# Patient Record
Sex: Male | Born: 1975 | Race: White | Hispanic: No | Marital: Single | Smoking: Never smoker
Health system: Southern US, Community
[De-identification: ages and names within clinical notes are randomized; demographics above are authoritative.]

## PROBLEM LIST (undated history)

## (undated) DIAGNOSIS — I2510 Atherosclerotic heart disease of native coronary artery without angina pectoris: Secondary | ICD-10-CM

## (undated) DIAGNOSIS — R569 Unspecified convulsions: Secondary | ICD-10-CM

## (undated) DIAGNOSIS — I10 Essential (primary) hypertension: Secondary | ICD-10-CM

## (undated) DIAGNOSIS — D496 Neoplasm of unspecified behavior of brain: Secondary | ICD-10-CM

## (undated) DIAGNOSIS — I219 Acute myocardial infarction, unspecified: Secondary | ICD-10-CM

## (undated) DIAGNOSIS — E785 Hyperlipidemia, unspecified: Secondary | ICD-10-CM

## (undated) HISTORY — DX: Unspecified convulsions: R56.9

## (undated) HISTORY — DX: Acute myocardial infarction, unspecified: I21.9

## (undated) HISTORY — DX: Hyperlipidemia, unspecified: E78.5

## (undated) HISTORY — PX: BRAIN SURGERY: SHX531

---

## 2005-07-20 ENCOUNTER — Ambulatory Visit (HOSPITAL_COMMUNITY): Admission: RE | Admit: 2005-07-20 | Discharge: 2005-07-20 | Payer: Self-pay | Admitting: Neurology

## 2010-04-02 ENCOUNTER — Encounter: Payer: Self-pay | Admitting: Neurology

## 2011-05-14 ENCOUNTER — Ambulatory Visit (INDEPENDENT_AMBULATORY_CARE_PROVIDER_SITE_OTHER): Payer: BC Managed Care – PPO | Admitting: Family Medicine

## 2011-05-14 ENCOUNTER — Ambulatory Visit: Payer: BC Managed Care – PPO

## 2011-05-14 VITALS — BP 130/80 | HR 120 | Temp 98.2°F | Resp 20 | Ht 65.5 in | Wt 183.0 lb

## 2011-05-14 DIAGNOSIS — R05 Cough: Secondary | ICD-10-CM

## 2011-05-14 DIAGNOSIS — R059 Cough, unspecified: Secondary | ICD-10-CM

## 2011-05-14 DIAGNOSIS — J189 Pneumonia, unspecified organism: Secondary | ICD-10-CM

## 2011-05-14 LAB — POCT CBC
Granulocyte percent: 75.2 %G (ref 37–80)
HCT, POC: 49.8 % (ref 43.5–53.7)
Lymph, poc: 2.4 (ref 0.6–3.4)
MCV: 87.4 fL (ref 80–97)
MID (cbc): 0.7 (ref 0–0.9)
MPV: 8.7 fL (ref 0–99.8)
POC MID %: 5.8 %M (ref 0–12)
Platelet Count, POC: 359 10*3/uL (ref 142–424)
RBC: 5.7 M/uL (ref 4.69–6.13)
WBC: 12.5 10*3/uL — AB (ref 4.6–10.2)

## 2011-05-14 MED ORDER — AZITHROMYCIN 500 MG PO TABS: 500.0000 mg | ORAL_TABLET | Freq: Every day | ORAL | Status: AC

## 2011-05-14 NOTE — Progress Notes (Signed)
Subjective:    Patient ID: Paul Duncan, male    DOB: [birth year], 36 y.o.   MRN: [id number]  HPI Paul Duncan is a 36 y.o. male Started with cold symptoms  1 week ago.- chest congestion, fever 100.5 - 101 next few days.,  Was feeling better past few days, then worse this am - more fatigue, chest congestion and cough.  No return of fever.  Breath a little shallower.  Tx: mucinex DM.  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  Last MRI 1/11.  Next appt. With radiation oncologist at [facility] March 20th. BP's usually in 140 range when has MRI, but no meds.  Engineer - Contractor.   Review of Systems  Constitutional: Positive for fever and chills.       Chills today, but last fever 6 days ago.  HENT: Negative for ear pain, congestion, sore throat, rhinorrhea, neck stiffness, postnasal drip, sinus pressure and ear discharge.        Slight decreased taste.  Respiratory: Positive for cough and chest tightness.        Chest congestion and tight with cough.  Cardiovascular: Negative for chest pain.  Gastrointestinal:       Occasional reflux after eating past few months.  Skin: Negative for rash.  Neurological: Negative for seizures, weakness and headaches.       Objective:   Physical Exam  Constitutional: He is oriented to person, place, and time. He appears well-developed and well-nourished.  HENT:  Head: Normocephalic and atraumatic.  Right Ear: External ear normal.  Left Ear: External ear normal.  Mouth/Throat: Oropharynx is clear and moist.  Eyes: Pupils are equal, round, and reactive to light.  Neck: Neck supple.  Cardiovascular: Regular rhythm, normal heart sounds and intact distal pulses.  Tachycardia present.   Pulmonary/Chest: Effort normal and breath sounds normal. No accessory muscle usage. Not tachypneic. No respiratory distress.  Abdominal: Soft. There is no tenderness.  Musculoskeletal: Normal range of motion.  Neurological: He is alert  and oriented to person, place, and time.  Skin: Skin is warm and dry.  Psychiatric: He has a normal mood and affect. His behavior is normal.   Results for orders placed in visit on 05/14/11  POCT CBC      Component Value Range   WBC 12.5 (*) 4.6 - 10.2 (K/uL)   Lymph, poc 2.4  0.6 - 3.4    POC LYMPH PERCENT 19.0  10 - 50 (%L)   MID (cbc) 0.7  0 - 0.9    POC MID % 5.8  0 - 12 (%M)   POC Granulocyte 9.4 (*) 2 - 6.9    Granulocyte percent 75.2  37 - 80 (%G)   RBC 5.70  4.69 - 6.13 (M/uL)   Hemoglobin 16.8  14.1 - 18.1 (g/dL)   HCT, POC 78.2  95.6 - 53.7 (%)   MCV 87.4  80 - 97 (fL)   MCH, POC 29.5  27 - 31.2 (pg)   MCHC 33.7  31.8 - 35.4 (g/dL)   RDW, POC 21.3     Platelet Count, POC 359  142 - 424 (K/uL)   MPV 8.7  0 - 99.8 (fL)     [facility] reading (PRIMARY) by  Dr. Neva Seat: CXR:  Increased left lingular, RML markings        Assessment & Plan:  Paul Duncan is a 36 y.o. male  Suspected L lingular pneumonia, after viral illness last week. Start azithro 500mg  qd x  5, mucinex 1-2 BID prn, fluids, rest,  rtc precautions discussed, including increased shortness of breath or fever in 3 days.    Recheck BP wnl.  likely white coat component.  Hx oligodendroglioma - s/p surgery 2007.  Denies recent headache, and no seizure activity.  Has follow up scheduled in few weeks.

## 2011-05-19 ENCOUNTER — Emergency Department (HOSPITAL_COMMUNITY): Payer: BC Managed Care – PPO

## 2011-05-19 ENCOUNTER — Encounter (HOSPITAL_COMMUNITY): Payer: Self-pay

## 2011-05-19 ENCOUNTER — Emergency Department (HOSPITAL_COMMUNITY)
Admission: EM | Admit: 2011-05-19 | Discharge: 2011-05-19 | Disposition: A | Discharge status: Home / Self Care | Payer: BC Managed Care – PPO | Attending: Emergency Medicine | Admitting: Emergency Medicine

## 2011-05-19 ENCOUNTER — Other Ambulatory Visit: Payer: Self-pay

## 2011-05-19 DIAGNOSIS — F411 Generalized anxiety disorder: Secondary | ICD-10-CM | POA: Insufficient documentation

## 2011-05-19 DIAGNOSIS — R079 Chest pain, unspecified: Secondary | ICD-10-CM | POA: Insufficient documentation

## 2011-05-19 DIAGNOSIS — F419 Anxiety disorder, unspecified: Secondary | ICD-10-CM

## 2011-05-19 HISTORY — DX: Neoplasm of unspecified behavior of brain: D49.6

## 2011-05-19 LAB — DIFFERENTIAL
Basophils Absolute: 0.1 10*3/uL (ref 0.0–0.1)
Basophils Relative: 0 % (ref 0–1)
Eosinophils Absolute: 0 10*3/uL (ref 0.0–0.7)
Monocytes Relative: 10 % (ref 3–12)
Neutro Abs: 9.8 10*3/uL — ABNORMAL HIGH (ref 1.7–7.7)
Neutrophils Relative %: 72 % (ref 43–77)

## 2011-05-19 LAB — CBC
MCH: 29.6 pg (ref 26.0–34.0)
MCHC: 36.2 g/dL — ABNORMAL HIGH (ref 30.0–36.0)
Platelets: 392 10*3/uL (ref 150–400)
RDW: 12.2 % (ref 11.5–15.5)

## 2011-05-19 LAB — BASIC METABOLIC PANEL
BUN: 15 mg/dL (ref 6–23)
Chloride: 103 mEq/L (ref 96–112)
GFR calc Af Amer: >90 mL/min (ref >90)
GFR calc non Af Amer: >90 mL/min (ref >90)
Potassium: 2.9 mEq/L — ABNORMAL LOW (ref 3.5–5.1)

## 2011-05-19 LAB — TROPONIN I: Troponin I: <0.3 ng/mL (ref <0.30)

## 2011-05-19 MED ORDER — ASPIRIN 81 MG PO CHEW
324.0000 mg | CHEWABLE_TABLET | Freq: Once | ORAL | Status: AC
  Administered 2011-05-19: 324 mg via ORAL
  Filled 2011-05-19: qty 4

## 2011-05-19 NOTE — Discharge Instructions (Signed)
Please read and follow all provided instructions.  Your diagnoses today include:  1. Anxiety   2. Chest pain     Tests performed today include:  An EKG of your heart  A chest x-ray  Cardiac enzymes - a blood test for heart muscle damage  Blood counts and electrolytes  Vital signs. See below for your results today.   Medications prescribed:  Start taking 81mg  aspirin a day if you can tolerate it and have not already done so.   Take any prescribed medications only as directed.  Follow-up instructions: Please follow-up with your primary care provider as soon as you can for further evaluation of your symptoms. If you do not have a primary care doctor -- see below for referral information.   Return instructions:  SEEK IMMEDIATE MEDICAL ATTENTION IF:  You have severe chest pain, especially if the pain is crushing or pressure-like and spreads to the arms, back, neck, or jaw, or if you have sweating, nausea (feeling sick to your stomach), or shortness of breath. THIS IS AN EMERGENCY. Don't wait to see if the pain will go away. Get medical help at once. Call 911 or 0 (operator). DO NOT drive yourself to the hospital.   Your chest pain gets worse and does not go away with rest.   You have an attack of chest pain lasting longer than usual, despite rest and treatment with the medications your caregiver has prescribed.   You wake from sleep with chest pain or shortness of breath.  You feel dizzy or faint.  You have chest pain not typical of your usual pain for which you originally saw your caregiver.   You have any other emergent concerns regarding your health.  Additional Information: Chest pain comes from many different causes. Your caregiver has diagnosed you as having chest pain that is not specific for one problem, but does not require admission.  You are at low risk for an acute heart condition or other serious illness.   Your vital signs today were: BP 122/77  Pulse 77   Temp(Src) 98.1 F (36.7 C) (Oral)  Resp 24  SpO2 99% If your blood pressure (BP) was elevated above 135/85 this visit, please have this repeated by your doctor within one month. -------------- No Primary Care Doctor Call [facility]  [phone] Other agencies that provide inexpensive medical care    [facility]  [phone]    [facility]  [phone]    [facility]  [phone]    [facility]  [phone]    [facility]  [phone]    [facility]  [phone] -------------- RESOURCE GUIDE:  Dental Problems  Patients with Medicaid: [facility] [phone]                                            [id number] [employer] Phone:  [phone]                                                   Phone:  [phone]  If unable to pay  or uninsured, contact:  [facility] or [facility]. to become qualified for the adult dental clinic.  Chronic Pain Problems Contact [facility]  [phone] Patients need to be referred by their primary care doctor.  Insufficient Money for Medicine Contact [employer]:  call "211" or [facility] [phone].  Psychological Services [facility]  [phone] [facility]  [phone] [facility]   [phone] (emergency services [phone])  Substance Abuse Resources [facility]  [phone] [facility] [phone] [facility] [phone] Floydene Flock [phone] Residential & Outpatient Substance Abuse Program  [phone]  Abuse/Neglect [facility] Child Abuse Hotline 6036699885 [facility] Child Abuse Hotline [phone] (After Hours)  Emergency Shelter [facility] [phone]  Maternity Homes [facility] [phone] [place] [facility] [phone]  [facility]  Resources  [facility]     [employer]                          [facility] Dept. [address] [place]                       [address]      [address]  Blondell Reveal Phone:  [phone]                                   Phone:  [phone]                 Phone:  [phone]  [facility] Mental Health Phone:  [phone]  [facility] Child Abuse Hotline [phone] (After Hours)

## 2011-05-19 NOTE — ED Provider Notes (Signed)
History     CSN: [id number]  Arrival date & time 05/19/11  4098   First MD Initiated Contact with Patient 05/19/11 1836      Chief Complaint  Patient presents with  . Anxiety    (Consider location/radiation/quality/duration/timing/severity/associated sxs/prior treatment) Patient is a 36 y.o. male presenting with anxiety. The history is provided by the patient.  Anxiety   he was watching television at 1730 when he developed a warm feeling in the left side of his chest associated with a sense of his heart was racing and some numbness in his left arm and leg. He denies any right-sided numbness. There was some dry mouth. He denies any facial numbness. Symptoms lasted about 30 minutes before resolving. Nothing made it better nothing made it worse. He did not try any treatment other than drinking some water. He rates the discomfort in his chest at 2/10 at its worst. He is symptom-free at this point. He denies dyspnea, nausea, vomiting, diaphoresis. He has been on azithromycin for 5 days for pneumonia and took his last dose yesterday. He continues to have a slight cough, but no fever or chills. His only cardiac risk factor is family history in that his father had coronary artery bypass surgery when he was 59. Patient is a nonsmoker and denies history of hypertension, diabetes, hyperlipidemia.   Past Medical History  Diagnosis Date  . Brain tumor     Past Surgical History  Procedure Date  . Brain surgery     History reviewed. No pertinent family history.  History  Substance Use Topics  . Smoking status: Never Smoker   . Smokeless tobacco: Not on file  . Alcohol Use: No      Review of Systems  All other systems reviewed and are negative.    Allergies  Review of patient's allergies indicates no known allergies.  Home Medications  No current outpatient prescriptions on file.  BP 147/80  Temp(Src) 98.1 F (36.7 C) (Oral)  Resp 24  SpO2 99%  Physical Exam  Nursing note  and vitals reviewed.  36 year old male who is resting comfortably and in no acute distress. Vital signs show mild systolic hypertension with blood pressure 147/80, and mild tachypnea with respiratory rate of 24. Oxygen saturation is 99% which is normal. Head is normocephalic and atraumatic. PERRLA, EOMI. Oropharynx is clear. Neck is nontender and supple without adenopathy or JVD. Lungs are clear without rales, wheezes, rhonchi. Back is nontender. Heart has regular rate rhythm without murmur. Abdomen is soft, flat, nontender without masses or hepatomegaly impression loss is present. Extremities have full range of motion, no cyanosis or edema. Skin is warm and dry without rash. Neurologic: Mental status is normal, cranial nerves are intact, there no focal motor or sensory deficits.  ED Course  Procedures (including critical care time)  Results for orders placed during the hospital encounter of 05/19/11  CBC      Component Value Range   WBC 13.6 (*) 4.0 - 10.5 (K/uL)   RBC 5.85 (*) 4.22 - 5.81 (MIL/uL)   Hemoglobin 17.3 (*) 13.0 - 17.0 (g/dL)   HCT 11.9  14.7 - 82.9 (%)   MCV 81.7  78.0 - 100.0 (fL)   MCH 29.6  26.0 - 34.0 (pg)   MCHC 36.2 (*) 30.0 - 36.0 (g/dL)   RDW 56.2  13.0 - 86.5 (%)   Platelets 392  150 - 400 (K/uL)  DIFFERENTIAL      Component Value Range   Neutrophils Relative 72  43 - 77 (%)   Neutro Abs 9.8 (*) 1.7 - 7.7 (K/uL)   Lymphocytes Relative 18  12 - 46 (%)   Lymphs Abs 2.4  0.7 - 4.0 (K/uL)   Monocytes Relative 10  3 - 12 (%)   Monocytes Absolute 1.3 (*) 0.1 - 1.0 (K/uL)   Eosinophils Relative 0  0 - 5 (%)   Eosinophils Absolute 0.0  0.0 - 0.7 (K/uL)   Basophils Relative 0  0 - 1 (%)   Basophils Absolute 0.1  0.0 - 0.1 (K/uL)  BASIC METABOLIC PANEL      Component Value Range   Sodium 140  135 - 145 (mEq/L)   Potassium 2.9 (*) 3.5 - 5.1 (mEq/L)   Chloride 103  96 - 112 (mEq/L)   CO2 21  19 - 32 (mEq/L)   Glucose, Bld 96  70 - 99 (mg/dL)   BUN 15  6 - 23 (mg/dL)     Creatinine, Ser 1.61  0.50 - 1.35 (mg/dL)   Calcium 9.4  8.4 - 09.6 (mg/dL)   GFR calc non Af Amer >90  >90 (mL/min)   GFR calc Af Amer >90  >90 (mL/min)  TROPONIN I      Component Value Range   Troponin I <0.30  <0.30 (ng/mL)  TROPONIN I      Component Value Range   Troponin I <0.30  <0.30 (ng/mL)   Dg Chest 2 View  05/19/2011  *RADIOLOGY REPORT*  Clinical Data: Chest pain.  Tachycardia.  Anxiety.  CHEST - 2 VIEW  Comparison: None.  Findings: Normal sized heart.  Clear lungs.  Mild diffuse peribronchial thickening.  Minimal scoliosis and minimal thoracic spine degenerative changes.  IMPRESSION: Mild chronic bronchitic changes.  Original Report Authenticated By: Darrol Angel, M.D.     ECG shows normal sinus rhythm with a rate of 83, no ectopy. Normal axis. Normal P wave. Normal QRS. Normal intervals. Normal ST and T waves. Impression: Poor R-wave progression across the precordium, otherwise normal ECG. No old ECG for available for comparison.  He has been symptom-free in the emergency department. He will be sent to CDU to await a three-hour troponin and discharged if negative.  1. Anxiety   2. Chest pain       MDM  Chest discomfort and tachycardia with numbness-I suspect that this was an episode of hyperventilation. He has a history of having an  oligodendroglioma removed from the right side of his brain about 5 years ago. I doubt significant cardiac disease. Cardiac markers will be checked and repeated in 3 hours.        Dione Booze, MD 05/20/11 1452

## 2011-05-19 NOTE — ED Provider Notes (Signed)
History     CSN: [id number]  Arrival date & time 05/19/11  8295   First MD Initiated Contact with Patient 05/19/11 1836      Chief Complaint  Patient presents with  . Anxiety    (Consider location/radiation/quality/duration/timing/severity/associated sxs/prior treatment) HPI  Past Medical History  Diagnosis Date  . Brain tumor     Past Surgical History  Procedure Date  . Brain surgery     History reviewed. No pertinent family history.  History  Substance Use Topics  . Smoking status: Never Smoker   . Smokeless tobacco: Not on file  . Alcohol Use: No      Review of Systems  Allergies  Review of patient's allergies indicates no known allergies.  Home Medications  No current outpatient prescriptions on file.  BP 145/96  Pulse 79  Temp(Src) 98.1 F (36.7 C) (Oral)  Resp 24  SpO2 100%  Physical Exam  ED Course  Procedures (including critical care time)  Labs Reviewed  CBC - Abnormal; Notable for the following:    WBC 13.6 (*)    RBC 5.85 (*)    Hemoglobin 17.3 (*)    MCHC 36.2 (*)    All other components within normal limits  DIFFERENTIAL - Abnormal; Notable for the following:    Neutro Abs 9.8 (*)    Monocytes Absolute 1.3 (*)    All other components within normal limits  BASIC METABOLIC PANEL - Abnormal; Notable for the following:    Potassium 2.9 (*)    All other components within normal limits  TROPONIN I  TROPONIN I   Dg Chest 2 View  05/19/2011  *RADIOLOGY REPORT*  Clinical Data: Chest pain.  Tachycardia.  Anxiety.  CHEST - 2 VIEW  Comparison: None.  Findings: Normal sized heart.  Clear lungs.  Mild diffuse peribronchial thickening.  Minimal scoliosis and minimal thoracic spine degenerative changes.  IMPRESSION: Mild chronic bronchitic changes.  Original Report Authenticated By: Darrol Angel, M.D.     1. Anxiety   2. Chest pain     7:25 PM patient to CDU from the blue side -- is awaiting 3 hour troponin. Patient had an episode of  hyperventilation and had some left sided facial and arm numbness with chest pain. Recently treated for pneumonia. Plan: Patient is low risk for ACS and can be discharged to home if second troponin is negative. Patient will need PCP referrals.  7:59 PM patient seen and examined. No history of hypertension, hypercholesterolemia, diabetes, smoking. Patient's father had bypass surgery at age 19. Currently no chest pain. Episode of chest pain lasting 20-30 minutes and then resolved. Exam:  Gen NAD; Heart RRR, nml S1,S2, no m/r/g; Lungs CTAB; Abd soft, NT, no rebound or guarding; Ext 2+ pedal pulses bilaterally, no edema.  11:11 PM Two sets of cardiac enzymes are negative. Patient informed. Patient counseled to begin taking 81 mg baby aspirin daily.  Primary care referrals given. Urged followup with primary care physician as soon as possible.  Patient was counseled to return with severe chest pain, especially if the pain is crushing or pressure-like and spreads to the arms, back, neck, or jaw, or if they have sweating, nausea, or shortness of breath with the pain. They were encouraged to call 911 with these symptoms.   They were also told to return if their chest pain gets worse and does not go away with rest, they have an attack of chest pain lasting longer than usual despite rest and treatment with the  medications their caregiver has prescribed, if they wake from sleep with chest pain or shortness of breath, if they feel dizzy or faint, if they have chest pain not typical of their usual pain, or if they have any other emergent concerns regarding their health.  The patient verbalized understanding and agreed.   MDM  Low risk chest pain, work-up is benign. PCP follow-up indicated.         Renne Crigler, [place] 05/19/11 2314

## 2011-05-19 NOTE — ED Notes (Signed)
Pt was watching TV and felt warmness in left side and numbness in arms and face.  Has been on z-pack x 1 week for possible dx of pneumonia.  Pt alert, anxious on arrival to ED

## 2011-05-19 NOTE — ED Notes (Signed)
Patient is resting comfortably. 

## 2011-05-19 NOTE — ED Notes (Signed)
Pt returned from xray

## 2011-05-20 NOTE — ED Provider Notes (Signed)
Mr. Paul Duncan was only involved for their CDU care the patient. I was available for consultation at all times.  Dione Booze, MD 05/20/11 [phone]

## 2011-09-03 DIAGNOSIS — D496 Neoplasm of unspecified behavior of brain: Secondary | ICD-10-CM | POA: Insufficient documentation

## 2012-04-25 ENCOUNTER — Emergency Department: Payer: Self-pay | Admitting: Emergency Medicine

## 2012-08-18 ENCOUNTER — Ambulatory Visit (INDEPENDENT_AMBULATORY_CARE_PROVIDER_SITE_OTHER): Payer: Managed Care, Other (non HMO) | Admitting: Physician Assistant

## 2012-08-18 VITALS — BP 142/86 | HR 131 | Temp 98.4°F | Resp 18 | Ht 66.0 in | Wt 205.2 lb

## 2012-08-18 DIAGNOSIS — S30860A Insect bite (nonvenomous) of lower back and pelvis, initial encounter: Secondary | ICD-10-CM

## 2012-08-18 DIAGNOSIS — S30861A Insect bite (nonvenomous) of abdominal wall, initial encounter: Secondary | ICD-10-CM

## 2012-08-18 DIAGNOSIS — W57XXXA Bitten or stung by nonvenomous insect and other nonvenomous arthropods, initial encounter: Secondary | ICD-10-CM

## 2012-08-18 MED ORDER — DOXYCYCLINE HYCLATE 100 MG PO CAPS: 100.0000 mg | ORAL_CAPSULE | Freq: Two times a day (BID) | ORAL | Status: DC

## 2012-08-18 NOTE — Patient Instructions (Addendum)
Wash tick bite area daily with soap and warm water. Apply antibiotic ointment daily.  Take doxycycline as directed, finish the entire 10 day course of antibiotics.  Take Benadryl at night for itching as needed.  Take Zyrtec or Allegra during the day for itching as needed.  When your lab work results are ready, we will notify you and adjust medications if needed.

## 2012-08-18 NOTE — Progress Notes (Signed)
  Subjective:    Patient ID: Paul Duncan, male    DOB: 1975-10-01, 37 y.o.   MRN: [id number]  HPI  Presents with tick bite x 9 days. Pt states 9 days ago he was in a tick infested area and did remove a tick from his foot following the event. In the shower 7 days ago he noticed a tick-like object on his left lower abdomen which he aggressively brushed off. 2 days ago he noticed a pruritic erythematous rash in the same location. Denies fever, chills, dizziness, headache, nausea, vomiting.   Review of Systems Denies: SOB, chest pain, urinary symptoms, numbness/tingling in hands/feet.    Objective:   Physical Exam  General: WDWN male in no acute distress, appears stated age Resp: clear to auscultation anterior/posterior fields bilaterally, no rales, rhonchi, wheezes Cardiac: RRR, no murmurs/rubs/gallops  Abdomen: normal bowel sounds, soft, non distended, non tender to palpation  Extremities: moves all limbs spontaneously  Neuro: alert & oriented x 3, cranial nerves II-XII grossly intact Skin: 7mm circumferential erythematous rash on left lower abdomen with bite in central area, non tender to palpation      Assessment & Plan:   Tick bite of abdomen, initial encounter - Plan: [place] spotted fvr ab, IgM-blood, doxycycline (VIBRAMYCIN) 100 MG capsule  Doxycycline x 10 days- will increase duration of antibiotic if RMSF titer is elevated. Benadryl for itching at night, Zyrtec or Allegra for itching during the day. Antibiotic ointment once daily after washing with warm water and soap.

## 2012-08-18 NOTE — Progress Notes (Signed)
I have examined this patient along with the student and agree.  

## 2013-04-26 IMAGING — CR DG CHEST 2V
2 series · 2 of 2 positions shown · non-contrast
Comparison: None.

CLINICAL DATA: Chest congestion.  Cough.

CHEST - 2 VIEW

[PA]
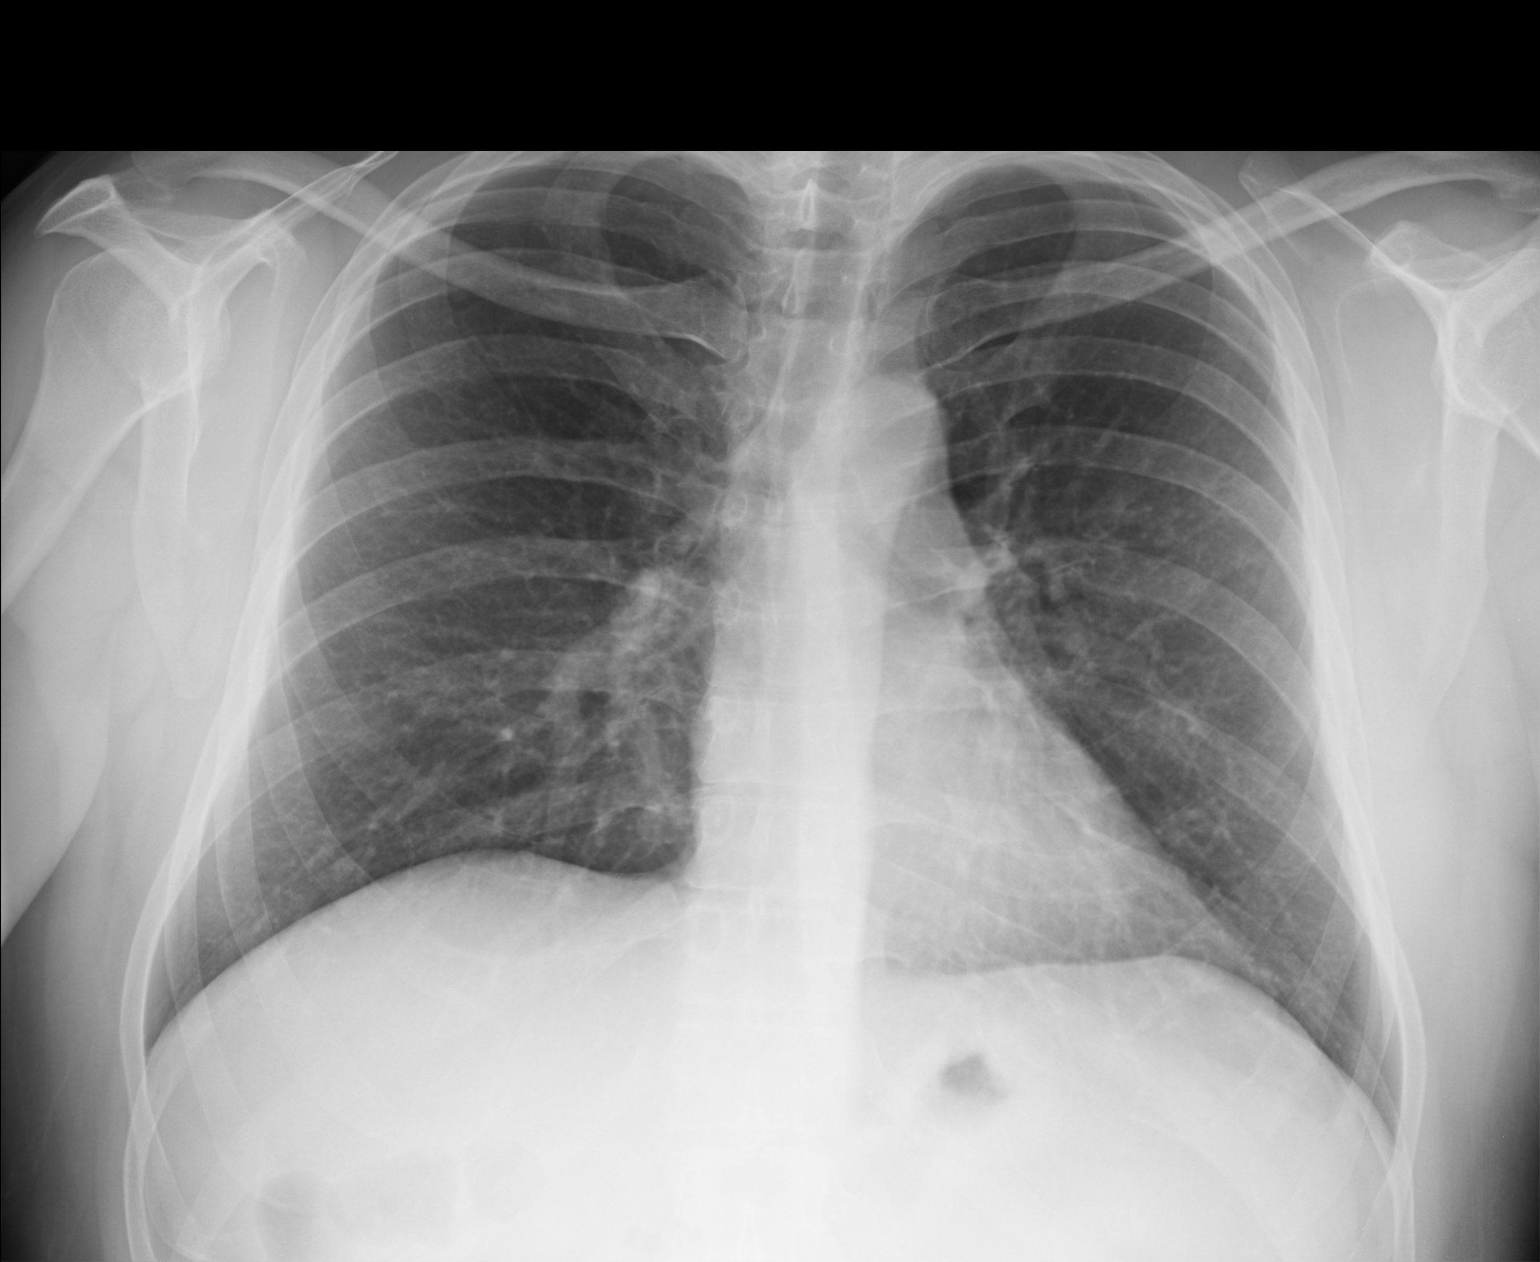

[lateral]
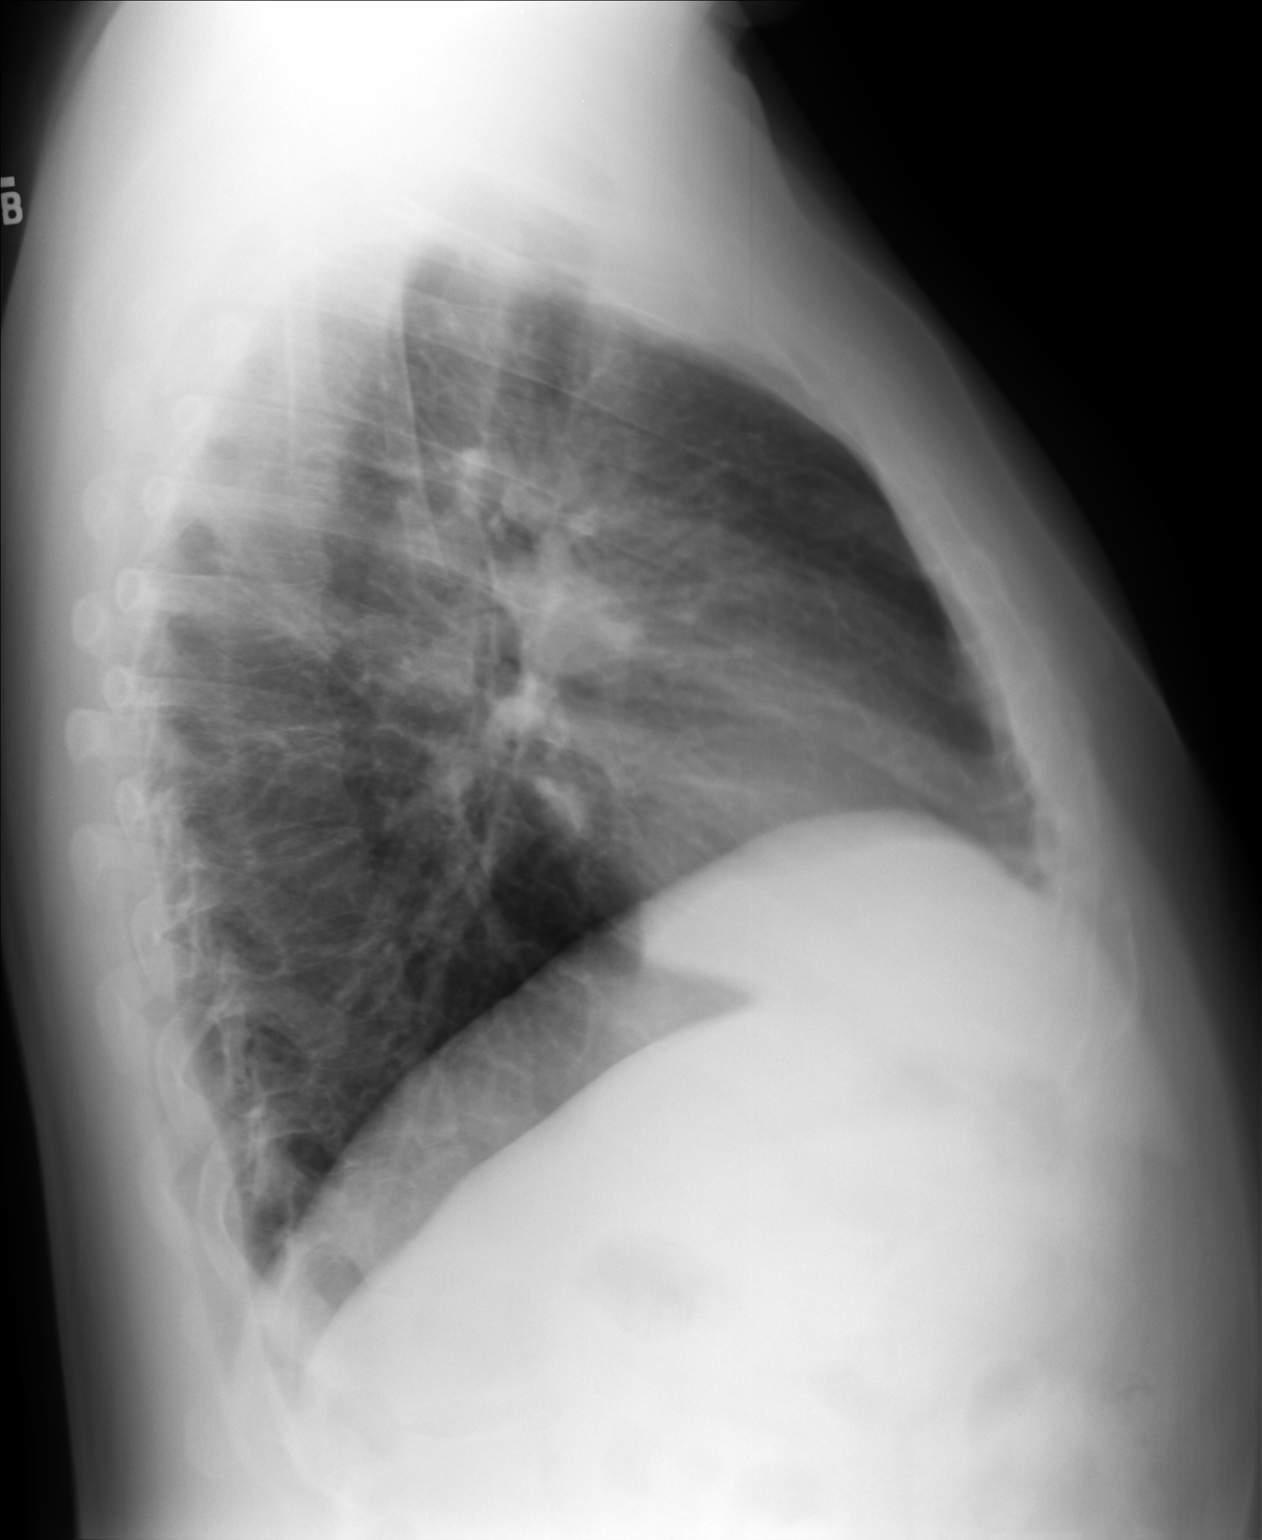

[2 of 2 positions shown; findings below may reference images not displayed]

FINDINGS: Normal heart size.  Clear lungs.  No pneumothorax and no
pleural effusion.  Interstitial prominence.  Bronchitic changes.
IMPRESSION: No active cardiopulmonary disease.  Chronic changes.

## 2014-06-28 ENCOUNTER — Ambulatory Visit: Payer: Worker's Compensation

## 2014-06-28 ENCOUNTER — Ambulatory Visit (INDEPENDENT_AMBULATORY_CARE_PROVIDER_SITE_OTHER): Payer: Worker's Compensation | Admitting: Family Medicine

## 2014-06-28 VITALS — BP 150/90 | HR 90 | Temp 98.2°F | Resp 18 | Ht 66.0 in | Wt 214.4 lb

## 2014-06-28 DIAGNOSIS — S60031A Contusion of right middle finger without damage to nail, initial encounter: Secondary | ICD-10-CM

## 2014-06-28 DIAGNOSIS — S6991XA Unspecified injury of right wrist, hand and finger(s), initial encounter: Secondary | ICD-10-CM

## 2014-06-28 DIAGNOSIS — M79644 Pain in right finger(s): Secondary | ICD-10-CM | POA: Diagnosis not present

## 2014-06-28 NOTE — Progress Notes (Signed)
BUDD FREIERMUTH Jul 16, 1975 39 y.o.   Chief Complaint  Patient presents with  . workers comp injury    right thumb pain      Date of Injury: today  History of Present Illness:  Presents for evaluation of work-related complaint. He was pushing a cart today and pinched his right hand between the cart and the wall. It hurt his fingers a bit but he did not think too much of it. However a couple of hours later he noted that he end of his right long finger was bruised and swollen.  It does not hurt much unless he tries to bend the finger.  No other injury today  ROS  Otherwise he is feeling well today.  He is generally in good health He is a Haematologist  No Known Allergies   Current medications reviewed and updated. Past medical history, family history, social history have been reviewed and updated.   Physical Exam  GEN: WDWN, NAD, Non-toxic, A & O x 3 HEENT: Atraumatic, Normocephalic. Neck supple. No masses, No LAD. Ears and Nose: No external deformity. CV: RRR, No M/G/R. No JVD. No thrill. No extra heart sounds. PULM: CTA B, no wheezes, crackles, rhonchi. No retractions. No resp. distress. No accessory muscle use. ABD: S, NT, ND, +BS. No rebound. No HSM. EXTR: No c/c/e NEURO Normal gait.  PSYCH: Normally interactive. Conversant. Not depressed or anxious appearing.  Calm demeanor.  Right hand: he has swelling and bruising (of the pad) of the distal right long finger.  Normal cap refill.  Normal sensation of the finger including the pad. He has pain with flexion and extension of the finger.  Otherwise the right hand and wrist is negative for tenderness or other apparent injury  [facility] reading (PRIMARY) by  Dr. Lorelei Pont. Right hand: negative for fracture  Assessment and Plan: Injury of right hand, initial encounter - Plan: DG Hand Complete Right  Pain of right middle finger - Plan: DG Hand Complete Right  Contusion of right middle finger without damage to nail,  initial encounter  Contusion of right middle finger- crush type injury.  Placed in a fingertip splint for protection.  Cautioned regarding signs of worsening.  He feels that he can do full duty and will follow-up if needed  See patient instructions for more details.

## 2014-06-28 NOTE — Patient Instructions (Signed)
Your finger seems to be just bruised.  Try ice and ibuprofen as needed You can wear the finger tip splint as needed for protection If your finger is getting worse, going numb or hurting more please let me know right away Otherwise you do not have to come back and see [place] if you are improving

## 2014-09-08 DIAGNOSIS — C719 Malignant neoplasm of brain, unspecified: Secondary | ICD-10-CM | POA: Insufficient documentation

## 2015-08-17 ENCOUNTER — Emergency Department
Admission: EM | Admit: 2015-08-17 | Discharge: 2015-08-17 | Disposition: A | Discharge status: Home / Self Care | Payer: 59 | Attending: Emergency Medicine | Admitting: Emergency Medicine

## 2015-08-17 DIAGNOSIS — Z8669 Personal history of other diseases of the nervous system and sense organs: Secondary | ICD-10-CM | POA: Insufficient documentation

## 2015-08-17 DIAGNOSIS — Y999 Unspecified external cause status: Secondary | ICD-10-CM | POA: Insufficient documentation

## 2015-08-17 DIAGNOSIS — W260XXA Contact with knife, initial encounter: Secondary | ICD-10-CM | POA: Diagnosis not present

## 2015-08-17 DIAGNOSIS — Y929 Unspecified place or not applicable: Secondary | ICD-10-CM | POA: Diagnosis not present

## 2015-08-17 DIAGNOSIS — Y939 Activity, unspecified: Secondary | ICD-10-CM | POA: Insufficient documentation

## 2015-08-17 DIAGNOSIS — I10 Essential (primary) hypertension: Secondary | ICD-10-CM | POA: Insufficient documentation

## 2015-08-17 DIAGNOSIS — S61211A Laceration without foreign body of left index finger without damage to nail, initial encounter: Secondary | ICD-10-CM | POA: Diagnosis present

## 2015-08-17 HISTORY — DX: Essential (primary) hypertension: I10

## 2015-08-17 NOTE — ED Provider Notes (Signed)
[facility] Emergency Department Provider Note  ____________________________________________  Time seen: Approximately 10:11 PM  I have reviewed the triage vital signs and the nursing notes.   HISTORY  Chief Complaint Laceration    HPI Paul Duncan is a 40 y.o. male, NAD, presents to the emergency department with complaint of laceration to the left 2nd digit. He states that he was using a knife to open a bag when it slipped and cut his finger. Applied pressure for hemostasis with gauze and tape. He denies any pain, numbness, nor parasthesias. Reports he has normal ROM and came in because he was concerned about the bleeding that has now subsided. No measures taken for pain control. Received Tetanus shot last month during a generally well visit.   Past Medical History  Diagnosis Date  . Brain tumor ([place])   . Seizures ([place])   . Hypertension     There are no active problems to display for this patient.   Past Surgical History  Procedure Laterality Date  . Brain surgery    . Brain surgery  [id number]    No current outpatient prescriptions on file.  Allergies Review of patient's allergies indicates no known allergies.  Family History  Problem Relation Age of Onset  . Diabetes Mother   . Heart disease Father     Social History Social History  Substance Use Topics  . Smoking status: Never Smoker   . Smokeless tobacco: None  . Alcohol Use: No     Review of Systems  Constitutional: No fatigue Eyes: No visual changes.  Cardiovascular: No chest pain. Respiratory:  No shortness of breath. Musculoskeletal: Negative for Left hand pain.  Skin: Positive laceration to left index finger. Negative for rash, bruising. Neurological: Negative for headaches, focal weakness or numbness. No tingling 10-point ROS otherwise negative.  ____________________________________________   PHYSICAL EXAM:  VITAL SIGNS: ED Triage Vitals  Enc Vitals Group   BP 08/17/15 2119 144/76 mmHg     Pulse Rate 08/17/15 2119 101     Resp 08/17/15 2119 18     Temp 08/17/15 2119 98.2 F (36.8 C)     Temp Source 08/17/15 2119 Oral     SpO2 08/17/15 2119 98 %     Weight 08/17/15 2119 198 lb (89.812 kg)     Height 08/17/15 2119 5\' 5"  (1.651 m)     Head Cir --      Peak Flow --      Pain Score --      Pain Loc --      Pain Edu? --      Excl. in [place]? --      Constitutional: Alert and oriented. Well appearing and in no acute distress. Eyes: Conjunctivae are normal.  Head: Atraumatic. Cardiovascular: Good peripheral circulation with 2+ pulses noted in the left upper extremity. Capillary refill is brisk in all digits of the left hand. Respiratory: Normal respiratory effort without tachypnea or retractions.  Musculoskeletal: FROM of left and right fingers and wrists without pain, Strength 5/5 in left 1st, 2nd, and 3rd digits Neurologic:  Normal speech and language. No gross focal neurologic deficits are appreciated. Sensation to light touch is grossly intact about the left index finger. Skin:  1 cm superficial laceration to the left index finger. Bleeding is controlled. Skin is warm, dry and intact. No rash noted. Psychiatric: Mood and affect are normal. Speech and behavior are normal. Patient exhibits appropriate insight and judgement.   ____________________________________________   Reva Bores  None ____________________________________________  EKG  None ____________________________________________  RADIOLOGY  None ____________________________________________    PROCEDURES  Procedure(s) performed: LACERATION REPAIR Performed by: Braxton Feathers Authorized by: Braxton Feathers Consent: Verbal consent obtained. Risks and benefits: risks, benefits and alternatives were discussed Consent given by: patient Patient identity confirmed: provided demographic data  Laceration Location: Left second finger  Laceration Length: 1.0 cm  No Foreign Bodies  seen or palpated  Local anesthetic: None  Irrigation method: syringe Amount of cleaning: standard  Skin closure: Steri strips  Patient tolerance: Patient tolerated the procedure well with no immediate complications.      Medications - No data to display   ____________________________________________   INITIAL IMPRESSION / ASSESSMENT AND PLAN / ED COURSE  Patient's diagnosis is consistent with laceration of left index finger without foreign body or damage to the nail. Patient will be discharged home with instructions for home wound care. Patient is to follow up with his primary care provider for wound recheck as needed. Patient is given ED precautions to return to the ED for any worsening or new symptoms.    ____________________________________________  FINAL CLINICAL IMPRESSION(S) / ED DIAGNOSES  Final diagnoses:  Laceration of left index finger w/o foreign body w/o damage to nail, initial encounter      NEW MEDICATIONS STARTED DURING THIS VISIT:  New Prescriptions   No medications on file        Braxton Feathers, PA-C 08/17/15 2308  Delman Kitten, MD 08/18/15 0009

## 2015-08-17 NOTE — ED Notes (Signed)
See triage note.. Pt a/ox4, NAD.  Sts that he cut L index w/ knife. Bleeding controlled.

## 2015-08-17 NOTE — Discharge Instructions (Signed)
Stitches, Staples, or Adhesive Wound Closure  °Health care providers use stitches (sutures), staples, and certain glue (skin adhesives) to hold skin together while it heals (wound closure). You may need this treatment after you have surgery or if you cut your skin accidentally. These methods help your skin to heal more quickly and make it less likely that you will have a scar. A wound may take several months to heal completely.  °The type of wound you have determines when your wound gets closed. In most cases, the wound is closed as soon as possible (primary skin closure). Sometimes, closure is delayed so the wound can be cleaned and allowed to heal naturally. This reduces the chance of infection. Delayed closure may be needed if your wound:  °Is caused by a bite.  °Happened more than 6 hours ago.  °Involves loss of skin or the tissues under the skin.  °Has dirt or debris in it that cannot be removed.  °Is infected. °WHAT ARE THE DIFFERENT KINDS OF WOUND CLOSURES?  °There are many options for wound closure. The one that your health care provider uses depends on how deep and how large your wound is.  °Adhesive Glue  °To use this type of glue to close a wound, your health care provider holds the edges of the wound together and paints the glue on the surface of your skin. You may need more than one layer of glue. Then the wound may be covered with a light bandage (dressing).  °This type of skin closure may be used for small wounds that are not deep (superficial). Using glue for wound closure is less painful than other methods. It does not require a medicine that numbs the area (local anesthetic). This method also leaves nothing to be removed. Adhesive glue is often used for children and on facial wounds.  °Adhesive glue cannot be used for wounds that are deep, uneven, or bleeding. It is not used inside of a wound.  °Adhesive Strips  °These strips are made of sticky (adhesive), porous paper. They are applied across your  skin edges like a regular adhesive bandage. You leave them on until they fall off.  °Adhesive strips may be used to close very superficial wounds. They may also be used along with sutures to improve the closure of your skin edges.  °Sutures  °Sutures are the oldest method of wound closure. Sutures can be made from natural substances, such as silk, or from synthetic materials, such as nylon and steel. They can be made from a material that your body can break down as your wound heals (absorbable), or they can be made from a material that needs to be removed from your skin (nonabsorbable). They come in many different strengths and sizes.  °Your health care provider attaches the sutures to a steel needle on one end. Sutures can be passed through your skin, or through the tissues beneath your skin. Then they are tied and cut. Your skin edges may be closed in one continuous stitch or in separate stitches.  °Sutures are strong and can be used for all kinds of wounds. Absorbable sutures may be used to close tissues under the skin. The disadvantage of sutures is that they may cause skin reactions that lead to infection. Nonabsorbable sutures need to be removed.  °Staples  °When surgical staples are used to close a wound, the edges of your skin on both sides of the wound are brought close together. A staple is placed across the wound, and   an instrument secures the edges together. Staples are often used to close surgical cuts (incisions).  °Staples are faster to use than sutures, and they cause less skin reaction. Staples need to be removed using a tool that bends the staples away from your skin.  °HOW DO I CARE FOR MY WOUND CLOSURE?  °Take medicines only as directed by your health care provider.  °If you were prescribed an antibiotic medicine for your wound, finish it all even if you start to feel better.  °Use ointments or creams only as directed by your health care provider.  °Wash your hands with soap and water before and  after touching your wound.  °Do not soak your wound in water. Do not take baths, swim, or use a hot tub until your health care provider approves.  °Ask your health care provider when you can start showering. Cover your wound if directed by your health care provider.  °Do not take out your own sutures or staples.  °Do not pick at your wound. Picking can cause an infection.  °Keep all follow-up visits as directed by your health care provider. This is important. °HOW LONG WILL I HAVE MY WOUND CLOSURE?  °Leave adhesive glue on your skin until the glue peels away.  °Leave adhesive strips on your skin until the strips fall off.  °Absorbable sutures will dissolve within several days.  °Nonabsorbable sutures and staples must be removed. The location of the wound will determine how long they stay in. This can range from several days to a couple of weeks. °WHEN SHOULD I SEEK HELP FOR MY WOUND CLOSURE?  °Contact your health care provider if:  °You have a fever.  °You have chills.  °You have drainage, redness, swelling, or pain at your wound.  °There is a bad smell coming from your wound.  °The skin edges of your wound start to separate after your sutures have been removed.  °Your wound becomes thick, raised, and darker in color after your sutures come out (scarring). °This information is not intended to replace advice given to you by your health care provider. Make sure you discuss any questions you have with your health care provider.  °Document Released: 11/21/2000 Document Revised: 03/19/2014 Document Reviewed: 08/05/2013  °Elsevier Interactive Patient Education ©2016 [employer]  ° °

## 2015-08-17 NOTE — ED Notes (Signed)
Pt arrives to ER via POV c/o left hand 2nd digit laceration with knife at home. Bleeding controlled. Pt alert and oriented X4, active, cooperative, pt in NAD. RR even and unlabored, color WNL.

## 2015-08-17 NOTE — ED Notes (Signed)
E sig pad unavailable, pt verbalized understanding 

## 2015-09-17 ENCOUNTER — Ambulatory Visit (INDEPENDENT_AMBULATORY_CARE_PROVIDER_SITE_OTHER): Payer: 59 | Admitting: Internal Medicine

## 2015-09-17 VITALS — BP 140/92 | HR 108 | Temp 98.2°F | Resp 16 | Ht 66.0 in | Wt 209.2 lb

## 2015-09-17 DIAGNOSIS — L03113 Cellulitis of right upper limb: Secondary | ICD-10-CM | POA: Diagnosis not present

## 2015-09-17 DIAGNOSIS — T2210XA Burn of first degree of shoulder and upper limb, except wrist and hand, unspecified site, initial encounter: Secondary | ICD-10-CM

## 2015-09-17 MED ORDER — CEPHALEXIN 500 MG PO CAPS: 500.0000 mg | ORAL_CAPSULE | Freq: Four times a day (QID) | ORAL | Status: DC

## 2015-09-17 MED ORDER — MUPIROCIN 2 % EX OINT: TOPICAL_OINTMENT | CUTANEOUS | Status: DC
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
## 2015-09-17 NOTE — Patient Instructions (Signed)
     IF you received an x-ray today, you will receive an invoice from [facility]. Please contact [facility] at [phone] with questions or concerns regarding your invoice.   IF you received labwork today, you will receive an invoice from [facility]. Please contact [facility] at [phone] with questions or concerns regarding your invoice.   Our billing staff will not be able to assist you with questions regarding bills from these companies.  You will be contacted with the lab results as soon as they are available. The fastest way to get your results is to activate your My Chart account. Instructions are located on the last page of this paperwork. If you have not heard from us regarding the results in 2 weeks, please contact this office.      

## 2017-12-18 ENCOUNTER — Encounter (HOSPITAL_COMMUNITY): Payer: Self-pay | Admitting: Emergency Medicine

## 2017-12-18 ENCOUNTER — Inpatient Hospital Stay (HOSPITAL_COMMUNITY)
Admission: EM | Admit: 2017-12-18 | Discharge: 2018-01-01 | DRG: 233 | Disposition: A | Discharge status: Home / Self Care | Payer: Managed Care, Other (non HMO) | Attending: Cardiothoracic Surgery | Admitting: Cardiothoracic Surgery

## 2017-12-18 ENCOUNTER — Emergency Department (HOSPITAL_COMMUNITY): Payer: Managed Care, Other (non HMO)

## 2017-12-18 ENCOUNTER — Encounter (HOSPITAL_COMMUNITY)
Admission: EM | Disposition: A | Discharge status: Home / Self Care | Payer: Self-pay | Source: Home / Self Care | Attending: Cardiothoracic Surgery

## 2017-12-18 ENCOUNTER — Other Ambulatory Visit: Payer: Self-pay | Admitting: *Deleted

## 2017-12-18 ENCOUNTER — Other Ambulatory Visit: Payer: Self-pay

## 2017-12-18 ENCOUNTER — Observation Stay (HOSPITAL_COMMUNITY): Payer: Managed Care, Other (non HMO)

## 2017-12-18 DIAGNOSIS — J9811 Atelectasis: Secondary | ICD-10-CM | POA: Diagnosis not present

## 2017-12-18 DIAGNOSIS — E785 Hyperlipidemia, unspecified: Secondary | ICD-10-CM | POA: Diagnosis present

## 2017-12-18 DIAGNOSIS — Z09 Encounter for follow-up examination after completed treatment for conditions other than malignant neoplasm: Secondary | ICD-10-CM

## 2017-12-18 DIAGNOSIS — E7800 Pure hypercholesterolemia, unspecified: Secondary | ICD-10-CM | POA: Diagnosis not present

## 2017-12-18 DIAGNOSIS — Z9689 Presence of other specified functional implants: Secondary | ICD-10-CM

## 2017-12-18 DIAGNOSIS — I2510 Atherosclerotic heart disease of native coronary artery without angina pectoris: Secondary | ICD-10-CM

## 2017-12-18 DIAGNOSIS — I214 Non-ST elevation (NSTEMI) myocardial infarction: Secondary | ICD-10-CM

## 2017-12-18 DIAGNOSIS — J90 Pleural effusion, not elsewhere classified: Secondary | ICD-10-CM

## 2017-12-18 DIAGNOSIS — D62 Acute posthemorrhagic anemia: Secondary | ICD-10-CM | POA: Diagnosis not present

## 2017-12-18 DIAGNOSIS — Z8249 Family history of ischemic heart disease and other diseases of the circulatory system: Secondary | ICD-10-CM | POA: Diagnosis not present

## 2017-12-18 DIAGNOSIS — S15391A Other specified injury of right internal jugular vein, initial encounter: Secondary | ICD-10-CM | POA: Diagnosis not present

## 2017-12-18 DIAGNOSIS — T502X5A Adverse effect of carbonic-anhydrase inhibitors, benzothiadiazides and other diuretics, initial encounter: Secondary | ICD-10-CM | POA: Diagnosis present

## 2017-12-18 DIAGNOSIS — E669 Obesity, unspecified: Secondary | ICD-10-CM | POA: Diagnosis present

## 2017-12-18 DIAGNOSIS — Z951 Presence of aortocoronary bypass graft: Secondary | ICD-10-CM

## 2017-12-18 DIAGNOSIS — R000 Tachycardia, unspecified: Secondary | ICD-10-CM | POA: Diagnosis not present

## 2017-12-18 DIAGNOSIS — E11649 Type 2 diabetes mellitus with hypoglycemia without coma: Secondary | ICD-10-CM | POA: Diagnosis not present

## 2017-12-18 DIAGNOSIS — Z7982 Long term (current) use of aspirin: Secondary | ICD-10-CM

## 2017-12-18 DIAGNOSIS — Z4682 Encounter for fitting and adjustment of non-vascular catheter: Secondary | ICD-10-CM

## 2017-12-18 DIAGNOSIS — I255 Ischemic cardiomyopathy: Secondary | ICD-10-CM | POA: Diagnosis not present

## 2017-12-18 DIAGNOSIS — I25110 Atherosclerotic heart disease of native coronary artery with unstable angina pectoris: Secondary | ICD-10-CM | POA: Diagnosis not present

## 2017-12-18 DIAGNOSIS — I5031 Acute diastolic (congestive) heart failure: Secondary | ICD-10-CM | POA: Diagnosis present

## 2017-12-18 DIAGNOSIS — Z6834 Body mass index (BMI) 34.0-34.9, adult: Secondary | ICD-10-CM | POA: Diagnosis not present

## 2017-12-18 DIAGNOSIS — Z79899 Other long term (current) drug therapy: Secondary | ICD-10-CM

## 2017-12-18 DIAGNOSIS — R079 Chest pain, unspecified: Secondary | ICD-10-CM | POA: Diagnosis not present

## 2017-12-18 DIAGNOSIS — I25119 Atherosclerotic heart disease of native coronary artery with unspecified angina pectoris: Secondary | ICD-10-CM | POA: Diagnosis present

## 2017-12-18 DIAGNOSIS — I9751 Accidental puncture and laceration of a circulatory system organ or structure during a circulatory system procedure: Secondary | ICD-10-CM | POA: Diagnosis not present

## 2017-12-18 DIAGNOSIS — I10 Essential (primary) hypertension: Secondary | ICD-10-CM | POA: Diagnosis not present

## 2017-12-18 DIAGNOSIS — I200 Unstable angina: Secondary | ICD-10-CM | POA: Diagnosis not present

## 2017-12-18 DIAGNOSIS — Z833 Family history of diabetes mellitus: Secondary | ICD-10-CM | POA: Diagnosis not present

## 2017-12-18 DIAGNOSIS — R0789 Other chest pain: Secondary | ICD-10-CM | POA: Diagnosis present

## 2017-12-18 DIAGNOSIS — Z01810 Encounter for preprocedural cardiovascular examination: Secondary | ICD-10-CM | POA: Diagnosis not present

## 2017-12-18 DIAGNOSIS — I110 Hypertensive heart disease with heart failure: Secondary | ICD-10-CM | POA: Diagnosis present

## 2017-12-18 DIAGNOSIS — E876 Hypokalemia: Secondary | ICD-10-CM | POA: Diagnosis present

## 2017-12-18 DIAGNOSIS — Y838 Other surgical procedures as the cause of abnormal reaction of the patient, or of later complication, without mention of misadventure at the time of the procedure: Secondary | ICD-10-CM | POA: Diagnosis not present

## 2017-12-18 HISTORY — PX: LEFT HEART CATH AND CORONARY ANGIOGRAPHY: CATH118249

## 2017-12-18 LAB — CBC
HCT: 45.9 % (ref 39.0–52.0)
HEMATOCRIT: 48.3 % (ref 39.0–52.0)
HEMOGLOBIN: 17.2 g/dL — AB (ref 13.0–17.0)
Hemoglobin: 16.1 g/dL (ref 13.0–17.0)
MCH: 29.7 pg (ref 26.0–34.0)
MCH: 29.2 pg (ref 26.0–34.0)
MCHC: 35.6 g/dL (ref 30.0–36.0)
MCHC: 35.1 g/dL (ref 30.0–36.0)
MCV: 83.3 fL (ref 80.0–100.0)
MCV: 83.3 fL (ref 80.0–100.0)
NRBC: 0 % (ref 0.0–0.2)
Platelets: 311 10*3/uL (ref 150–400)
Platelets: 337 10*3/uL (ref 150–400)
RBC: 5.8 MIL/uL (ref 4.22–5.81)
RBC: 5.51 MIL/uL (ref 4.22–5.81)
RDW: 11.7 % (ref 11.5–15.5)
RDW: 11.7 % (ref 11.5–15.5)
WBC: 19.1 10*3/uL — AB (ref 4.0–10.5)
WBC: 21.4 10*3/uL — AB (ref 4.0–10.5)
nRBC: 0 % (ref 0.0–0.2)

## 2017-12-18 LAB — LIPID PANEL
CHOL/HDL RATIO: 6.1 ratio
Cholesterol: 224 mg/dL — ABNORMAL HIGH (ref 0–200)
HDL: 37 mg/dL — AB (ref >40)
LDL Cholesterol: 147 mg/dL — ABNORMAL HIGH (ref 0–99)
TRIGLYCERIDES: 200 mg/dL — AB (ref <150)
VLDL: 40 mg/dL (ref 0–40)

## 2017-12-18 LAB — MRSA PCR SCREENING: MRSA BY PCR: NEGATIVE

## 2017-12-18 LAB — BASIC METABOLIC PANEL
ANION GAP: 10 (ref 5–15)
Anion gap: 10 (ref 5–15)
BUN: 16 mg/dL (ref 6–20)
BUN: 14 mg/dL (ref 6–20)
CHLORIDE: 98 mmol/L (ref 98–111)
CHLORIDE: 99 mmol/L (ref 98–111)
CO2: 25 mmol/L (ref 22–32)
CO2: 24 mmol/L (ref 22–32)
Calcium: 8.7 mg/dL — ABNORMAL LOW (ref 8.9–10.3)
Calcium: 8.8 mg/dL — ABNORMAL LOW (ref 8.9–10.3)
Creatinine, Ser: 0.87 mg/dL (ref 0.61–1.24)
Creatinine, Ser: 0.84 mg/dL (ref 0.61–1.24)
GFR calc Af Amer: >60 mL/min (ref >60)
Glucose, Bld: 147 mg/dL — ABNORMAL HIGH (ref 70–99)
Glucose, Bld: 120 mg/dL — ABNORMAL HIGH (ref 70–99)
POTASSIUM: 2.8 mmol/L — AB (ref 3.5–5.1)
POTASSIUM: 3.6 mmol/L (ref 3.5–5.1)
SODIUM: 133 mmol/L — AB (ref 135–145)
Sodium: 133 mmol/L — ABNORMAL LOW (ref 135–145)

## 2017-12-18 LAB — CREATININE, SERUM
CREATININE: 0.94 mg/dL (ref 0.61–1.24)
GFR calc Af Amer: >60 mL/min (ref >60)
GFR calc non Af Amer: >60 mL/min (ref >60)

## 2017-12-18 LAB — I-STAT TROPONIN, ED: TROPONIN I, POC: 0.45 ng/mL — AB (ref 0.00–0.08)

## 2017-12-18 LAB — HIV ANTIBODY (ROUTINE TESTING W REFLEX): HIV SCREEN 4TH GENERATION: NONREACTIVE

## 2017-12-18 LAB — TROPONIN I
TROPONIN I: 2.4 ng/mL — AB (ref <0.03)
Troponin I: 5 ng/mL (ref <0.03)
Troponin I: 5.58 ng/mL (ref <0.03)

## 2017-12-18 LAB — MAGNESIUM: MAGNESIUM: 1.7 mg/dL (ref 1.7–2.4)

## 2017-12-18 LAB — ECHOCARDIOGRAM COMPLETE
HEIGHTINCHES: 65 in
Weight: 3329.83 oz

## 2017-12-18 LAB — D-DIMER, QUANTITATIVE: D-Dimer, Quant: 0.27 ug/mL-FEU (ref 0.00–0.50)

## 2017-12-18 LAB — HEMOGLOBIN A1C
Hgb A1c MFr Bld: 5.3 % (ref 4.8–5.6)
Mean Plasma Glucose: 105.41 mg/dL

## 2017-12-18 LAB — HEPARIN LEVEL (UNFRACTIONATED): Heparin Unfractionated: 0.18 IU/mL — ABNORMAL LOW (ref 0.30–0.70)

## 2017-12-18 LAB — CBG MONITORING, ED: Glucose-Capillary: 137 mg/dL — ABNORMAL HIGH (ref 70–99)

## 2017-12-18 SURGERY — LEFT HEART CATH AND CORONARY ANGIOGRAPHY
Anesthesia: LOCAL

## 2017-12-18 MED ORDER — SODIUM CHLORIDE 0.9% FLUSH
3.0000 mL | Freq: Two times a day (BID) | INTRAVENOUS | Status: DC
  Administered 2017-12-18 – 2017-12-22 (×7): 3 mL via INTRAVENOUS

## 2017-12-18 MED ORDER — ONDANSETRON HCL 4 MG/2ML IJ SOLN
4.0000 mg | Freq: Four times a day (QID) | INTRAMUSCULAR | Status: DC | PRN
  Administered 2017-12-23: 4 mg via INTRAVENOUS

## 2017-12-18 MED ORDER — SODIUM CHLORIDE 0.9 % IV SOLN: 250.0000 mL | INTRAVENOUS | Status: DC | PRN

## 2017-12-18 MED ORDER — FENTANYL CITRATE (PF) 100 MCG/2ML IJ SOLN
INTRAMUSCULAR | Status: DC | PRN
  Administered 2017-12-18: 50 ug via INTRAVENOUS

## 2017-12-18 MED ORDER — SODIUM CHLORIDE 0.9% FLUSH: 3.0000 mL | INTRAVENOUS | Status: DC | PRN

## 2017-12-18 MED ORDER — HEPARIN (PORCINE) IN NACL 100-0.45 UNIT/ML-% IJ SOLN
1850.0000 [IU]/h | INTRAMUSCULAR | Status: DC
  Administered 2017-12-18 – 2017-12-19 (×2): 1350 [IU]/h via INTRAVENOUS
  Administered 2017-12-20: 1650 [IU]/h via INTRAVENOUS
  Administered 2017-12-21 – 2017-12-22 (×4): 1850 [IU]/h via INTRAVENOUS
  Filled 2017-12-18 (×8): qty 250

## 2017-12-18 MED ORDER — HEPARIN (PORCINE) IN NACL 1000-0.9 UT/500ML-% IV SOLN
INTRAVENOUS | Status: AC
  Filled 2017-12-18: qty 1000

## 2017-12-18 MED ORDER — NITROGLYCERIN 0.4 MG SL SUBL
0.4000 mg | SUBLINGUAL_TABLET | SUBLINGUAL | Status: DC | PRN
  Administered 2017-12-18 (×2): 0.4 mg via SUBLINGUAL
  Filled 2017-12-18: qty 1

## 2017-12-18 MED ORDER — PERFLUTREN LIPID MICROSPHERE
1.0000 mL | INTRAVENOUS | Status: AC | PRN
  Administered 2017-12-18: 2 mL via INTRAVENOUS
  Filled 2017-12-18: qty 10

## 2017-12-18 MED ORDER — ACETAMINOPHEN 325 MG PO TABS: 650.0000 mg | ORAL_TABLET | ORAL | Status: DC | PRN

## 2017-12-18 MED ORDER — SODIUM CHLORIDE 0.9 % WEIGHT BASED INFUSION
1.0000 mL/kg/h | INTRAVENOUS | Status: DC
  Administered 2017-12-18: 1 mL/kg/h via INTRAVENOUS

## 2017-12-18 MED ORDER — INFLUENZA VAC SPLIT QUAD 0.5 ML IM SUSY: 0.5000 mL | PREFILLED_SYRINGE | INTRAMUSCULAR | Status: DC

## 2017-12-18 MED ORDER — LOSARTAN POTASSIUM 25 MG PO TABS
25.0000 mg | ORAL_TABLET | Freq: Every day | ORAL | Status: DC
  Administered 2017-12-19 – 2017-12-22 (×4): 25 mg via ORAL
  Filled 2017-12-18 (×5): qty 1

## 2017-12-18 MED ORDER — ASPIRIN EC 81 MG PO TBEC
81.0000 mg | DELAYED_RELEASE_TABLET | Freq: Every day | ORAL | Status: DC
  Administered 2017-12-18 – 2017-12-22 (×5): 81 mg via ORAL
  Filled 2017-12-18 (×5): qty 1

## 2017-12-18 MED ORDER — MIDAZOLAM HCL 2 MG/2ML IJ SOLN
INTRAMUSCULAR | Status: DC | PRN
  Administered 2017-12-18: 1 mg via INTRAVENOUS

## 2017-12-18 MED ORDER — MIDAZOLAM HCL 2 MG/2ML IJ SOLN
INTRAMUSCULAR | Status: AC
  Filled 2017-12-18: qty 2

## 2017-12-18 MED ORDER — NITROGLYCERIN IN D5W 200-5 MCG/ML-% IV SOLN
0.0000 ug/min | Freq: Once | INTRAVENOUS | Status: AC
  Administered 2017-12-18: 5 ug/min via INTRAVENOUS
  Filled 2017-12-18: qty 250

## 2017-12-18 MED ORDER — MORPHINE SULFATE (PF) 2 MG/ML IV SOLN
2.0000 mg | INTRAVENOUS | Status: DC | PRN
  Administered 2017-12-18 (×3): 2 mg via INTRAVENOUS
  Filled 2017-12-18 (×3): qty 1

## 2017-12-18 MED ORDER — HEPARIN (PORCINE) IN NACL 1000-0.9 UT/500ML-% IV SOLN
INTRAVENOUS | Status: DC | PRN
  Administered 2017-12-18 (×2): 500 mL

## 2017-12-18 MED ORDER — AMLODIPINE BESYLATE 10 MG PO TABS
10.0000 mg | ORAL_TABLET | Freq: Every day | ORAL | Status: DC
  Administered 2017-12-18: 10 mg via ORAL
  Filled 2017-12-18: qty 1

## 2017-12-18 MED ORDER — LIDOCAINE HCL (PF) 1 % IJ SOLN
INTRAMUSCULAR | Status: AC
  Filled 2017-12-18: qty 30

## 2017-12-18 MED ORDER — ENOXAPARIN SODIUM 40 MG/0.4ML ~~LOC~~ SOLN: 40.0000 mg | SUBCUTANEOUS | Status: DC

## 2017-12-18 MED ORDER — POTASSIUM CHLORIDE CRYS ER 20 MEQ PO TBCR
40.0000 meq | EXTENDED_RELEASE_TABLET | Freq: Once | ORAL | Status: AC
  Administered 2017-12-18: 40 meq via ORAL
  Filled 2017-12-18: qty 2

## 2017-12-18 MED ORDER — HEPARIN (PORCINE) IN NACL 100-0.45 UNIT/ML-% IJ SOLN
1200.0000 [IU]/h | INTRAMUSCULAR | Status: DC
  Administered 2017-12-18: 1200 [IU]/h via INTRAVENOUS
  Filled 2017-12-18: qty 250

## 2017-12-18 MED ORDER — VERAPAMIL HCL 2.5 MG/ML IV SOLN
INTRAVENOUS | Status: AC
  Filled 2017-12-18: qty 2

## 2017-12-18 MED ORDER — SODIUM CHLORIDE 0.9 % WEIGHT BASED INFUSION: 3.0000 mL/kg/h | INTRAVENOUS | Status: DC

## 2017-12-18 MED ORDER — VERAPAMIL HCL 2.5 MG/ML IV SOLN
INTRAVENOUS | Status: DC | PRN
  Administered 2017-12-18: 10 mL via INTRA_ARTERIAL

## 2017-12-18 MED ORDER — LIDOCAINE HCL (PF) 1 % IJ SOLN
INTRAMUSCULAR | Status: DC | PRN
  Administered 2017-12-18: 2 mL

## 2017-12-18 MED ORDER — SODIUM CHLORIDE 0.9 % IV SOLN
250.0000 mL | INTRAVENOUS | Status: DC | PRN
  Administered 2017-12-23: 15:00:00 via INTRAVENOUS

## 2017-12-18 MED ORDER — HEPARIN SODIUM (PORCINE) 1000 UNIT/ML IJ SOLN
INTRAMUSCULAR | Status: DC | PRN
  Administered 2017-12-18: 5000 [IU] via INTRAVENOUS

## 2017-12-18 MED ORDER — HEPARIN BOLUS VIA INFUSION
4000.0000 [IU] | Freq: Once | INTRAVENOUS | Status: AC
  Administered 2017-12-18: 4000 [IU] via INTRAVENOUS
  Filled 2017-12-18: qty 4000

## 2017-12-18 MED ORDER — METOPROLOL TARTRATE 12.5 MG HALF TABLET
12.5000 mg | ORAL_TABLET | Freq: Two times a day (BID) | ORAL | Status: DC
  Administered 2017-12-18: 12.5 mg via ORAL
  Filled 2017-12-18: qty 1

## 2017-12-18 MED ORDER — CARVEDILOL 3.125 MG PO TABS
3.1250 mg | ORAL_TABLET | Freq: Two times a day (BID) | ORAL | Status: DC
  Filled 2017-12-18: qty 1

## 2017-12-18 MED ORDER — SODIUM CHLORIDE 0.9% FLUSH: 3.0000 mL | Freq: Two times a day (BID) | INTRAVENOUS | Status: DC

## 2017-12-18 MED ORDER — FENTANYL CITRATE (PF) 100 MCG/2ML IJ SOLN
INTRAMUSCULAR | Status: AC
  Filled 2017-12-18: qty 2

## 2017-12-18 MED ORDER — ASPIRIN EC 325 MG PO TBEC: 325.0000 mg | DELAYED_RELEASE_TABLET | Freq: Every day | ORAL | Status: DC

## 2017-12-18 MED ORDER — POTASSIUM CHLORIDE CRYS ER 20 MEQ PO TBCR
60.0000 meq | EXTENDED_RELEASE_TABLET | Freq: Once | ORAL | Status: AC
  Administered 2017-12-18: 60 meq via ORAL
  Filled 2017-12-18: qty 3

## 2017-12-18 MED ORDER — IOHEXOL 350 MG/ML SOLN
INTRAVENOUS | Status: DC | PRN
  Administered 2017-12-18: 75 mL via INTRACARDIAC

## 2017-12-18 MED ORDER — ATORVASTATIN CALCIUM 80 MG PO TABS
80.0000 mg | ORAL_TABLET | Freq: Every day | ORAL | Status: DC
  Administered 2017-12-19 – 2017-12-22 (×4): 80 mg via ORAL
  Filled 2017-12-18 (×5): qty 1

## 2017-12-18 MED ORDER — MAGNESIUM SULFATE 2 GM/50ML IV SOLN
2.0000 g | Freq: Once | INTRAVENOUS | Status: AC
  Administered 2017-12-18: 2 g via INTRAVENOUS
  Filled 2017-12-18: qty 50

## 2017-12-18 SURGICAL SUPPLY — 11 items
CATH INFINITI 5 FR JL3.5 (CATHETERS) ×2 IMPLANT
CATH INFINITI 5FR JK (CATHETERS) ×2 IMPLANT
CATH INFINITI JR4 5F (CATHETERS) ×2 IMPLANT
DEVICE RAD COMP TR BAND LRG (VASCULAR PRODUCTS) ×2 IMPLANT
GLIDESHEATH SLEND SS 6F .021 (SHEATH) ×2 IMPLANT
GUIDEWIRE INQWIRE 1.5J.035X260 (WIRE) ×1 IMPLANT
INQWIRE 1.5J .035X260CM (WIRE) ×2
KIT HEART LEFT (KITS) ×2 IMPLANT
PACK CARDIAC CATHETERIZATION (CUSTOM PROCEDURE TRAY) ×2 IMPLANT
TRANSDUCER W/STOPCOCK (MISCELLANEOUS) ×2 IMPLANT
TUBING CIL FLEX 10 FLL-RA (TUBING) ×2 IMPLANT

## 2017-12-18 NOTE — Progress Notes (Addendum)
ANTICOAGULATION CONSULT NOTE  Pharmacy Consult for Heparin  Indication: chest pain/ACS  No Known Allergies  Patient Measurements: Height: 5\' 5"  (165.1 cm) Weight: 208 lb 1.8 oz (94.4 kg) IBW/kg (Calculated) : 61.5  Vital Signs: Temp: 98.4 F (36.9 C) (10/09 0800) Temp Source: Oral (10/09 0800) BP: 130/94 (10/09 0800) Pulse Rate: 101 (10/09 0800)  Labs: Recent Labs    12/18/17 0234 12/18/17 0421 12/18/17 0839 12/18/17 1040  HGB 17.2* 16.1  --   --   HCT 48.3 45.9  --   --   PLT 311 337  --   --   HEPARINUNFRC  --   --   --  0.18*  CREATININE 0.87 0.94 0.84  --   TROPONINI  --  2.40* 5.00*  --     Estimated Creatinine Clearance: 119.8 mL/min (by C-G formula based on SCr of 0.84 mg/dL).  Medical History: Past Medical History:  Diagnosis Date  . Brain tumor ([place])   . Hypertension   . Seizures [facility])    Assessment: 42 y/o M with chest pain and shortness of breath - trop increased to 5. No anticoag PTA.  Initial heparin level came back subtherapeutic at 0.18, on 1200 units/hr. Hgb 16.1, plt 337. No s/sx of bleeding.   Was going to increase to 1350 units/hr if didn't leave for cardiac cath yet.   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 Plan to restart 8 hours after sheath removal (documneted on 10/9@2130 ) - will increase rate to 1350 units/hr given previously subtherapeutic rate. Monitor heparin level 8 hours after restart and daily.   Doylene Canard, PharmD Clinical Pharmacist

## 2017-12-18 NOTE — Progress Notes (Signed)
Md notified of pt's critical trop 2.4. Pt on heparin gtt.  No diet ordered.  Pain free at this time.  Will continue to monitor Sa[facility] Revel T

## 2017-12-18 NOTE — Progress Notes (Signed)
*  PRELIMINARY RESULTS* Echocardiogram 2D Echocardiogram with definity has been performed.  Leavy Cella 12/18/2017, 12:20 PM

## 2017-12-18 NOTE — ED Notes (Signed)
Patient transported to X-ray 
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
## 2017-12-18 NOTE — ED Notes (Signed)
Repeat ECG obtained with new complaint of L chest pain. Given to [place], MD.

## 2017-12-18 NOTE — ED Notes (Signed)
ED Provider at bedside. 

## 2017-12-18 NOTE — Consult Note (Signed)
Cardiology Consultation:   Patient ID: BART ASHFORD; [id number]; Feb 21, 1976   Admit date: 12/18/2017 Date of Consult: 12/18/2017  Primary Care Provider: [name], PA Primary Cardiologist: New Primary Electrophysiologist:  None   Patient Profile:   Paul Duncan is a 42 y.o. male with a PMH of HTN and a brain tumor s/p resection who is being seen today for the evaluation of chest pain at the request of Dr. Hal Hope.  History of Present Illness:   Mr. Keenum was in his usual state of health until yesterday evening when he developed substernal chest pain. Pain was non-radiating. No associated SOB, N/V, diaphoresis, dizziness, lightheadedness, or syncope. Pain persisted for a couple hours prompting him to activate EMS. He was given SL nitro on arrival to the ED with improvement in symptoms.   No prior cardiac history. He reports having borderline elevated cholesterol and pre-diabetes on labs with his PCP but has not been started on medications for these. He states he has lost ~8 lbs since February of this year. He had a cardiac work-up, including an echocardiogram and a stress test ~3 years ago which was negative - recommended after his brother passed away from complications of mitral valve issues. He reports a family history of CAD in his father who suffered an MI with subsequent CABG at the age of 68. He denies tobacco, etoh, or illicit drug use.   At the time of this evaluation he reports some dull chest discomfort, rated as a 1/10. He reports for the past 2-3 days he has had some DOE when walking from his car to his office but no associated chest pain. He has also felt fatigued with some rhinorrhea and dry cough for the past 2-3 days as well. He denies fever, chills, LE edema, orthopnea, PND, abdominal pain, dysuria, hematuria, melena, or hematochezia.   ED course: VSS. Labs notable for hypokalemia 2.9, Na 133, Cr 0.87, WBC 21.4, Hgb 16.1, PLT 337, Ddimer negative, Trop  0.45>2.40. CXR without acute findings. EKG with sinus rhythm without STE/D, TWI aVR and III, QTC 489. Patient was started on a heparin gtt, K repleted, admitted to medicine. Cardiology asked to evaluate for NSTEMI.   Past Medical History:  Diagnosis Date  . Brain tumor ([place])   . Hypertension   . Seizures ([place])     Past Surgical History:  Procedure Laterality Date  . BRAIN SURGERY    . BRAIN SURGERY  may2007     Home Medications:  Prior to Admission medications   Medication Sig Start Date End Date Taking? Authorizing Provider  amLODipine (NORVASC) 10 MG tablet Take 10 mg by mouth daily. 12/13/17  Yes [provider]  aspirin 81 MG tablet Take 81 mg by mouth daily.   Yes [provider]  hydrochlorothiazide (HYDRODIURIL) 25 MG tablet Take 25 mg by mouth daily.   Yes [provider]  cephALEXin (KEFLEX) 500 MG capsule Take 1 capsule (500 mg total) by mouth 4 (four) times daily. Patient not taking: Reported on 12/18/2017 09/17/15   Leandrew Koyanagi, MD  mupirocin ointment Drue Stager) 2 % Apply to burn tid for 5-7d Patient not taking: Reported on 12/18/2017 09/17/15   Leandrew Koyanagi, MD    Inpatient Medications: Scheduled Meds: . amLODipine  10 mg Oral Daily  . aspirin EC  325 mg Oral Daily  . atorvastatin  80 mg Oral q1800  . [START ON 12/19/2017] Influenza vac split quadrivalent PF  0.5 mL Intramuscular Tomorrow-1000  .  metoprolol tartrate  12.5 mg Oral BID   Continuous Infusions: . heparin 1,200 Units/hr (12/18/17 0500)   PRN Meds: acetaminophen, morphine injection, nitroGLYCERIN, ondansetron (ZOFRAN) IV  Allergies:   No Known Allergies  Social History:   Social History   Socioeconomic History  . Marital status: Single    Spouse name: Not on file  . Number of children: Not on file  . Years of education: Not on file  . Highest education level: Not on file  Occupational History  . Not on file  Social Needs  . Financial resource strain:  Not on file  . Food insecurity:    Worry: Not on file    Inability: Not on file  . Transportation needs:    Medical: Not on file    Non-medical: Not on file  Tobacco Use  . Smoking status: Never Smoker  . Smokeless tobacco: Never Used  Substance and Sexual Activity  . Alcohol use: No  . Drug use: No  . Sexual activity: Not on file  Lifestyle  . Physical activity:    Days per week: Not on file    Minutes per session: Not on file  . Stress: Not on file  Relationships  . Social connections:    Talks on phone: Not on file    Gets together: Not on file    Attends religious service: Not on file    Active member of club or organization: Not on file    Attends meetings of clubs or organizations: Not on file    Relationship status: Not on file  . Intimate partner violence:    Fear of current or ex partner: Not on file    Emotionally abused: Not on file    Physically abused: Not on file    Forced sexual activity: Not on file  Other Topics Concern  . Not on file  Social History Narrative   ** Merged History Encounter **        Family History:    Family History  Problem Relation Age of Onset  . Diabetes Mother   . Heart disease Father      ROS:  Please see the history of present illness.    All other ROS reviewed and negative.     Physical Exam/Data:   Vitals:   12/18/17 0545 12/18/17 0600 12/18/17 0650 12/18/17 0700  BP: 118/75 119/85  132/69  Pulse: 89 95 87 96  Resp: 12 17 14 16   Temp:      TempSrc:      SpO2: 96% 96% 99% 100%  Weight:      Height:        Intake/Output Summary (Last 24 hours) at 12/18/2017 0714 Last data filed at 12/18/2017 0600 Gross per 24 hour  Intake 66.93 ml  Output -  Net 66.93 ml   Filed Weights   12/18/17 0221 12/18/17 0519  Weight: 93 kg 94.4 kg   Body mass index is 34.63 kg/m.  General:  Well nourished, well developed, laying in bed in no acute distress HEENT: sclera anicteric  Neck: no JVD Vascular: No carotid bruits;  distal pulses 2+ bilaterally Cardiac:  normal S1, S2; RRR; no murmurs, rubs, or gallops Lungs:  clear to auscultation bilaterally, no wheezing, rhonchi or rales  Abd: NABS, soft, obese, nontender, no hepatomegaly Ext: no edema Musculoskeletal:  No deformities, BUE and BLE strength normal and equal Skin: warm and dry  Neuro:  CNs 2-12 intact, no focal abnormalities noted Psych:  Normal affect  EKG:  The EKG was personally reviewed and demonstrates:  Sinus rhythm without STE/D, TWI in aVR and III, QTc 489 Telemetry:  Telemetry was personally reviewed and demonstrates:  Sinus rhythm with intermittent sinus tachycardia and occasional PACs  Relevant CV Studies: None  Laboratory Data:  Chemistry Recent Labs  Lab 12/18/17 0234 12/18/17 0421  NA 133*  --   K 2.8*  --   CL 98  --   CO2 25  --   GLUCOSE 147*  --   BUN 16  --   CREATININE 0.87 0.94  CALCIUM 8.7*  --   GFRNONAA >60 >60  GFRAA >60 >60  ANIONGAP 10  --     No results for input(s): PROT, ALBUMIN, AST, ALT, ALKPHOS, BILITOT in the last 168 hours. Hematology Recent Labs  Lab 12/18/17 0234 12/18/17 0421  WBC 19.1* 21.4*  RBC 5.80 5.51  HGB 17.2* 16.1  HCT 48.3 45.9  MCV 83.3 83.3  MCH 29.7 29.2  MCHC 35.6 35.1  RDW 11.7 11.7  PLT 311 337   Cardiac Enzymes Recent Labs  Lab 12/18/17 0421  TROPONINI 2.40*    Recent Labs  Lab 12/18/17 0240  TROPIPOC 0.45*    BNPNo results for input(s): BNP, PROBNP in the last 168 hours.  DDimer  Recent Labs  Lab 12/18/17 0421  DDIMER 0.27    Radiology/Studies:  Dg Chest 2 View  Result Date: 12/18/2017 CLINICAL DATA:  Mid chest pain and shortness of breath. EXAM: CHEST - 2 VIEW COMPARISON:  04/25/2012 FINDINGS: Mildly low lung volumes. The lungs appear clear. Cardiac and mediastinal margins appear normal. No pleural effusion. IMPRESSION: 1.  No active cardiopulmonary disease is radiographically apparent. Electronically Signed   By: Van Clines M.D.   On:  12/18/2017 02:55    Assessment and Plan:   1. NSTEMI: patient presented with chest pain, improved with nitro. Trop elevated 0.45>2.4. Ddimer negative. EKG with sinus rhythm without STE/D, TWI in aVR and lead III. Risk factors for ACS include HTN and family history of early onset CAD (father with CABG at age 48). He was started on a heparin gtt and prn nitro - Will plan for LHC today - keep NPO - Continue heparin gtt and prn nitro - Continue to trend troponin x3 or to peak - Will check a lipid panel and HgbA1C for risk stratification - Will check an echocardiogram to assess LV function.  - Continue aspirin, statin, and BBlocker  2. HTN: BP overall stable. Home HCTZ held given hypokalemia and anticipation of possible cath. Started on low dose BBlocker - Continue amlodipine and metoprolol for now  3. Hypokalemia: K 2.8 this morning. Repleted with 60 mEq - Will recheck BMET now.   4. Borderline QTc: QTc 489 on most recent EKG.  - Will check Mg and BMET now - Avoid QT prolonging medications.   5. Leukocytosis: patient reports WBC has been elevated on recent labs with PCP. WBC 21 this morning. He notes having some rhinorrhea and dry cough. CXR clear. No urinary complaints.  - Will defer management/further work-up to primary team.   For questions or updates, please contact [place] Please consult [email or web address] for contact info under Cardiology/STEMI.   Signed, Abigail Butts, PA-C  12/18/2017 7:14 AM [phone]

## 2017-12-18 NOTE — H&P (View-Only) (Signed)
Cardiology Consultation:   Patient ID: MONTA POLICE; [id number]; [birth year]   Admit date: 12/18/2017 Date of Consult: 12/18/2017  Primary Care Provider: [name], PA Primary Cardiologist: New Primary Electrophysiologist:  None   Patient Profile:   Paul Duncan is a 42 y.o. male with a PMH of HTN and a brain tumor s/p resection who is being seen today for the evaluation of chest pain at the request of Dr. Hal Hope.  History of Present Illness:   Paul Duncan was in his usual state of health until yesterday evening when he developed substernal chest pain. Pain was non-radiating. No associated SOB, N/V, diaphoresis, dizziness, lightheadedness, or syncope. Pain persisted for a couple hours prompting him to activate EMS. He was given SL nitro on arrival to the ED with improvement in symptoms.   No prior cardiac history. He reports having borderline elevated cholesterol and pre-diabetes on labs with his PCP but has not been started on medications for these. He states he has lost ~8 lbs since February of this year. He had a cardiac work-up, including an echocardiogram and a stress test ~3 years ago which was negative - recommended after his brother passed away from complications of mitral valve issues. He reports a family history of CAD in his father who suffered an MI with subsequent CABG at the age of 31. He denies tobacco, etoh, or illicit drug use.   At the time of this evaluation he reports some dull chest discomfort, rated as a 1/10. He reports for the past 2-3 days he has had some DOE when walking from his car to his office but no associated chest pain. He has also felt fatigued with some rhinorrhea and dry cough for the past 2-3 days as well. He denies fever, chills, LE edema, orthopnea, PND, abdominal pain, dysuria, hematuria, melena, or hematochezia.   ED course: VSS. Labs notable for hypokalemia 2.9, Na 133, Cr 0.87, WBC 21.4, Hgb 16.1, PLT 337, Ddimer negative, Trop  0.45>2.40. CXR without acute findings. EKG with sinus rhythm without STE/D, TWI aVR and III, QTC 489. Patient was started on a heparin gtt, K repleted, admitted to medicine. Cardiology asked to evaluate for NSTEMI.   Past Medical History:  Diagnosis Date  . Brain tumor ([place])   . Hypertension   . Seizures ([place])     Past Surgical History:  Procedure Laterality Date  . BRAIN SURGERY    . BRAIN SURGERY  may2007     Home Medications:  Prior to Admission medications   Medication Sig Start Date End Date Taking? Authorizing Provider  amLODipine (NORVASC) 10 MG tablet Take 10 mg by mouth daily. 12/13/17  Yes [provider]  aspirin 81 MG tablet Take 81 mg by mouth daily.   Yes [provider]  hydrochlorothiazide (HYDRODIURIL) 25 MG tablet Take 25 mg by mouth daily.   Yes [provider]  cephALEXin (KEFLEX) 500 MG capsule Take 1 capsule (500 mg total) by mouth 4 (four) times daily. Patient not taking: Reported on 12/18/2017 09/17/15   Leandrew Koyanagi, MD  mupirocin ointment Drue Stager) 2 % Apply to burn tid for 5-7d Patient not taking: Reported on 12/18/2017 09/17/15   Leandrew Koyanagi, MD    Inpatient Medications: Scheduled Meds: . amLODipine  10 mg Oral Daily  . aspirin EC  325 mg Oral Daily  . atorvastatin  80 mg Oral q1800  . [START ON 12/19/2017] Influenza vac split quadrivalent PF  0.5 mL Intramuscular Tomorrow-1000  .  metoprolol tartrate  12.5 mg Oral BID   Continuous Infusions: . heparin 1,200 Units/hr (12/18/17 0500)   PRN Meds: acetaminophen, morphine injection, nitroGLYCERIN, ondansetron (ZOFRAN) IV  Allergies:   No Known Allergies  Social History:   Social History   Socioeconomic History  . Marital status: Single    Spouse name: Not on file  . Number of children: Not on file  . Years of education: Not on file  . Highest education level: Not on file  Occupational History  . Not on file  Social Needs  . Financial resource strain:  Not on file  . Food insecurity:    Worry: Not on file    Inability: Not on file  . Transportation needs:    Medical: Not on file    Non-medical: Not on file  Tobacco Use  . Smoking status: Never Smoker  . Smokeless tobacco: Never Used  Substance and Sexual Activity  . Alcohol use: No  . Drug use: No  . Sexual activity: Not on file  Lifestyle  . Physical activity:    Days per week: Not on file    Minutes per session: Not on file  . Stress: Not on file  Relationships  . Social connections:    Talks on phone: Not on file    Gets together: Not on file    Attends religious service: Not on file    Active member of club or organization: Not on file    Attends meetings of clubs or organizations: Not on file    Relationship status: Not on file  . Intimate partner violence:    Fear of current or ex partner: Not on file    Emotionally abused: Not on file    Physically abused: Not on file    Forced sexual activity: Not on file  Other Topics Concern  . Not on file  Social History Narrative   ** Merged History Encounter **        Family History:    Family History  Problem Relation Age of Onset  . Diabetes Mother   . Heart disease Father      ROS:  Please see the history of present illness.    All other ROS reviewed and negative.     Physical Exam/Data:   Vitals:   12/18/17 0545 12/18/17 0600 12/18/17 0650 12/18/17 0700  BP: 118/75 119/85  132/69  Pulse: 89 95 87 96  Resp: 12 17 14 16   Temp:      TempSrc:      SpO2: 96% 96% 99% 100%  Weight:      Height:        Intake/Output Summary (Last 24 hours) at 12/18/2017 0714 Last data filed at 12/18/2017 0600 Gross per 24 hour  Intake 66.93 ml  Output -  Net 66.93 ml   Filed Weights   12/18/17 0221 12/18/17 0519  Weight: 93 kg 94.4 kg   Body mass index is 34.63 kg/m.  General:  Well nourished, well developed, laying in bed in no acute distress HEENT: sclera anicteric  Neck: no JVD Vascular: No carotid bruits;  distal pulses 2+ bilaterally Cardiac:  normal S1, S2; RRR; no murmurs, rubs, or gallops Lungs:  clear to auscultation bilaterally, no wheezing, rhonchi or rales  Abd: NABS, soft, obese, nontender, no hepatomegaly Ext: no edema Musculoskeletal:  No deformities, BUE and BLE strength normal and equal Skin: warm and dry  Neuro:  CNs 2-12 intact, no focal abnormalities noted Psych:  Normal affect  EKG:  The EKG was personally reviewed and demonstrates:  Sinus rhythm without STE/D, TWI in aVR and III, QTc 489 Telemetry:  Telemetry was personally reviewed and demonstrates:  Sinus rhythm with intermittent sinus tachycardia and occasional PACs  Relevant CV Studies: None  Laboratory Data:  Chemistry Recent Labs  Lab 12/18/17 0234 12/18/17 0421  NA 133*  --   K 2.8*  --   CL 98  --   CO2 25  --   GLUCOSE 147*  --   BUN 16  --   CREATININE 0.87 0.94  CALCIUM 8.7*  --   GFRNONAA >60 >60  GFRAA >60 >60  ANIONGAP 10  --     No results for input(s): PROT, ALBUMIN, AST, ALT, ALKPHOS, BILITOT in the last 168 hours. Hematology Recent Labs  Lab 12/18/17 0234 12/18/17 0421  WBC 19.1* 21.4*  RBC 5.80 5.51  HGB 17.2* 16.1  HCT 48.3 45.9  MCV 83.3 83.3  MCH 29.7 29.2  MCHC 35.6 35.1  RDW 11.7 11.7  PLT 311 337   Cardiac Enzymes Recent Labs  Lab 12/18/17 0421  TROPONINI 2.40*    Recent Labs  Lab 12/18/17 0240  TROPIPOC 0.45*    BNPNo results for input(s): BNP, PROBNP in the last 168 hours.  DDimer  Recent Labs  Lab 12/18/17 0421  DDIMER 0.27    Radiology/Studies:  Dg Chest 2 View  Result Date: 12/18/2017 CLINICAL DATA:  Mid chest pain and shortness of breath. EXAM: CHEST - 2 VIEW COMPARISON:  04/25/2012 FINDINGS: Mildly low lung volumes. The lungs appear clear. Cardiac and mediastinal margins appear normal. No pleural effusion. IMPRESSION: 1.  No active cardiopulmonary disease is radiographically apparent. Electronically Signed   By: Van Clines M.D.   On:  12/18/2017 02:55    Assessment and Plan:   1. NSTEMI: patient presented with chest pain, improved with nitro. Trop elevated 0.45>2.4. Ddimer negative. EKG with sinus rhythm without STE/D, TWI in aVR and lead III. Risk factors for ACS include HTN and family history of early onset CAD (father with CABG at age 77). He was started on a heparin gtt and prn nitro - Will plan for LHC today - keep NPO - Continue heparin gtt and prn nitro - Continue to trend troponin x3 or to peak - Will check a lipid panel and HgbA1C for risk stratification - Will check an echocardiogram to assess LV function.  - Continue aspirin, statin, and BBlocker  2. HTN: BP overall stable. Home HCTZ held given hypokalemia and anticipation of possible cath. Started on low dose BBlocker - Continue amlodipine and metoprolol for now  3. Hypokalemia: K 2.8 this morning. Repleted with 60 mEq - Will recheck BMET now.   4. Borderline QTc: QTc 489 on most recent EKG.  - Will check Mg and BMET now - Avoid QT prolonging medications.   5. Leukocytosis: patient reports WBC has been elevated on recent labs with PCP. WBC 21 this morning. He notes having some rhinorrhea and dry cough. CXR clear. No urinary complaints.  - Will defer management/further work-up to primary team.   For questions or updates, please contact [place] Please consult [email or web address] for contact info under Cardiology/STEMI.   Signed, Abigail Butts, PA-C  12/18/2017 7:14 AM [phone]

## 2017-12-18 NOTE — ED Provider Notes (Signed)
[facility] EMERGENCY DEPARTMENT Provider Note   CSN: [id number] Arrival date & time: 12/18/17  9323     History   Chief Complaint Chief Complaint  Patient presents with  . Chest Pain  . Shortness of Breath    HPI Paul Duncan is a 42 y.o. male.  HPI  Paul Duncan is a 42yo male with a history of hypertension who presents to the emergency department via EMS for evaluation of central chest pain and shortness of breath.  Patient states that he had a bowl of soup this evening around 8:30 PM.  Shortly after he developed gradually worsening nonradiating central chest pressure which was about a 7/10 in severity.  He called EMS given it was not resolving.  He took 325mg  chewable aspirin.  He reports that the pain has slowly improved and is now 1/10 in severity.  Denies associated diaphoresis, lightheadedness, nausea, shortness of breath, numbness, weakness, abdominal pain, syncope.  His father had a heart attack at age 34.  He does not smoke and has no history of exertional chest pain.  No prior history of DVT/PE, exogenous hormone use, active cancer, recent surgery or immobilization, hemoptysis, unilateral leg swelling or calf tenderness.  No alcohol use or recreational drug use.  No recent illness with fever, cough, congestion, sore throat.  Past Medical History:  Diagnosis Date  . Brain tumor ([place])   . Hypertension   . Seizures ([place])     There are no active problems to display for this patient.   Past Surgical History:  Procedure Laterality Date  . BRAIN SURGERY    . BRAIN SURGERY  may2007        Home Medications    Prior to Admission medications   Medication Sig Start Date End Date Taking? Authorizing Provider  aspirin 81 MG tablet Take 81 mg by mouth daily.    [provider]  cephALEXin (KEFLEX) 500 MG capsule Take 1 capsule (500 mg total) by mouth 4 (four) times daily. 09/17/15   Leandrew Koyanagi, MD  hydrochlorothiazide  (HYDRODIURIL) 25 MG tablet Take 25 mg by mouth daily.    [provider]  mupirocin ointment (BACTROBAN) 2 % Apply to burn tid for 5-7d 09/17/15   Leandrew Koyanagi, MD    Family History Family History  Problem Relation Age of Onset  . Diabetes Mother   . Heart disease Father     Social History Social History   Tobacco Use  . Smoking status: Never Smoker  . Smokeless tobacco: Never Used  Substance Use Topics  . Alcohol use: No  . Drug use: No     Allergies   Patient has no known allergies.   Review of Systems Review of Systems  Constitutional: Negative for chills, diaphoresis and fever.  HENT: Negative for congestion and sore throat.   Respiratory: Positive for shortness of breath. Negative for cough.   Cardiovascular: Positive for chest pain.  Gastrointestinal: Negative for abdominal pain, nausea and vomiting.  Genitourinary: Negative for difficulty urinating.  Musculoskeletal: Negative for back pain and gait problem.  Skin: Negative for color change.  Neurological: Negative for syncope, weakness, light-headedness and numbness.  Psychiatric/Behavioral: Negative for agitation.     Physical Exam Updated Vital Signs BP 116/73   Pulse 91   Temp 98.1 F (36.7 C) (Oral)   Resp 19   Ht 5\' 5"  (1.651 m)   Wt 93 kg   SpO2 98%   BMI 34.11 kg/m   Physical  Exam  Constitutional: He is oriented to person, place, and time. He appears well-developed and well-nourished. No distress.  Nontoxic-appearing.  No diaphoresis.  HENT:  Head: Normocephalic and atraumatic.  Mouth/Throat: Oropharynx is clear and moist.  Eyes: Pupils are equal, round, and reactive to light. Conjunctivae are normal. Right eye exhibits no discharge. Left eye exhibits no discharge.  Neck: Normal range of motion. No JVD present. No tracheal deviation present.  Cardiovascular: Normal rate, regular rhythm and intact distal pulses.  No murmur heard. Pulmonary/Chest: Effort normal and breath  sounds normal. No stridor. No respiratory distress. He has no wheezes. He has no rales.  Anterior chest wall nontender to palpation.  Abdominal: Soft. There is no tenderness.  Musculoskeletal:  No leg swelling or calf tenderness.  Neurological: He is alert and oriented to person, place, and time. Coordination normal.  Skin: Skin is warm and dry. He is not diaphoretic.  Psychiatric: He has a normal mood and affect. His behavior is normal.  Nursing note and vitals reviewed.    ED Treatments / Results  Labs (all labs ordered are listed, but only abnormal results are displayed) Labs Reviewed  BASIC METABOLIC PANEL - Abnormal; Notable for the following components:      Result Value   Sodium 133 (*)    Potassium 2.8 (*)    Glucose, Bld 147 (*)    Calcium 8.7 (*)    All other components within normal limits  CBC - Abnormal; Notable for the following components:   WBC 19.1 (*)    Hemoglobin 17.2 (*)    All other components within normal limits  I-STAT TROPONIN, ED - Abnormal; Notable for the following components:   Troponin i, poc 0.45 (*)    All other components within normal limits  CBG MONITORING, ED - Abnormal; Notable for the following components:   Glucose-Capillary 137 (*)    All other components within normal limits  HEPARIN LEVEL (UNFRACTIONATED)  HIV ANTIBODY (ROUTINE TESTING W REFLEX)  CBC  CREATININE, SERUM  TROPONIN I  TROPONIN I  TROPONIN I  D-DIMER, QUANTITATIVE (NOT AT [facility])    EKG EKG Interpretation  Date/Time:  Wednesday December 18 2017 02:22:25 EDT Ventricular Rate:  103 PR Interval:  160 QRS Duration: 108 QT Interval:  384 QTC Calculation: 503 R Axis:   -56 Text Interpretation:  Sinus tachycardia Possible Left atrial enlargement Left anterior fascicular block Abnormal ECG No significant change since last tracing Confirmed by Addison Lank [phone]) on 12/18/2017 2:31:38 AM   Radiology Dg Chest 2 View  Result Date: 12/18/2017 CLINICAL DATA:  Mid  chest pain and shortness of breath. EXAM: CHEST - 2 VIEW COMPARISON:  04/25/2012 FINDINGS: Mildly low lung volumes. The lungs appear clear. Cardiac and mediastinal margins appear normal. No pleural effusion. IMPRESSION: 1.  No active cardiopulmonary disease is radiographically apparent. Electronically Signed   By: Van Clines M.D.   On: 12/18/2017 02:55    Procedures Procedures (including critical care time)  Medications Ordered in ED Medications  nitroGLYCERIN (NITROSTAT) SL tablet 0.4 mg (0.4 mg Sublingual Given 12/18/17 0320)  heparin ADULT infusion 100 units/mL (25000 units/281mL sodium chloride 0.45%) (1,200 Units/hr Intravenous New Bag/Given 12/18/17 0342)  potassium chloride SA (K-DUR,KLOR-CON) CR tablet 60 mEq (has no administration in time range)  amLODipine (NORVASC) tablet 10 mg (has no administration in time range)  acetaminophen (TYLENOL) tablet 650 mg (has no administration in time range)  ondansetron (ZOFRAN) injection 4 mg (has no administration in time range)  enoxaparin (LOVENOX)  injection 40 mg (has no administration in time range)  aspirin EC tablet 325 mg (has no administration in time range)  morphine 2 MG/ML injection 2 mg (has no administration in time range)  heparin bolus via infusion 4,000 Units (4,000 Units Intravenous Bolus from Bag 12/18/17 0343)     Initial Impression / Assessment and Plan / ED Course  I have reviewed the triage vital signs and the nursing notes.  Pertinent labs & imaging results that were available during my care of the patient were reviewed by me and considered in my medical decision making (see chart for details).    Troponin 0.45 on arrival. EKG without ST segment changes or new T-wave inversions. Concern for NSTEMI. Patient started on heparin and cardiology consulted.   CXR without widening of the mediastinum, no new murmur, no neurological deficits or radiation to the back and I doubt dissection. No PE risk factors.   Patient  given SL nitroglycerin for pain and he reports improvement. He will be admitted by Dr. Hal Hope who will admit to medicine. Patient informed and he agrees with plan. This was a shared visit with Dr. Leonette Monarch who also saw the patient prior to admission.   Final Clinical Impressions(s) / ED Diagnoses   Final diagnoses:  None    ED Discharge Orders    None       Glyn Ade, PA-C 12/18/17 0435    Fatima Blank, MD 12/18/17 [phone]

## 2017-12-18 NOTE — ED Notes (Addendum)
Hospitalist at bedside. Informed of potassium level.

## 2017-12-18 NOTE — Progress Notes (Signed)
Patient ID: Paul Duncan, male   DOB: 18-Jan-1976, 42 y.o.   MRN: [id number] Patient was admitted early this morning for chest pain and positive troponins and started on heparin drip and cardiology was consulted.  Patient seen and examined at bedside and plan of care discussed with him.  Currently has very minimal intermittent chest pain.  Cardiology planning to do cardiac cath today.  Will follow further recommendations.  Repeat labs in a.m.

## 2017-12-18 NOTE — Consult Note (Addendum)
[address]       [place] [address]             [phone]        Paul Duncan [place] Medical Record #[id number] Date of Birth: 06-06-1975  Referring: Kathlyn Sacramento Primary Care: [name], PA Primary Cardiologist:No primary care provider on file.  Chief Complaint:    Chief Complaint  Patient presents with  . Chest Pain  . Shortness of Breath    History of Present Illness:    The patient is a 42 year old male who presented to the emergency room on 12/18/2017 with complaints of chest pain.  The pain was primarily retrosternal without radiation and was also associated with some shortness of breath.  Is persisted for several hours.  There was no nausea vomiting, diaphoresis or symptoms of syncope/presyncope.  He called EMS and was brought to the emergency room where the pain was initially relieved with sublingual nitroglycerin.  He ruled in for non-ST segment elevation myocardial infarction and was started on heparin.  Initial troponin was less than 0.30 and peaked at 5.00.    He has a history of hypertension, as well as a family history of early coronary artery disease.  He also has a history of brain tumor resection and previous seizures in 2007 at [facility] , followed with yearly MRI.  He does not smoke cigarettes.  Hemoglobin A1c is noted to be 5.3 and has a history of prediabetes.  His lipid profile reveals a very poor triglyceride to HDL ratio at 200/37.  His LDL C is 147.  .  Cardiac catheterization was performed today and reveals severe multivessel coronary artery disease.    Patient currently on heparin and ntg, with out chest pain. He denies history of previous chest pain or symptoms of CHF.  Current Activity/ Functional Status: Patient is independent with mobility/ambulation, transfers, ADL's, IADL's.   Zubrod Score: At the time of surgery this patient's most appropriate activity status/level should be described as: [x]     0    Normal  activity, no symptoms []     1    Restricted in physical strenuous activity but ambulatory, able to do out light work []     2    Ambulatory and capable of self care, unable to do work activities, up and about                 more than 50%  Of the time                            []     3    Only limited self care, in bed greater than 50% of waking hours []     4    Completely disabled, no self care, confined to bed or chair []     5    Moribund  Past Medical History:  Diagnosis Date  . Brain tumor ([place])   . Hypertension   . Seizures ([place])     Past Surgical History:  Procedure Laterality Date  . BRAIN SURGERY  [id number]    Social History   Tobacco Use  Smoking Status Never Smoker  Smokeless Tobacco Never Used    Social History   Substance and Sexual Activity  Alcohol Use No     No Known Allergies    Family History: Patient's father had coronary artery bypass grafting at age 74 he is now in  his early 22s and doing well Patient had one brother who died suddenly at his desk from "failed valve" the details of this are unknown   Current Facility-Administered Medications  Medication Dose Route Frequency Provider Last Rate Last Dose  . 0.9 %  sodium chloride infusion  250 mL Intravenous PRN Kathlyn Sacramento A, MD      . acetaminophen (TYLENOL) tablet 650 mg  650 mg Oral Q4H PRN [name], MD      . aspirin EC tablet 81 mg  81 mg Oral Daily Kathlyn Sacramento A, MD   81 mg at 12/18/17 1007  . atorvastatin (LIPITOR) tablet 80 mg  80 mg Oral q1800 Kathlyn Sacramento A, MD      . carvedilol (COREG) tablet 3.125 mg  3.125 mg Oral BID WC Arida, Muhammad A, MD      . heparin ADULT infusion 100 units/mL (25000 units/260mL sodium chloride 0.45%)  1,350 Units/hr Intravenous Continuous Alekh, Kshitiz, MD      . Derrill Memo ON 12/19/2017] Influenza vac split quadrivalent PF (FLUARIX) injection 0.5 mL  0.5 mL Intramuscular Tomorrow-1000 Arida, Muhammad A, MD      . losartan (COZAAR) tablet 25 mg  25  mg Oral Daily Arida, Muhammad A, MD      . morphine 2 MG/ML injection 2 mg  2 mg Intravenous Q2H PRN Kathlyn Sacramento A, MD   2 mg at 12/18/17 1433  . nitroGLYCERIN (NITROSTAT) SL tablet 0.4 mg  0.4 mg Sublingual Q5 Min x 3 PRN Kathlyn Sacramento A, MD   0.4 mg at 12/18/17 0320  . ondansetron (ZOFRAN) injection 4 mg  4 mg Intravenous Q6H PRN Kathlyn Sacramento A, MD      . sodium chloride flush (NS) 0.9 % injection 3 mL  3 mL Intravenous Q12H Arida, Muhammad A, MD      . sodium chloride flush (NS) 0.9 % injection 3 mL  3 mL Intravenous PRN [name], MD        Medications Prior to Admission  Medication Sig Dispense Refill Last Dose  . amLODipine (NORVASC) 10 MG tablet Take 10 mg by mouth daily.  5 12/17/2017 at Unknown time  . aspirin 81 MG tablet Take 81 mg by mouth daily.   12/17/2017 at Unknown time  . hydrochlorothiazide (HYDRODIURIL) 25 MG tablet Take 25 mg by mouth daily.   12/17/2017 at Unknown time  . cephALEXin (KEFLEX) 500 MG capsule Take 1 capsule (500 mg total) by mouth 4 (four) times daily. (Patient not taking: Reported on 12/18/2017) 21 capsule 0 Completed Course at Unknown time  . mupirocin ointment (BACTROBAN) 2 % Apply to burn tid for 5-7d (Patient not taking: Reported on 12/18/2017) 22 g 0 Completed Course at Unknown time     Review of Systems:   Review of Systems  Constitutional: Positive for malaise/fatigue. Negative for chills, diaphoresis, fever and weight loss.  HENT: Negative for congestion, ear discharge, ear pain, hearing loss, nosebleeds, sinus pain, sore throat and tinnitus.   Eyes: Negative for blurred vision, double vision, photophobia, pain and discharge.  Respiratory: Positive for shortness of breath. Negative for cough, hemoptysis, sputum production, wheezing and stridor.   Cardiovascular: Positive for chest pain. Negative for palpitations, orthopnea, claudication, leg swelling and PND.  Gastrointestinal: Negative for abdominal pain, blood in stool,  constipation, diarrhea, heartburn, melena, nausea and vomiting.  Genitourinary: Negative for dysuria, flank pain, frequency, hematuria and urgency.       Told he has one kidney, no history of  renal failure/insuff  Musculoskeletal: Negative for back pain, falls, joint pain, myalgias and neck pain.  Skin: Negative for itching and rash.  Neurological: Positive for seizures and loss of consciousness. Negative for dizziness, tingling, tremors, sensory change, speech change, focal weakness, weakness and headaches.       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 Negative for polydipsia. Bruises/bleeds easily.  Psychiatric/Behavioral: Negative for depression, hallucinations, memory loss, substance abuse and suicidal ideas. The patient is not nervous/anxious and does not have insomnia.       Physical Exam: BP 129/82   Pulse 91   Temp 98.4 F (36.9 C) (Oral)   Resp 18   Ht 5\' 5"  (1.651 m)   Wt 94.4 kg   SpO2 98%   BMI 34.63 kg/m    Physical Exam  Constitutional: He is oriented to person, place, and time. He appears well-developed and well-nourished. No distress.  obese  HENT:  Head: Normocephalic and atraumatic.  Nose: Nose normal.  Mouth/Throat: Oropharynx is clear and moist. No oropharyngeal exudate.  Eyes: Pupils are equal, round, and reactive to light. Conjunctivae and EOM are normal. Right eye exhibits no discharge. Left eye exhibits no discharge. No scleral icterus.  Neck: Normal range of motion. Neck supple. No JVD present. No tracheal deviation present. No thyromegaly present.  Cardiovascular: Normal rate, regular rhythm, normal heart sounds and intact distal pulses. Exam reveals no gallop and no friction rub.  No murmur heard. Pulmonary/Chest: Effort normal and breath sounds normal. No stridor. No respiratory distress. He has no wheezes. He has no rales. He exhibits no tenderness.    Abdominal: Soft. Bowel sounds are normal. He exhibits no distension and no mass. There is no tenderness. There is no rebound and no guarding.  Musculoskeletal: Normal range of motion. He exhibits no edema, tenderness or deformity.  Lymphadenopathy:    He has no cervical adenopathy.  Neurological: He is alert and oriented to person, place, and time. He has normal reflexes.  Skin: Skin is warm and dry. No rash noted. He is not diaphoretic. No erythema. No pallor.  Psychiatric: He has a normal mood and affect. His behavior is normal. Judgment and thought content normal.   Diagnostic Studies & Laboratory data:     Recent Radiology Findings:   Dg Chest 2 View  Result Date: 12/18/2017 CLINICAL DATA:  Mid chest pain and shortness of breath. EXAM: CHEST - 2 VIEW COMPARISON:  04/25/2012 FINDINGS: Mildly low lung volumes. The lungs appear clear. Cardiac and mediastinal margins appear normal. No pleural effusion. IMPRESSION: 1.  No active cardiopulmonary disease is radiographically apparent. Electronically Signed   By: Van Clines M.D.   On: 12/18/2017 02:55     I have independently reviewed the above radiologic studies and discussed with the patient   Recent Lab Findings: Lab Results  Component Value Date   WBC 21.4 (H) 12/18/2017   HGB 16.1 12/18/2017   HCT 45.9 12/18/2017   PLT 337 12/18/2017   GLUCOSE 120 (H) 12/18/2017   CHOL 224 (H) 12/18/2017   TRIG 200 (H) 12/18/2017   HDL 37 (L) 12/18/2017   LDLCALC 147 (H) 12/18/2017   NA 133 (L) 12/18/2017   K 3.6 12/18/2017   CL 99 12/18/2017   CREATININE 0.84 12/18/2017   BUN 14 12/18/2017   CO2 24 12/18/2017   HGBA1C 5.3 12/18/2017    Cath:    There is moderate to severe left ventricular systolic dysfunction.  LV end diastolic pressure is moderately elevated.  The left ventricular ejection fraction is 25-35% by visual estimate.  Mid RCA lesion is 60% stenosed.  Ost 2nd Mrg lesion is 100% stenosed.  Ost 1st Mrg lesion  is 30% stenosed.  Mid LAD lesion is 100% stenosed.  Prox LAD lesion is 99% stenosed.   1.  Significant three-vessel coronary artery disease as outlined above. 2.  Severely reduced LV systolic function with an EF of 25 to 35%. 3.  Moderately elevated left ventricular end-diastolic pressure.  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  If the mid to distal LAD myocardium is viable, I think his best option is CABG.  If in the other hand there is no viable myocardium in that area, PCI of proximal LAD plus fractional flow reserve guided revascularization of the RCA can be considered.    I     Coronary Findings   Diagnostic  Dominance: Right  Left Anterior Descending  Collaterals  Dist LAD filled by collaterals from Post Atrio.    Prox LAD lesion 99% stenosed  Prox LAD lesion is 99% stenosed.  Mid LAD lesion 100% stenosed  Mid LAD lesion is 100% stenosed.  Second Diagonal Branch  Vessel is large in size. There is mild disease in the vessel.  Left Circumflex  First Obtuse Marginal Branch  Ost 1st Mrg lesion 30% stenosed  Ost 1st Mrg lesion is 30% stenosed.  Second Obtuse Marginal Branch  Ost 2nd Mrg lesion 100% stenosed  Ost 2nd Mrg lesion is 100% stenosed.  Right Coronary Artery  Mid RCA lesion 60% stenosed  Mid RCA lesion is 60% stenosed.  Right Posterior Descending Artery  Vessel is angiographically normal.  First Right Posterolateral  Vessel is angiographically normal.  Second Right Posterolateral  Vessel is angiographically normal.  Intervention   No interventions have been documented.  Wall Motion   Resting               Left Heart   Left Ventricle The left ventricle is moderately dilated. There is moderate to severe left ventricular systolic dysfunction. LV end diastolic pressure is moderately elevated. The left ventricular ejection fraction is 25-35% by visual  estimate. There are LV function abnormalities due to segmental dysfunction.  Coronary Diagrams   Diagnostic Diagram        ECHO: Transthoracic Echocardiography  Patient:    Paul Duncan, Paul Duncan MR #:       [id number] Study Date: 12/18/2017 Gender:     M Age:        89 Height:     165.1 cm Weight:     94.4 kg BSA:        2.12 m^2 Pt. Status: Room:       2C13C   ADMITTING    Rise Patience  SONOGRAPHER  [facility]  PERFORMING   [facility], Inpatient  ATTENDING    [place], Kshitiz  Marliss Coots, Kshitiz  Irving Shows, Kshitiz  cc:  ------------------------------------------------------------------- LV EF: 40% -   45%  ------------------------------------------------------------------- Indications:      Chest pain 786.51.  ------------------------------------------------------------------- History:   PMH:   Angina pectoris.  PMH:   Myocardial infarction. Risk factors:  Hypertension.  ------------------------------------------------------------------- Study Conclusions  - Left ventricle: The cavity size was normal. There was mild   concentric hypertrophy. Systolic function was mildly to  moderately reduced. The estimated ejection fraction was in the   range of 40% to 45%. There is akinesis of the apicalanterior and   apical myocardium. There is akinesis of the midanteroseptal   myocardium. - Pulmonary arteries: Systolic pressure could not be accurately   estimated.  ------------------------------------------------------------------- Study data:  No prior study was available for comparison.  Study status:  Routine.  Procedure:  Transthoracic echocardiography. Image quality was adequate. Intravenous contrast (Definity) was administered.          Transthoracic echocardiography.  M-mode, complete 2D, spectral Doppler, and color Doppler.  Birthdate: Patient birthdate: 09/05/1975.  Age:  Patient is 42 yr old.  Sex: Gender: male.    BMI: 34.6  kg/m^2.  Blood pressure:     130/94 Patient status:  Inpatient.  Study date:  Study date: 12/18/2017. Study time: 11:25 AM.  Location:  Bedside.  -------------------------------------------------------------------  ------------------------------------------------------------------- Left ventricle:  The cavity size was normal. There was mild concentric hypertrophy. Systolic function was mildly to moderately reduced. The estimated ejection fraction was in the range of 40% to 45%.  Regional wall motion abnormalities:   There is akinesis of the apicalanterior and apical myocardium.  There is akinesis of the midanteroseptal myocardium.  ------------------------------------------------------------------- Aortic valve:   Trileaflet; normal thickness leaflets. Mobility was not restricted.  Doppler:  Transvalvular velocity was within the normal range. There was no stenosis. There was no regurgitation.   ------------------------------------------------------------------- Aorta:  Aortic root: The aortic root was normal in size.  ------------------------------------------------------------------- Mitral valve:   Structurally normal valve.   Mobility was not restricted.  Doppler:  Transvalvular velocity was within the normal range. There was no evidence for stenosis. There was no regurgitation.    Peak gradient (D): 2 mm Hg.  ------------------------------------------------------------------- Left atrium:  The atrium was normal in size.  ------------------------------------------------------------------- Right ventricle:  The cavity size was normal. Wall thickness was normal. Systolic function was normal.  ------------------------------------------------------------------- Pulmonic valve:    Structurally normal valve.   Cusp separation was normal.  Doppler:  Transvalvular velocity was within the normal range. There was no evidence for stenosis. There was  no regurgitation.  ------------------------------------------------------------------- Tricuspid valve:   Structurally normal valve.    Doppler: Transvalvular velocity was within the normal range. There was no regurgitation.  ------------------------------------------------------------------- Pulmonary artery:   The main pulmonary artery was normal-sized. Systolic pressure could not be accurately estimated.  ------------------------------------------------------------------- Right atrium:  The atrium was normal in size.  ------------------------------------------------------------------- Pericardium:  There was no pericardial effusion.  ------------------------------------------------------------------- Systemic veins: Inferior vena cava: The vessel was normal in size.  ------------------------------------------------------------------- Measurements   Left ventricle                         Value        Reference  LV ID, ED, PLAX chordal        (L)     42.6  mm     43 - 52  LV ID, ES, PLAX chordal                31.2  mm     23 - 38  LV fx shortening, PLAX chordal (L)     27    %      >=29  LV PW thickness, ED                    12    mm     ----------  IVS/LV PW  ratio, ED                    0.93         <=1.3  LV e&', lateral                         11.2  cm/s   ----------  LV E/e&', lateral                       6.96         ----------  LV e&', medial                          7.4   cm/s   ----------  LV E/e&', medial                        10.54        ----------  LV e&', average                         9.3   cm/s   ----------  LV E/e&', average                       8.39         ----------    Ventricular septum                     Value        Reference  IVS thickness, ED                      11.1  mm     ----------    LVOT                                   Value        Reference  LVOT ID, S                             20    mm     ----------  LVOT area                               3.14  cm^2   ----------    Aorta                                  Value        Reference  Aortic root ID, ED                     28    mm     ----------    Left atrium                            Value        Reference  LA ID, A-P, ES                         37    mm     ----------  LA ID/bsa, A-P                         1.75  cm/m^2 <=2.2  LA volume, S                           55.4  ml     ----------  LA volume/bsa, S                       26.1  ml/m^2 ----------  LA volume, ES, 1-p A4C                 41.8  ml     ----------  LA volume/bsa, ES, 1-p A4C             19.7  ml/m^2 ----------  LA volume, ES, 1-p A2C                 65.9  ml     ----------  LA volume/bsa, ES, 1-p A2C             31.1  ml/m^2 ----------    Mitral valve                           Value        Reference  Mitral E-wave peak velocity            78    cm/s   ----------  Mitral A-wave peak velocity            60.8  cm/s   ----------  Mitral deceleration time               169   ms     150 - 230  Mitral peak gradient, D                2     mm Hg  ----------  Mitral E/A ratio, peak                 1.3          ----------    Right atrium                           Value        Reference  RA ID, S-I, ES, A4C                    42.5  mm     34 - 49  RA area, ES, A4C                       12.1  cm^2   8.3 - 19.5  RA volume, ES, A/L                     29.4  ml     ----------  RA volume/bsa, ES, A/L                 13.9  ml/m^2 ----------    Systemic veins                         Value        Reference  Estimated CVP  3     mm Hg  ----------    Right ventricle                        Value        Reference  TAPSE                                  21.9  mm     ----------  RV s&', lateral, S                      11.9  cm/s   ----------  Legend: (L)  and  (H)  mark values outside specified reference  range.  ------------------------------------------------------------------- Prepared and Electronically Authenticated by  Fransico Him, MD 2019-10-09T16:39:07  Assessment / Plan: Non-STEMI myocardial infarction with severe three-vessel coronary artery disease Dyslipidemia Family history of sudden death in her brother at age 48 Severe three-vessel coronary artery disease with poor quality targets especially the distal LAD and ejection fraction 25% with anterior hypokinesis  As noted by cardiology at the time of catheterization patient needs a viability study Start on aggressive treatment for LV dysfunction Decide on coronary artery bypass grafting depending on viability-see cardiac cath note  [name] MD      [address] [place] [address] Office [phone]   [place]

## 2017-12-18 NOTE — H&P (Signed)
History and Physical    Paul Duncan ATF:[id number] DOB: 01-24-1976 DOA: 12/18/2017  PCP: [name], PA  Patient coming from: Home.  Chief Complaint: Chest pain.  HPI: Paul Duncan is a 42 y.o. male with history of hypertension and previous history of brain tumor status post resection and used to be on antiseizure medication and to the resection presents to the ER with complaints of chest pain.  Patient started developing retrosternal chest pressure since last night which has been persistent with mild shortness of breath.  Denies any nausea vomiting diaphoresis palpitation.  Since the pain persisted patient called EMS at midnight and was brought to the ER.  ED Course: In the ER patient chest pain improved with sublingual nitroglycerin.  Troponin is mildly elevated EKG shows normal sinus rhythm chest x-ray unremarkable.  Patient admitted for chest pain likely from non-ST elevation MI.  Review of Systems: As per HPI, rest all negative.   Past Medical History:  Diagnosis Date  . Brain tumor ([place])   . Hypertension   . Seizures ([place])     Past Surgical History:  Procedure Laterality Date  . BRAIN SURGERY    . BRAIN SURGERY  may2007     reports that he has never smoked. He has never used smokeless tobacco. He reports that he does not drink alcohol or use drugs.  No Known Allergies  Family History  Problem Relation Age of Onset  . Diabetes Mother   . Heart disease Father     Prior to Admission medications   Medication Sig Start Date End Date Taking? Authorizing Provider  amLODipine (NORVASC) 10 MG tablet Take 10 mg by mouth daily. 12/13/17  Yes [provider]  aspirin 81 MG tablet Take 81 mg by mouth daily.   Yes [provider]  hydrochlorothiazide (HYDRODIURIL) 25 MG tablet Take 25 mg by mouth daily.   Yes [provider]  cephALEXin (KEFLEX) 500 MG capsule Take 1 capsule (500 mg total) by mouth 4 (four) times daily. Patient not  taking: Reported on 12/18/2017 09/17/15   Leandrew Koyanagi, MD  mupirocin ointment Drue Stager) 2 % Apply to burn tid for 5-7d Patient not taking: Reported on 12/18/2017 09/17/15   Leandrew Koyanagi, MD    Physical Exam: Vitals:   12/18/17 0221 12/18/17 0315 12/18/17 0330 12/18/17 0336  BP: (!) 139/91 119/84 90/65 104/66  Pulse: 100 95 (!) 108 96  Resp: 16 (!) 22 20 15   Temp: 98.1 F (36.7 C)     TempSrc: Oral     SpO2: 96% 100% 95% 95%  Weight: 93 kg     Height: 5\' 5"  (1.651 m)         Constitutional: Moderately built and nourished. Vitals:   12/18/17 0221 12/18/17 0315 12/18/17 0330 12/18/17 0336  BP: (!) 139/91 119/84 90/65 104/66  Pulse: 100 95 (!) 108 96  Resp: 16 (!) 22 20 15   Temp: 98.1 F (36.7 C)     TempSrc: Oral     SpO2: 96% 100% 95% 95%  Weight: 93 kg     Height: 5\' 5"  (1.651 m)      Eyes: Anicteric no pallor. ENMT: No discharge from the ears eyes nose or mouth. Neck: No mass felt.  No JVD appreciated. Respiratory: No rhonchi or crepitations. Cardiovascular: S1-S2 heard no murmurs appreciated. Abdomen: Soft nontender bowel sounds present. Musculoskeletal: No edema.  No joint effusion. Skin: No rash.  Skin appears warm. Neurologic: Alert awake oriented to  time place and person.  Moves all extremities. Psychiatric: Appears normal.  Normal affect.   Labs on Admission: I have personally reviewed following labs and imaging studies  CBC: Recent Labs  Lab 12/18/17 0234  WBC 19.1*  HGB 17.2*  HCT 48.3  MCV 83.3  PLT 606   Basic Metabolic Panel: Recent Labs  Lab 12/18/17 0234  NA 133*  K 2.8*  CL 98  CO2 25  GLUCOSE 147*  BUN 16  CREATININE 0.87  CALCIUM 8.7*   GFR: Estimated Creatinine Clearance: 114.7 mL/min (by C-G formula based on SCr of 0.87 mg/dL). Liver Function Tests: No results for input(s): AST, ALT, ALKPHOS, BILITOT, PROT, ALBUMIN in the last 168 hours. No results for input(s): LIPASE, AMYLASE in the last 168 hours. No results  for input(s): AMMONIA in the last 168 hours. Coagulation Profile: No results for input(s): INR, PROTIME in the last 168 hours. Cardiac Enzymes: No results for input(s): CKTOTAL, CKMB, CKMBINDEX, TROPONINI in the last 168 hours. BNP (last 3 results) No results for input(s): PROBNP in the last 8760 hours. HbA1C: No results for input(s): HGBA1C in the last 72 hours. CBG: Recent Labs  Lab 12/18/17 0312  GLUCAP 137*   Lipid Profile: No results for input(s): CHOL, HDL, LDLCALC, TRIG, CHOLHDL, LDLDIRECT in the last 72 hours. Thyroid Function Tests: No results for input(s): TSH, T4TOTAL, FREET4, T3FREE, THYROIDAB in the last 72 hours. Anemia Panel: No results for input(s): VITAMINB12, FOLATE, FERRITIN, TIBC, IRON, RETICCTPCT in the last 72 hours. Urine analysis: No results found for: COLORURINE, APPEARANCEUR, LABSPEC, PHURINE, GLUCOSEU, HGBUR, BILIRUBINUR, KETONESUR, PROTEINUR, UROBILINOGEN, NITRITE, LEUKOCYTESUR Sepsis Labs: @LABRCNTIP (procalcitonin:4,lacticidven:4) )No results found for this or any previous visit (from the past 240 hour(s)).   Radiological Exams on Admission: Dg Chest 2 View  Result Date: 12/18/2017 CLINICAL DATA:  Mid chest pain and shortness of breath. EXAM: CHEST - 2 VIEW COMPARISON:  04/25/2012 FINDINGS: Mildly low lung volumes. The lungs appear clear. Cardiac and mediastinal margins appear normal. No pleural effusion. IMPRESSION: 1.  No active cardiopulmonary disease is radiographically apparent. Electronically Signed   By: Van Clines M.D.   On: 12/18/2017 02:55    EKG: Independently reviewed.  Normal sinus rhythm.  Initial EKG was showing sinus tachycardia.  Assessment/Plan Principal Problem:   Chest pain Active Problems:   Essential hypertension   Hypokalemia    1. Non-ST elevation MI -patient has been started on heparin infusion and sublingual nitroglycerin and Lipitor metoprolol.  We will cycle cardiac markers.  Will keep patient n.p.o. in  anticipation of possible cardiac cath cardiology has been notified.  Morphine as needed. 2. Hypertension -we will continue lisinopril but hold hydrochlorothiazide due to hypokalemia.  Patient also has been started on metoprolol. 3. Hypokalemia likely from diuretics.  Replace and recheck.  Check magnesium levels.   DVT prophylaxis: Heparin infusion. Code Status: Full code. Family Communication: Discussed with patient. Disposition Plan: Home. Consults called: Cardiology. Admission status: Observation.   Rise Patience MD [facility] Pager [phone].  If 7PM-7AM, please contact night-coverage [email or web address] Password [email or web address]  12/18/2017, 4:07 AM

## 2017-12-18 NOTE — Progress Notes (Signed)
ANTICOAGULATION CONSULT NOTE - Initial Consult  Pharmacy Consult for Heparin  Indication: chest pain/ACS  No Known Allergies  Patient Measurements: Height: 5\' 5"  (165.1 cm) Weight: 205 lb (93 kg) IBW/kg (Calculated) : 61.5  Vital Signs: Temp: 98.1 F (36.7 C) (10/09 0221) Temp Source: Oral (10/09 0221) BP: 104/66 (10/09 0336) Pulse Rate: 96 (10/09 0336)  Labs: Recent Labs    12/18/17 0234  HGB 17.2*  HCT 48.3  PLT 311  CREATININE 0.87    Estimated Creatinine Clearance: 114.7 mL/min (by C-G formula based on SCr of 0.87 mg/dL).  Medical History: Past Medical History:  Diagnosis Date  . Brain tumor ([place])   . Hypertension   . Seizures ([place])    Assessment: 42 y/o M with chest pain and shortness of breath, mildly elevated point of care troponin, starting heparin per pharmacy, CBC good, renal function good, PTA meds reviewed.   Goal of Therapy:  Heparin level 0.3-0.7 units/ml Monitor platelets by anticoagulation protocol: Yes   Plan:  Heparin 4000 units BOLUS Start heparin drip at 1200 units/hr 1100 HL Daily CBC/HL Monitor for bleeding   Narda Bonds 12/18/2017,4:05 AM

## 2017-12-18 NOTE — ED Triage Notes (Signed)
Pt BIB EMS from home with c/o CPressure & SOB. Per EMS, pt reports CP began around 2100 this evening after eating soup. Pt states pain wouldn't go away so he called EMS. States he's "felt run down the past few days". Pain center chest without radiation. Lung sounds clear with no apparent dyspnea upon arrival.

## 2017-12-19 ENCOUNTER — Inpatient Hospital Stay (HOSPITAL_COMMUNITY): Payer: Managed Care, Other (non HMO)

## 2017-12-19 ENCOUNTER — Encounter (HOSPITAL_COMMUNITY): Payer: Self-pay | Admitting: Cardiovascular Disease

## 2017-12-19 DIAGNOSIS — I255 Ischemic cardiomyopathy: Secondary | ICD-10-CM

## 2017-12-19 DIAGNOSIS — Z01810 Encounter for preprocedural cardiovascular examination: Secondary | ICD-10-CM

## 2017-12-19 DIAGNOSIS — E7800 Pure hypercholesterolemia, unspecified: Secondary | ICD-10-CM

## 2017-12-19 LAB — CBC WITH DIFFERENTIAL/PLATELET
ABS IMMATURE GRANULOCYTES: 0.1 10*3/uL — AB (ref 0.00–0.07)
Basophils Absolute: 0 10*3/uL (ref 0.0–0.1)
Basophils Relative: 0 %
EOS PCT: 0 %
Eosinophils Absolute: 0 10*3/uL (ref 0.0–0.5)
HEMATOCRIT: 46.2 % (ref 39.0–52.0)
HEMOGLOBIN: 15.4 g/dL (ref 13.0–17.0)
Immature Granulocytes: 1 %
LYMPHS PCT: 9 %
Lymphs Abs: 1.6 10*3/uL (ref 0.7–4.0)
MCH: 28.8 pg (ref 26.0–34.0)
MCHC: 33.3 g/dL (ref 30.0–36.0)
MCV: 86.5 fL (ref 80.0–100.0)
MONO ABS: 1.8 10*3/uL — AB (ref 0.1–1.0)
MONOS PCT: 10 %
NEUTROS ABS: 14.3 10*3/uL — AB (ref 1.7–7.7)
Neutrophils Relative %: 80 %
Platelets: 309 10*3/uL (ref 150–400)
RBC: 5.34 MIL/uL (ref 4.22–5.81)
RDW: 12 % (ref 11.5–15.5)
WBC: 17.9 10*3/uL — ABNORMAL HIGH (ref 4.0–10.5)
nRBC: 0 % (ref 0.0–0.2)

## 2017-12-19 LAB — PULMONARY FUNCTION TEST
DL/VA % pred: 103 %
DL/VA: 4.57 ml/min/mmHg/L
DLCO cor % pred: 89 %
DLCO cor: 24.2 ml/min/mmHg
DLCO unc % pred: 91 %
DLCO unc: 24.73 ml/min/mmHg
FEF 25-75 Post: 3.33 L/sec
FEF 25-75 Pre: 3.48 L/sec
FEF2575-%Change-Post: -4 %
FEF2575-%Pred-Post: 97 %
FEF2575-%Pred-Pre: 101 %
FEV1-%Change-Post: 0 %
FEV1-%Pred-Post: 94 %
FEV1-%Pred-Pre: 94 %
FEV1-Post: 3.41 L
FEV1-Pre: 3.42 L
FEV1FVC-%Change-Post: 0 %
FEV1FVC-%Pred-Pre: 104 %
FEV6-%Change-Post: 0 %
FEV6-%Pred-Post: 94 %
FEV6-%Pred-Pre: 93 %
FEV6-Post: 4.16 L
FEV6-Pre: 4.14 L
FEV6FVC-%Change-Post: 0 %
FEV6FVC-%Pred-Post: 102 %
FEV6FVC-%Pred-Pre: 103 %
FVC-%Change-Post: 0 %
FVC-%Pred-Post: 91 %
FVC-%Pred-Pre: 90 %
FVC-Post: 4.17 L
FVC-Pre: 4.14 L
Post FEV1/FVC ratio: 82 %
Post FEV6/FVC ratio: 100 %
Pre FEV1/FVC ratio: 82 %
Pre FEV6/FVC Ratio: 100 %
RV % pred: 100 %
RV: 1.71 L
TLC % pred: 98 %
TLC: 6.04 L

## 2017-12-19 LAB — BASIC METABOLIC PANEL
Anion gap: 10 (ref 5–15)
BUN: 13 mg/dL (ref 6–20)
CO2: 20 mmol/L — AB (ref 22–32)
CREATININE: 0.83 mg/dL (ref 0.61–1.24)
Calcium: 8.5 mg/dL — ABNORMAL LOW (ref 8.9–10.3)
Chloride: 105 mmol/L (ref 98–111)
GFR calc Af Amer: >60 mL/min (ref >60)
GFR calc non Af Amer: >60 mL/min (ref >60)
GLUCOSE: 117 mg/dL — AB (ref 70–99)
POTASSIUM: 3.7 mmol/L (ref 3.5–5.1)
Sodium: 135 mmol/L (ref 135–145)

## 2017-12-19 LAB — HEPARIN LEVEL (UNFRACTIONATED)
Heparin Unfractionated: 0.28 IU/mL — ABNORMAL LOW (ref 0.30–0.70)
Heparin Unfractionated: 0.37 IU/mL (ref 0.30–0.70)

## 2017-12-19 LAB — MAGNESIUM: MAGNESIUM: 2 mg/dL (ref 1.7–2.4)

## 2017-12-19 MED ORDER — CARVEDILOL 6.25 MG PO TABS
6.2500 mg | ORAL_TABLET | Freq: Two times a day (BID) | ORAL | Status: DC
  Administered 2017-12-19 – 2017-12-21 (×5): 6.25 mg via ORAL
  Filled 2017-12-19 (×5): qty 1

## 2017-12-19 MED ORDER — ALBUTEROL SULFATE (2.5 MG/3ML) 0.083% IN NEBU
2.5000 mg | INHALATION_SOLUTION | Freq: Once | RESPIRATORY_TRACT | Status: AC
  Administered 2017-12-19: 2.5 mg via RESPIRATORY_TRACT

## 2017-12-19 MED ORDER — GADOBUTROL 1 MMOL/ML IV SOLN
15.0000 mL | Freq: Once | INTRAVENOUS | Status: AC | PRN
  Administered 2017-12-19: 15 mL via INTRAVENOUS

## 2017-12-19 NOTE — Progress Notes (Signed)
[address]       [place] [address]             [phone]                 1 Day Post-Op Procedure(s) (LRB): LEFT HEART CATH AND CORONARY ANGIOGRAPHY (N/A)  LOS: 1 day   Subjective: No chest pain   Objective: Vital signs in last 24 hours: Patient Vitals for the past 24 hrs:  BP Temp Temp src Pulse Resp SpO2  12/19/17 1132 129/84 98.5 F (36.9 C) Oral 94 (!) 22 97 %  12/19/17 0741 121/79 98.3 F (36.8 C) Oral 84 16 98 %  12/19/17 0300 117/76 98.9 F (37.2 C) Oral 89 (!) 25 94 %  12/18/17 2300 109/76 - - 90 20 94 %  12/18/17 1950 107/66 99.4 F (37.4 C) Oral (!) 101 14 96 %  12/18/17 1655 132/89 98.5 F (36.9 C) Oral 90 (!) 25 93 %  12/18/17 1600 132/89 - - 86 (!) 21 96 %  12/18/17 1545 120/88 - - 85 19 95 %  12/18/17 1530 118/89 - - - - -  12/18/17 1515 120/88 - - - - -    Filed Weights   12/18/17 0221 12/18/17 0519  Weight: 93 kg 94.4 kg    Hemodynamic parameters for last 24 hours:    Intake/Output from previous day: 10/09 0701 - 10/10 0700 In: 586.5 [P.O.:440; I.V.:146.5] Out: 425 [Urine:425] Intake/Output this shift: Total I/O In: 480 [P.O.:480] Out: 0   Scheduled Meds: . aspirin EC  81 mg Oral Daily  . atorvastatin  80 mg Oral q1800  . carvedilol  6.25 mg Oral BID WC  . Influenza vac split quadrivalent PF  0.5 mL Intramuscular Tomorrow-1000  . losartan  25 mg Oral Daily  . sodium chloride flush  3 mL Intravenous Q12H   Continuous Infusions: . sodium chloride    . heparin 1,350 Units/hr (12/19/17 0639)   PRN Meds:.sodium chloride, acetaminophen, morphine injection, ondansetron (ZOFRAN) IV, sodium chloride flush  General appearance: alert and cooperative Neurologic: intact Heart: regular rate and rhythm, S1, S2 normal, no murmur, click, rub or gallop Lungs: clear to auscultation bilaterally Abdomen: soft, non-tender; bowel sounds normal; no masses,  no organomegaly Extremities: extremities normal, atraumatic, no  cyanosis or edema and Homans sign is negative, no sign of DVT  Lab Results: CBC: Recent Labs    12/18/17 0421 12/19/17 0249  WBC 21.4* 17.9*  HGB 16.1 15.4  HCT 45.9 46.2  PLT 337 309   BMET:  Recent Labs    12/18/17 0839 12/19/17 0249  NA 133* 135  K 3.6 3.7  CL 99 105  CO2 24 20*  GLUCOSE 120* 117*  BUN 14 13  CREATININE 0.84 0.83  CALCIUM 8.8* 8.5*    PT/INR: No results for input(s): LABPROT, INR in the last 72 hours.   Radiology Dg Chest 2 View  Result Date: 12/18/2017 CLINICAL DATA:  Mid chest pain and shortness of breath. EXAM: CHEST - 2 VIEW COMPARISON:  04/25/2012 FINDINGS: Mildly low lung volumes. The lungs appear clear. Cardiac and mediastinal margins appear normal. No pleural effusion. IMPRESSION: 1.  No active cardiopulmonary disease is radiographically apparent. Electronically Signed   By: Van Clines M.D.   On: 12/18/2017 02:55     Assessment/Plan: S/P Procedure(s) (LRB): LEFT HEART CATH AND CORONARY ANGIOGRAPHY (N/A) waiting for viability study / mri not done yet  Unexplained elevated wbc , 17,000-  21,000   [name] MD 12/19/2017 3:00 PM     Patient ID: Paul Duncan, male   DOB: 25-May-1975, 42 y.o.   MRN: [id number]

## 2017-12-19 NOTE — Progress Notes (Signed)
Progress Note  Patient Name: Paul Duncan Date of Encounter: 12/19/2017  Primary Cardiologist: No primary care provider on file. New. Dr. Martinique  Subjective   Feels well. No chest pain or SOB.   Inpatient Medications    Scheduled Meds: . albuterol  2.5 mg Nebulization Once  . aspirin EC  81 mg Oral Daily  . atorvastatin  80 mg Oral q1800  . carvedilol  6.25 mg Oral BID WC  . Influenza vac split quadrivalent PF  0.5 mL Intramuscular Tomorrow-1000  . losartan  25 mg Oral Daily  . sodium chloride flush  3 mL Intravenous Q12H   Continuous Infusions: . sodium chloride    . heparin 1,350 Units/hr (12/19/17 1610)   PRN Meds: sodium chloride, acetaminophen, morphine injection, ondansetron (ZOFRAN) IV, sodium chloride flush   Vital Signs    Vitals:   12/18/17 1950 12/18/17 2300 12/19/17 0300 12/19/17 0741  BP: 107/66 109/76 117/76 121/79  Pulse: (!) 101 90 89 84  Resp: 14 20 (!) 25 16  Temp: 99.4 F (37.4 C)  98.9 F (37.2 C) 98.3 F (36.8 C)  TempSrc: Oral  Oral Oral  SpO2: 96% 94% 94% 98%  Weight:      Height:        Intake/Output Summary (Last 24 hours) at 12/19/2017 0809 Last data filed at 12/19/2017 0700 Gross per 24 hour  Intake 562.47 ml  Output 425 ml  Net 137.47 ml   Filed Weights   12/18/17 0221 12/18/17 0519  Weight: 93 kg 94.4 kg    Telemetry    NSR, sinus tachy - Personally Reviewed  ECG    NSR, LAFB, poor anterior R wave progression - Personally Reviewed  Physical Exam   GEN: No acute distress.   Neck: No JVD Cardiac: RRR, no murmurs, rubs, or gallops.  Respiratory: Clear to auscultation bilaterally. GI: Soft, nontender, non-distended  MS: No edema; No deformity. Radial cath site without hematoma Neuro:  Nonfocal  Psych: Normal affect   Labs    Chemistry Recent Labs  Lab 12/18/17 0234 12/18/17 0421 12/18/17 0839 12/19/17 0249  NA 133*  --  133* 135  K 2.8*  --  3.6 3.7  CL 98  --  99 105  CO2 25  --  24 20*    GLUCOSE 147*  --  120* 117*  BUN 16  --  14 13  CREATININE 0.87 0.94 0.84 0.83  CALCIUM 8.7*  --  8.8* 8.5*  GFRNONAA >60 >60 >60 >60  GFRAA >60 >60 >60 >60  ANIONGAP 10  --  10 10     Hematology Recent Labs  Lab 12/18/17 0234 12/18/17 0421 12/19/17 0249  WBC 19.1* 21.4* 17.9*  RBC 5.80 5.51 5.34  HGB 17.2* 16.1 15.4  HCT 48.3 45.9 46.2  MCV 83.3 83.3 86.5  MCH 29.7 29.2 28.8  MCHC 35.6 35.1 33.3  RDW 11.7 11.7 12.0  PLT 311 337 309    Cardiac Enzymes Recent Labs  Lab 12/18/17 0421 12/18/17 0839 12/18/17 1624  TROPONINI 2.40* 5.00* 5.58*    Recent Labs  Lab 12/18/17 0240  TROPIPOC 0.45*     BNPNo results for input(s): BNP, PROBNP in the last 168 hours.   DDimer  Recent Labs  Lab 12/18/17 0421  DDIMER 0.27     Radiology    Dg Chest 2 View  Result Date: 12/18/2017 CLINICAL DATA:  Mid chest pain and shortness of breath. EXAM: CHEST - 2 VIEW COMPARISON:  04/25/2012 FINDINGS:  Mildly low lung volumes. The lungs appear clear. Cardiac and mediastinal margins appear normal. No pleural effusion. IMPRESSION: 1.  No active cardiopulmonary disease is radiographically apparent. Electronically Signed   By: Van Clines M.D.   On: 12/18/2017 02:55    Cardiac Studies   Echo 12/18/17: Study Conclusions  - Left ventricle: The cavity size was normal. There was mild   concentric hypertrophy. Systolic function was mildly to   moderately reduced. The estimated ejection fraction was in the   range of 40% to 45%. There is akinesis of the apicalanterior and   apical myocardium. There is akinesis of the midanteroseptal   myocardium. - Pulmonary arteries: Systolic pressure could not be accurately   estimated. LEFT HEART CATH AND CORONARY ANGIOGRAPHY  Conclusion     There is moderate to severe left ventricular systolic dysfunction.  LV end diastolic pressure is moderately elevated.  The left ventricular ejection fraction is 25-35% by visual estimate.  Mid  RCA lesion is 60% stenosed.  Ost 2nd Mrg lesion is 100% stenosed.  Ost 1st Mrg lesion is 30% stenosed.  Mid LAD lesion is 100% stenosed.  Prox LAD lesion is 99% stenosed.   1.  Significant three-vessel coronary artery disease as outlined above. 2.  Severely reduced LV systolic function with an EF of 25 to 35%. 3.  Moderately elevated left ventricular end-diastolic pressure.  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  If the mid to distal LAD myocardium is viable, I think his best option is CABG.  If in the other hand there is no viable myocardium in that area, PCI of proximal LAD plus fractional flow reserve guided revascularization of the RCA can be considered.      Patient Profile     42 y.o. male with history of HTN, HLD, family history of premature CAD presents with NSTEMI  Assessment & Plan    1. NSTEMI. Peak troponin 5.58. No dynamic Ecg findings. Cardiac cath and Echo data reviewed. He has a critical lesion in the proximal LAD and then the LAD is occluded in the mid vessel with right to left collaterals. The second OM is occluded with left to left collaterals. The RCA has a 60% mid vessel lesion. LV function is at least moderately reduced and the distal anteroseptum and apex appear akinetic. On ASA, IV heparin. Will increase Coreg. The question is whether he would be best served by CABG or PCI. Will obtain MRI viability study today. If the anterior wall is viable he would probably do best with CABG. Would need to graft the mid LAD proximal to the occlusion as well as distally. If there is no significant viability in the distal LAD territory then a PCI approach would be reasonable. This would included stenting of the proximal LAD and potentially the OM as well as FFR evaluation of the RCA lesion. He is getting PFTs, pre CABG dopplers and MRI today. Will review data and discuss.  2. HTN  good control 3. HLD started on high dose statin.    For questions or updates, please contact [place] Please consult [email or web address] for contact info under        Signed, Paul Hayashida Martinique, MD  12/19/2017, 8:09 AM

## 2017-12-19 NOTE — Progress Notes (Signed)
Pre-op Cardiac Surgery  Carotid Findings: 1-39% ICA plaquing. Vertebral artery flow is antegrade.     Upper Extremity Right Left  Brachial Pressures Restricted Triphasic 127T  Radial Waveforms T T  Ulnar Waveforms T T  Palmar Arch (Allen's Test) Doppler signal reverses with radial compression and remains normal with ulnar compression Doppler signal remains normal with radial compression and reverses with ulnar compression       Lower  Extremity Right Left  Dorsalis Pedis T T  Anterior Tibial    Posterior Tibial T T  Ankle/Brachial Indices

## 2017-12-19 NOTE — Progress Notes (Signed)
[place] for Heparin  Indication: chest pain/ACS  No Known Allergies  Patient Measurements: Height: 5\' 5"  (165.1 cm) Weight: 208 lb 1.8 oz (94.4 kg) IBW/kg (Calculated) : 61.5  Vital Signs: Temp: 98.3 F (36.8 C) (10/10 0741) Temp Source: Oral (10/10 0741) BP: 121/79 (10/10 0741) Pulse Rate: 84 (10/10 0741)  Labs: Recent Labs    12/18/17 0234 12/18/17 0421 12/18/17 0839 12/18/17 1040 12/18/17 1624 12/19/17 0249 12/19/17 0730  HGB 17.2* 16.1  --   --   --  15.4  --   HCT 48.3 45.9  --   --   --  46.2  --   PLT 311 337  --   --   --  309  --   HEPARINUNFRC  --   --   --  0.18*  --  0.28* 0.37  CREATININE 0.87 0.94 0.84  --   --  0.83  --   TROPONINI  --  2.40* 5.00*  --  5.58*  --   --     Estimated Creatinine Clearance: 121.3 mL/min (by C-G formula based on SCr of 0.83 mg/dL).  Medical History: Past Medical History:  Diagnosis Date  . Brain tumor ([place])   . Hypertension   . Seizures [facility])    Assessment: 42 y/o M with chest pain and shortness of breath s/p cath w/ 3VCAD for PCI vs CABG.  -heparin level = 0.37   Goal of Therapy:  Heparin level 0.3-0.7 units/ml Monitor platelets by anticoagulation protocol: Yes   Plan:  -No heparin changes needed -Daily heparin level and CBC -Will follow procedural plans  Hildred Laser, PharmD Clinical Pharmacist Please check Amion for pharmacy contact number

## 2017-12-19 NOTE — Progress Notes (Signed)
ANTICOAGULATION CONSULT NOTE - Follow Up Consult  Pharmacy Consult for heparin Indication: CAD  Labs: Recent Labs    12/18/17 0234 12/18/17 0421 12/18/17 0839 12/18/17 1040 12/18/17 1624 12/19/17 0249  HGB 17.2* 16.1  --   --   --  15.4  HCT 48.3 45.9  --   --   --  46.2  PLT 311 337  --   --   --  309  HEPARINUNFRC  --   --   --  0.18*  --  0.28*  CREATININE 0.87 0.94 0.84  --   --  0.83  TROPONINI  --  2.40* 5.00*  --  5.58*  --     Assessment/Plan:  42yo male slightly subtherapeutic on heparin after resuming post-cath though lab was drawn ~5h after resuming and likely needs more time to accumulate. Will continue gtt at current rate for now and check additional level.   Wynona Neat, PharmD, BCPS  12/19/2017,4:16 AM

## 2017-12-19 NOTE — Progress Notes (Signed)
Patient ID: Paul Duncan, male   DOB: [birth year], 42 y.o.   MRN: [id number] Patient had cardiac catheterization yesterday and has significant three-vessel coronary artery disease and EF of 25 to 35%.  CT surgery has been consulted.  I spoke to Dr. Jordan/cardiology on phone and cardiology team will take over as the primary for this patient.  Hospitalist service will sign off.

## 2017-12-20 LAB — BASIC METABOLIC PANEL
ANION GAP: 12 (ref 5–15)
BUN: 18 mg/dL (ref 6–20)
CALCIUM: 8.3 mg/dL — AB (ref 8.9–10.3)
CO2: 22 mmol/L (ref 22–32)
Chloride: 101 mmol/L (ref 98–111)
Creatinine, Ser: 0.85 mg/dL (ref 0.61–1.24)
GFR calc non Af Amer: >60 mL/min (ref >60)
Glucose, Bld: 97 mg/dL (ref 70–99)
Potassium: 3.5 mmol/L (ref 3.5–5.1)
SODIUM: 135 mmol/L (ref 135–145)

## 2017-12-20 LAB — HEPARIN LEVEL (UNFRACTIONATED)
HEPARIN UNFRACTIONATED: 0.19 [IU]/mL — AB (ref 0.30–0.70)
HEPARIN UNFRACTIONATED: 0.26 [IU]/mL — AB (ref 0.30–0.70)
HEPARIN UNFRACTIONATED: 0.27 [IU]/mL — AB (ref 0.30–0.70)

## 2017-12-20 NOTE — Progress Notes (Signed)
Progress Note  Patient Name: Paul Duncan Date of Encounter: 12/20/2017  Primary Cardiologist: No primary care provider on file. New. Dr. Martinique  Subjective   Feels well. No chest pain or SOB.   Inpatient Medications    Scheduled Meds: . aspirin EC  81 mg Oral Daily  . atorvastatin  80 mg Oral q1800  . carvedilol  6.25 mg Oral BID WC  . Influenza vac split quadrivalent PF  0.5 mL Intramuscular Tomorrow-1000  . losartan  25 mg Oral Daily  . sodium chloride flush  3 mL Intravenous Q12H   Continuous Infusions: . sodium chloride    . heparin 1,500 Units/hr (12/20/17 0452)   PRN Meds: sodium chloride, acetaminophen, morphine injection, ondansetron (ZOFRAN) IV, sodium chloride flush   Vital Signs    Vitals:   12/19/17 1947 12/19/17 2309 12/20/17 0338 12/20/17 0741  BP: 105/71 115/78 118/77 116/77  Pulse: 84 88 88 90  Resp: 14 (!) 25 19 13   Temp: 98.9 F (37.2 C) 99.2 F (37.3 C) 98.3 F (36.8 C) 98.1 F (36.7 C)  TempSrc: Oral Oral Oral Oral  SpO2: 96% 95% 96% 95%  Weight:      Height:        Intake/Output Summary (Last 24 hours) at 12/20/2017 0845 Last data filed at 12/20/2017 0817 Gross per 24 hour  Intake 616.45 ml  Output 1590 ml  Net -973.55 ml   Filed Weights   12/18/17 0221 12/18/17 0519  Weight: 93 kg 94.4 kg    Telemetry    NSR- Personally Reviewed  ECG    NSR, LAFB, poor anterior R wave progression - Personally Reviewed  Physical Exam   GEN: No acute distress.   Neck: No JVD Cardiac: RRR, no murmurs, rubs, or gallops.  Respiratory: Clear to auscultation bilaterally. GI: Soft, nontender, non-distended  MS: No edema; No deformity. Radial cath site without hematoma Neuro:  Nonfocal  Psych: Normal affect   Labs    Chemistry Recent Labs  Lab 12/18/17 0839 12/19/17 0249 12/20/17 0315  NA 133* 135 135  K 3.6 3.7 3.5  CL 99 105 101  CO2 24 20* 22  GLUCOSE 120* 117* 97  BUN 14 13 18   CREATININE 0.84 0.83 0.85  CALCIUM  8.8* 8.5* 8.3*  GFRNONAA >60 >60 >60  GFRAA >60 >60 >60  ANIONGAP 10 10 12      Hematology Recent Labs  Lab 12/18/17 0234 12/18/17 0421 12/19/17 0249  WBC 19.1* 21.4* 17.9*  RBC 5.80 5.51 5.34  HGB 17.2* 16.1 15.4  HCT 48.3 45.9 46.2  MCV 83.3 83.3 86.5  MCH 29.7 29.2 28.8  MCHC 35.6 35.1 33.3  RDW 11.7 11.7 12.0  PLT 311 337 309    Cardiac Enzymes Recent Labs  Lab 12/18/17 0421 12/18/17 0839 12/18/17 1624  TROPONINI 2.40* 5.00* 5.58*    Recent Labs  Lab 12/18/17 0240  TROPIPOC 0.45*     BNPNo results for input(s): BNP, PROBNP in the last 168 hours.   DDimer  Recent Labs  Lab 12/18/17 0421  DDIMER 0.27     Radiology    No results found.  Cardiac Studies   Echo 12/18/17: Study Conclusions  - Left ventricle: The cavity size was normal. There was mild   concentric hypertrophy. Systolic function was mildly to   moderately reduced. The estimated ejection fraction was in the   range of 40% to 45%. There is akinesis of the apicalanterior and   apical myocardium. There is akinesis of  the midanteroseptal   myocardium. - Pulmonary arteries: Systolic pressure could not be accurately   estimated. LEFT HEART CATH AND CORONARY ANGIOGRAPHY  Conclusion     There is moderate to severe left ventricular systolic dysfunction.  LV end diastolic pressure is moderately elevated.  The left ventricular ejection fraction is 25-35% by visual estimate.  Mid RCA lesion is 60% stenosed.  Ost 2nd Mrg lesion is 100% stenosed.  Ost 1st Mrg lesion is 30% stenosed.  Mid LAD lesion is 100% stenosed.  Prox LAD lesion is 99% stenosed.   1.  Significant three-vessel coronary artery disease as outlined above. 2.  Severely reduced LV systolic function with an EF of 25 to 35%. 3.  Moderately elevated left ventricular end-diastolic pressure.  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  If the mid to distal LAD myocardium is viable, I think his best option is CABG.  If in the other hand there is no viable myocardium in that area, PCI of proximal LAD plus fractional flow reserve guided revascularization of the RCA can be considered.      Patient Profile     42 y.o. male with history of HTN, HLD, family history of premature CAD presents with NSTEMI  Assessment & Plan    1. NSTEMI. Peak troponin 5.58. No dynamic Ecg findings. Cardiac cath and Echo data reviewed. He has a critical lesion in the proximal LAD and then the LAD is occluded in the mid vessel with right to left collaterals. The second OM is occluded with left to left collaterals. The RCA has a 60% mid vessel lesion. LV function is at least moderately reduced and the distal anteroseptum and apex appear akinetic. On ASA, IV heparin. Will increase Coreg.  Patient had a MRI viability study done. Report is pending but I discussed with Dr. Aundra Dubin who reviewed his images. There is substantial viability of the anterior wall with only a small area of scar at the apex. Given this finding we feel that his best treatment option will be to have CABG. Also discussed with Dr. Servando Snare. Plan for surgery next week. PFTs and carotid dopplers look good.   2. HTN good control 3. HLD started on high dose statin.    For questions or updates, please contact [place] Please consult [email or web address] for contact info under        Signed, Samba Cumba Martinique, MD  12/20/2017, 8:45 AM

## 2017-12-20 NOTE — Care Management Note (Signed)
Case Management Note  Patient Details  Name: Paul Duncan MRN: [id number] Date of Birth: 05-24-1975  Subjective/Objective:   Pt admitted with CP - plan is for CABG week of 10/14                Action/Plan:  PTA from home..   Expected Discharge Date:                  Expected Discharge Plan:     In-House Referral:  Clinical Social Work  Discharge planning Services  CM Consult  Post Acute Care Choice:    Choice offered to:     DME Arranged:    DME Agency:     HH Arranged:    HH Agency:     Status of Service:     If discussed at [employer], dates discussed:    Additional Comments:  Maryclare Labrador, RN 12/20/2017, 9:26 AM

## 2017-12-20 NOTE — Progress Notes (Signed)
ANTICOAGULATION CONSULT NOTE  Pharmacy Consult for Heparin  Indication: chest pain/ACS  No Known Allergies  Patient Measurements: Height: 5\' 5"  (165.1 cm) Weight: 208 lb 1.8 oz (94.4 kg) IBW/kg (Calculated) : 61.5  Vital Signs: Temp: 98.1 F (36.7 C) (10/11 0741) Temp Source: Oral (10/11 0741) BP: 116/77 (10/11 0741) Pulse Rate: 90 (10/11 0741)  Labs: Recent Labs    12/18/17 0234 12/18/17 0421 12/18/17 0839  12/18/17 1624 12/19/17 0249 12/19/17 0730 12/20/17 0315 12/20/17 1206  HGB 17.2* 16.1  --   --   --  15.4  --   --   --   HCT 48.3 45.9  --   --   --  46.2  --   --   --   PLT 311 337  --   --   --  309  --   --   --   HEPARINUNFRC  --   --   --    < >  --  0.28* 0.37 0.19* 0.26*  CREATININE 0.87 0.94 0.84  --   --  0.83  --  0.85  --   TROPONINI  --  2.40* 5.00*  --  5.58*  --   --   --   --    < > = values in this interval not displayed.    Estimated Creatinine Clearance: 118.4 mL/min (by C-G formula based on SCr of 0.85 mg/dL).  Medical History: Past Medical History:  Diagnosis Date  . Brain tumor ([place])   . Hypertension   . Seizures [facility])    Assessment: 42 y/o M with chest pain and shortness of breath s/p cath w/ 3VCAD for  CABG next week.  -heparin level = 0.26   Goal of Therapy:  Heparin level 0.3-0.7 units/ml Monitor platelets by anticoagulation protocol: Yes   Plan:  -Increase heparin to 1650 units/hr -Heparin level in 6 hours and daily wth CBC daily   Hildred Laser, PharmD Clinical Pharmacist Please check Amion for pharmacy contact number

## 2017-12-20 NOTE — Progress Notes (Signed)
CARDIAC REHAB PHASE I   Preop education completed with pt. Pt given IS, demonstrated 2500. Reviewed sternal precautions. Pt given In-the-tube sheet, OHS care guide, and Cardiac Surgery booklet. Pt states he lives alone but will be able to have people stay with him after discharge. Pt denies further questions at this time. Will continue to follow throughout hospital stay.  [id number] Rufina Falco, RN BSN 12/20/2017 10:08 AM

## 2017-12-20 NOTE — Progress Notes (Signed)
ANTICOAGULATION CONSULT NOTE  Pharmacy Consult for Heparin  Indication: chest pain/ACS  No Known Allergies  Patient Measurements: Height: 5\' 5"  (165.1 cm) Weight: 208 lb 1.8 oz (94.4 kg) IBW/kg (Calculated) : 61.5  Vital Signs: Temp: 98.3 F (36.8 C) (10/11 0338) Temp Source: Oral (10/11 0338) BP: 118/77 (10/11 0338) Pulse Rate: 88 (10/11 0338)  Labs: Recent Labs    12/18/17 0234 12/18/17 0421 12/18/17 0839  12/18/17 1624 12/19/17 0249 12/19/17 0730 12/20/17 0315  HGB 17.2* 16.1  --   --   --  15.4  --   --   HCT 48.3 45.9  --   --   --  46.2  --   --   PLT 311 337  --   --   --  309  --   --   HEPARINUNFRC  --   --   --    < >  --  0.28* 0.37 0.19*  CREATININE 0.87 0.94 0.84  --   --  0.83  --   --   TROPONINI  --  2.40* 5.00*  --  5.58*  --   --   --    < > = values in this interval not displayed.    Estimated Creatinine Clearance: 121.3 mL/min (by C-G formula based on SCr of 0.83 mg/dL).  Assessment: 42 y.o. male with CAD awaiting possible CABG for heparin  Goal of Therapy:  Heparin level 0.3-0.7 units/ml Monitor platelets by anticoagulation protocol: Yes   Plan:  Increase Heparin 1500 units/hr Check heparin level in 8 hours.  Phillis Knack, PharmD, BCPS

## 2017-12-20 NOTE — Progress Notes (Signed)
Patient ID: LEMAR BAKOS, male   DOB: 01-May-1975, 42 y.o.   MRN: [id number]      [address]       [place] [address]             [phone]                 2 Days Post-Op Procedure(s) (LRB): LEFT HEART CATH AND CORONARY ANGIOGRAPHY (N/A)  LOS: 2 days   Subjective: No chest pain on current iv therpy  Objective: Vital signs in last 24 hours: Patient Vitals for the past 24 hrs:  BP Temp Temp src Pulse Resp SpO2  12/20/17 0741 116/77 98.1 F (36.7 C) Oral 90 13 95 %  12/20/17 0338 118/77 98.3 F (36.8 C) Oral 88 19 96 %  12/19/17 2309 115/78 99.2 F (37.3 C) Oral 88 (!) 25 95 %  12/19/17 1947 105/71 98.9 F (37.2 C) Oral 84 14 96 %  12/19/17 1530 118/74 98.9 F (37.2 C) Oral 92 14 95 %  12/19/17 1132 129/84 98.5 F (36.9 C) Oral 94 (!) 22 97 %    Filed Weights   12/18/17 0221 12/18/17 0519  Weight: 93 kg 94.4 kg    Hemodynamic parameters for last 24 hours:    Intake/Output from previous day: 10/10 0701 - 10/11 0700 In: 616.5 [P.O.:480; I.V.:136.5] Out: 1350 [Urine:1350] Intake/Output this shift: Total I/O In: -  Out: 240 [Urine:240]  Scheduled Meds: . aspirin EC  81 mg Oral Daily  . atorvastatin  80 mg Oral q1800  . carvedilol  6.25 mg Oral BID WC  . Influenza vac split quadrivalent PF  0.5 mL Intramuscular Tomorrow-1000  . losartan  25 mg Oral Daily  . sodium chloride flush  3 mL Intravenous Q12H   Continuous Infusions: . sodium chloride    . heparin 1,500 Units/hr (12/20/17 0452)   PRN Meds:.sodium chloride, acetaminophen, morphine injection, ondansetron (ZOFRAN) IV, sodium chloride flush  General appearance: alert and cooperative Neurologic: intact Heart: regular rate and rhythm, S1, S2 normal, no murmur, click, rub or gallop Lungs: clear to auscultation bilaterally Abdomen: soft, non-tender; bowel sounds normal; no masses,  no organomegaly  Lab Results: CBC: Recent Labs    12/18/17 0421 12/19/17 0249  WBC 21.4* 17.9*   HGB 16.1 15.4  HCT 45.9 46.2  PLT 337 309   BMET:  Recent Labs    12/19/17 0249 12/20/17 0315  NA 135 135  K 3.7 3.5  CL 105 101  CO2 20* 22  GLUCOSE 117* 97  BUN 13 18  CREATININE 0.83 0.85  CALCIUM 8.5* 8.3*    PT/INR: No results for input(s): LABPROT, INR in the last 72 hours.   Radiology No results found.   Assessment/Plan: S/P Procedure(s) (LRB): LEFT HEART CATH AND CORONARY ANGIOGRAPHY (N/A) Mobilize Discussed preliminary results of MRI with Dr Martinique and patient Plan CABG next week   [name] MD 12/20/2017 9:14 AM

## 2017-12-20 NOTE — Progress Notes (Signed)
ANTICOAGULATION CONSULT NOTE  Pharmacy Consult for Heparin  Indication: chest pain/ACS  No Known Allergies  Patient Measurements: Height: 5\' 5"  (165.1 cm) Weight: 208 lb 1.8 oz (94.4 kg) IBW/kg (Calculated) : 61.5  Vital Signs: Temp: 98.4 F (36.9 C) (10/11 2028) Temp Source: Oral (10/11 2028) BP: 111/71 (10/11 2028) Pulse Rate: 85 (10/11 2028)  Labs: Recent Labs    12/18/17 0234 12/18/17 0421 12/18/17 0839  12/18/17 1624 12/19/17 0249  12/20/17 0315 12/20/17 1206 12/20/17 2028  HGB 17.2* 16.1  --   --   --  15.4  --   --   --   --   HCT 48.3 45.9  --   --   --  46.2  --   --   --   --   PLT 311 337  --   --   --  309  --   --   --   --   HEPARINUNFRC  --   --   --    < >  --  0.28*   < > 0.19* 0.26* 0.27*  CREATININE 0.87 0.94 0.84  --   --  0.83  --  0.85  --   --   TROPONINI  --  2.40* 5.00*  --  5.58*  --   --   --   --   --    < > = values in this interval not displayed.    Estimated Creatinine Clearance: 118.4 mL/min (by C-G formula based on SCr of 0.85 mg/dL).  Medical History: Past Medical History:  Diagnosis Date  . Brain tumor ([place])   . Hypertension   . Seizures [facility])    Assessment: 42 y/o M with chest pain and shortness of breath s/p cath w/ 3VCAD for  CABG next week.  -heparin level = 0.27   Goal of Therapy:  Heparin level 0.3-0.7 units/ml Monitor platelets by anticoagulation protocol: Yes   Plan:  -Increase heparin to 1850 units/hr -Heparin level in 6 hours and daily wth CBC daily  Erin Hearing PharmD., BCPS Clinical Pharmacist 12/20/2017 9:26 PM

## 2017-12-21 DIAGNOSIS — I200 Unstable angina: Secondary | ICD-10-CM

## 2017-12-21 LAB — URINALYSIS, ROUTINE W REFLEX MICROSCOPIC
Bacteria, UA: NONE SEEN
Bilirubin Urine: NEGATIVE
Glucose, UA: NEGATIVE mg/dL
Ketones, ur: NEGATIVE mg/dL
Leukocytes, UA: NEGATIVE
Nitrite: NEGATIVE
Protein, ur: NEGATIVE mg/dL
Specific Gravity, Urine: 1.004 — ABNORMAL LOW (ref 1.005–1.030)
pH: 6 (ref 5.0–8.0)

## 2017-12-21 LAB — BASIC METABOLIC PANEL
ANION GAP: 10 (ref 5–15)
BUN: 15 mg/dL (ref 6–20)
CALCIUM: 8.5 mg/dL — AB (ref 8.9–10.3)
CO2: 24 mmol/L (ref 22–32)
Chloride: 102 mmol/L (ref 98–111)
Creatinine, Ser: 0.99 mg/dL (ref 0.61–1.24)
GFR calc Af Amer: >60 mL/min (ref >60)
GLUCOSE: 93 mg/dL (ref 70–99)
Potassium: 3.5 mmol/L (ref 3.5–5.1)
SODIUM: 136 mmol/L (ref 135–145)

## 2017-12-21 LAB — HEPARIN LEVEL (UNFRACTIONATED): Heparin Unfractionated: 0.48 IU/mL (ref 0.30–0.70)

## 2017-12-21 MED ORDER — CARVEDILOL 12.5 MG PO TABS
12.5000 mg | ORAL_TABLET | Freq: Two times a day (BID) | ORAL | Status: DC
  Administered 2017-12-21 – 2017-12-22 (×3): 12.5 mg via ORAL
  Filled 2017-12-21 (×3): qty 1

## 2017-12-21 NOTE — Progress Notes (Signed)
Progress Note  Patient Name: Paul Duncan Date of Encounter: 12/21/2017  Primary Cardiologist: new, Dr. Martinique  Subjective   No recurrent chest pain  Inpatient Medications    Scheduled Meds: . aspirin EC  81 mg Oral Daily  . atorvastatin  80 mg Oral q1800  . carvedilol  6.25 mg Oral BID WC  . Influenza vac split quadrivalent PF  0.5 mL Intramuscular Tomorrow-1000  . losartan  25 mg Oral Daily  . sodium chloride flush  3 mL Intravenous Q12H   Continuous Infusions: . sodium chloride    . heparin 1,850 Units/hr (12/21/17 0800)   PRN Meds: sodium chloride, acetaminophen, morphine injection, ondansetron (ZOFRAN) IV, sodium chloride flush   Vital Signs    Vitals:   12/21/17 0010 12/21/17 0459 12/21/17 0729 12/21/17 1108  BP: 107/70 119/72 114/74 123/80  Pulse: 88  86 87  Resp: 12  (!) 24 20  Temp: 98.5 F (36.9 C) 98.4 F (36.9 C) 98 F (36.7 C) 98 F (36.7 C)  TempSrc: Oral Oral Oral Oral  SpO2:  98% 98% 98%  Weight:      Height:        Intake/Output Summary (Last 24 hours) at 12/21/2017 1245 Last data filed at 12/21/2017 1200 Gross per 24 hour  Intake 1538 ml  Output 825 ml  Net 713 ml    I/O since admission: -32  Filed Weights   12/18/17 0221 12/18/17 0519  Weight: 93 kg 94.4 kg    Telemetry    Sinus  In the mid 80s- Personally Reviewed  ECG    ECG (independently read by me): ST 103; LAHB  Physical Exam   BP 123/80 (BP Location: Left Arm)   Pulse 87   Temp 98 F (36.7 C) (Oral)   Resp 20   Ht 5\' 5"  (1.651 m)   Wt 94.4 kg   SpO2 98%   BMI 34.63 kg/m  General: Alert, oriented, no distress.  Skin: normal turgor, no rashes, warm and dry HEENT: Normocephalic, atraumatic. Pupils equal round and reactive to light; sclera anicteric; extraocular muscles intact; Fundi ** Nose without nasal septal hypertrophy Mouth/Parynx benign; Mallinpatti scale 3 Neck: No JVD, no carotid bruits; normal carotid upstroke Lungs: clear to ausculatation  and percussion; no wheezing or rales Chest wall: without tenderness to palpitation Heart: PMI not displaced, RRR, s1 s2 normal, 1/6 systolic murmur, no diastolic murmur, no rubs, gallops, thrills, or heaves Abdomen: soft, nontender; no hepatosplenomehaly, BS+; abdominal aorta nontender and not dilated by palpation. Back: no CVA tenderness Pulses 2+ Musculoskeletal: full range of motion, normal strength, no joint deformities Extremities: no clubbing cyanosis or edema, Homan's sign negative  Neurologic: grossly nonfocal; Cranial nerves grossly wnl Psychologic: Normal mood and affect   Labs    Chemistry Recent Labs  Lab 12/19/17 0249 12/20/17 0315 12/21/17 0222  NA 135 135 136  K 3.7 3.5 3.5  CL 105 101 102  CO2 20* 22 24  GLUCOSE 117* 97 93  BUN 13 18 15   CREATININE 0.83 0.85 0.99  CALCIUM 8.5* 8.3* 8.5*  GFRNONAA >60 >60 >60  GFRAA >60 >60 >60  ANIONGAP 10 12 10      Hematology Recent Labs  Lab 12/18/17 0234 12/18/17 0421 12/19/17 0249  WBC 19.1* 21.4* 17.9*  RBC 5.80 5.51 5.34  HGB 17.2* 16.1 15.4  HCT 48.3 45.9 46.2  MCV 83.3 83.3 86.5  MCH 29.7 29.2 28.8  MCHC 35.6 35.1 33.3  RDW 11.7 11.7 12.0  PLT  311 337 309    Cardiac Enzymes Recent Labs  Lab 12/18/17 0421 12/18/17 0839 12/18/17 1624  TROPONINI 2.40* 5.00* 5.58*    Recent Labs  Lab 12/18/17 0240  TROPIPOC 0.45*     BNPNo results for input(s): BNP, PROBNP in the last 168 hours.   DDimer  Recent Labs  Lab 12/18/17 0421  DDIMER 0.27     Lipid Panel     Component Value Date/Time   CHOL 224 (H) 12/18/2017 0839   TRIG 200 (H) 12/18/2017 0839   HDL 37 (L) 12/18/2017 0839   CHOLHDL 6.1 12/18/2017 0839   VLDL 40 12/18/2017 0839   LDLCALC 147 (H) 12/18/2017 0839    Radiology    Mr Cardiac Morphology W Wo Contrast  Result Date: 12/20/2017 CLINICAL DATA:  Ischemic cardiomyopathy, assess for viability EXAM: CARDIAC MRI TECHNIQUE: The patient was scanned on a 1.5 Tesla GE magnet. A  dedicated cardiac coil was used. Functional imaging was done using Fiesta sequences. 2,3, and 4 chamber views were done to assess for RWMA's. Modified Simpson's rule using a short axis stack was used to calculate an ejection fraction on a dedicated work Conservation officer, nature. The patient received 15 cc of Gadavist. After 10 minutes inversion recovery sequences were used to assess for infiltration and scar tissue. FINDINGS: Limited images of the lung fields showed no gross abnormalities. Normal left ventricular size with mild LV hypertrophy. Mid to apical anterior, anteroseptal and inferoseptal hypokinesis. Apical lateral and apical inferior hypokinesis. Severe hypokinesis of the true apex. LV EF 45%. Normal right ventricular size and systolic function, EF 93%. Normal right and left atrial sizes. Trileaflet aortic valve with no stenosis, mild aortic insufficiency. No significant mitral regurgitation noted. Delayed enhancement imaging: Patchy <25% wall thickness subendocardial late gadolinium enhancement (LGE) in the mid to apical anterior, anteroseptal, and inferoseptal walls. Measurements: LVEDV 132 mL LVSV 59 mL LVEF 45% RVEDV 86 mL RVSV 51 mL RVEF 59% IMPRESSION: 1. Normal LV size with mild LV hypertrophy. EF 45% with wall motion abnormalities as noted above. 2.  Normal RV size and systolic function, EF 26%. 3. Delayed enhancement imaging was suggestive of viability of the hypokinetic wall segments. Dalton Mclean Electronically Signed   By: Loralie Champagne M.D.   On: 12/20/2017 17:43    Cardiac Studies   Echo 12/18/17: Study Conclusions  - Left ventricle: The cavity size was normal. There was mild concentric hypertrophy. Systolic function was mildly to moderately reduced. The estimated ejection fraction was in the range of 40% to 45%. There is akinesis of the apicalanterior and apical myocardium. There is akinesis of the midanteroseptal myocardium. - Pulmonary arteries: Systolic  pressure could not be accurately estimated.   LEFT HEART CATH AND CORONARY ANGIOGRAPHY  Conclusion     There is moderate to severe left ventricular systolic dysfunction.  LV end diastolic pressure is moderately elevated.  The left ventricular ejection fraction is 25-35% by visual estimate.  Mid RCA lesion is 60% stenosed.  Ost 2nd Mrg lesion is 100% stenosed.  Ost 1st Mrg lesion is 30% stenosed.  Mid LAD lesion is 100% stenosed.  Prox LAD lesion is 99% stenosed.  1. Significant three-vessel coronary artery disease as outlined above. 2. Severely reduced LV systolic function with an EF of 25 to 35%. 3. Moderately elevated left ventricular end-diastolic pressure.  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 If the mid  to distal LAD myocardium is viable, I think his best option is CABG. If in the other hand there is no viable myocardium in that area, PCI of proximal LAD plus fractional flow reserve guided revascularization of the RCA can be considered.     MR VIABILITY  FINDINGS: Limited images of the lung fields showed no gross abnormalities.  Normal left ventricular size with mild LV hypertrophy. Mid to apical anterior, anteroseptal and inferoseptal hypokinesis. Apical lateral and apical inferior hypokinesis. Severe hypokinesis of the true apex. LV EF 45%. Normal right ventricular size and systolic function, EF 34%. Normal right and left atrial sizes. Trileaflet aortic valve with no stenosis, mild aortic insufficiency. No significant mitral regurgitation noted.  Delayed enhancement imaging: Patchy <25% wall thickness subendocardial late gadolinium enhancement (LGE) in the mid to apical anterior, anteroseptal, and inferoseptal walls.  Measurements:  LVEDV 132 mL  LVSV 59 mL  LVEF 45%  RVEDV 86 mL  RVSV 51 mL  RVEF 59%  IMPRESSION: 1. Normal  LV size with mild LV hypertrophy. EF 45% with wall motion abnormalities as noted above.  2.  Normal RV size and systolic function, EF 28%.  3. Delayed enhancement imaging was suggestive of viability of the hypokinetic wall segments.   Patient Profile     42 y.o. male with history of HTN, HLD, family history of premature CAD presents with NSTEMI  Assessment & Plan    1. NSTEMI:  Troponoin 5.58.  Cath findings as above.  Viable myocardium,  will plan for CABG this week; seen by Dr. Servando Snare.  With resting HR in the mid 80s will titrate carvedilol to 12.5 mg bid. No recurrent anginal symptoms.   2. HLD: LDL 147; now on atorvastatin 80 mg.   3. HTN: BP controlled  4. Obesity: BMI 34; will need weight loss.  5. FH for premature CAD; father with CABG in his 49s as well.  Signed, Troy Sine, MD, [facility] 12/21/2017, 12:45 PM

## 2017-12-21 NOTE — Progress Notes (Signed)
CARDIAC REHAB PHASE I   PRE:  Rate/Rhythm: 92 SR  BP:  Supine:   Sitting: 117/79  Standing:    SaO2: 96% RA  MODE:  Ambulation: 340 ft   POST:  Rate/Rhythm: 103 ST  BP:  Supine:   Sitting: 122/79  Standing:    SaO2: 96% RA  [id number] Patient tolerated ambulation well without c/o. To chair after walk, VSS. Call bell within reach.  Sol Passer, MS, ACSM CEP

## 2017-12-21 NOTE — Progress Notes (Signed)
ANTICOAGULATION CONSULT NOTE  Pharmacy Consult for Heparin  Indication: chest pain/ACS  No Known Allergies  Patient Measurements: Height: 5\' 5"  (165.1 cm) Weight: 208 lb 1.8 oz (94.4 kg) IBW/kg (Calculated) : 61.5  Vital Signs: Temp: 98 F (36.7 C) (10/12 1108) Temp Source: Oral (10/12 1108) BP: 123/80 (10/12 1108) Pulse Rate: 87 (10/12 1108)  Labs: Recent Labs    12/18/17 1624 12/19/17 0249  12/20/17 0315 12/20/17 1206 12/20/17 2028 12/21/17 0222  HGB  --  15.4  --   --   --   --   --   HCT  --  46.2  --   --   --   --   --   PLT  --  309  --   --   --   --   --   HEPARINUNFRC  --  0.28*   < > 0.19* 0.26* 0.27* 0.48  CREATININE  --  0.83  --  0.85  --   --  0.99  TROPONINI 5.58*  --   --   --   --   --   --    < > = values in this interval not displayed.    Estimated Creatinine Clearance: 101.7 mL/min (by C-G formula based on SCr of 0.99 mg/dL).  Medical History: Past Medical History:  Diagnosis Date  . Brain tumor ([place])   . Hypertension   . Seizures [facility])    Assessment: 42 y/o M admitted with chest pain and shortness of breath s/p cath w/ 3VCAD plan for  CABG next week. Meds ok ASA, Statin, BB, arb Plan to continue heparin drip until OR> 1850 uts/hr HL 0.48 at goal, no bleeding noted   Goal of Therapy:  Heparin level 0.3-0.7 units/ml Monitor platelets by anticoagulation protocol: Yes   Plan:  Continue  heparin to 1850 units/hr -Heparin level, CBC daily  Bonnita Nasuti Pharm.D. CPP, BCPS Clinical Pharmacist [phone] 12/21/2017 3:19 PM

## 2017-12-22 LAB — COMPREHENSIVE METABOLIC PANEL
ALT: 20 U/L (ref 0–44)
AST: 21 U/L (ref 15–41)
Albumin: 3 g/dL — ABNORMAL LOW (ref 3.5–5.0)
Alkaline Phosphatase: 52 U/L (ref 38–126)
Anion gap: 9 (ref 5–15)
BUN: 11 mg/dL (ref 6–20)
CO2: 25 mmol/L (ref 22–32)
Calcium: 8.5 mg/dL — ABNORMAL LOW (ref 8.9–10.3)
Chloride: 104 mmol/L (ref 98–111)
Creatinine, Ser: 0.96 mg/dL (ref 0.61–1.24)
GFR calc Af Amer: >60 mL/min (ref >60)
GFR calc non Af Amer: >60 mL/min (ref >60)
Glucose, Bld: 98 mg/dL (ref 70–99)
Potassium: 3.6 mmol/L (ref 3.5–5.1)
Sodium: 138 mmol/L (ref 135–145)
Total Bilirubin: 0.5 mg/dL (ref 0.3–1.2)
Total Protein: 6.1 g/dL — ABNORMAL LOW (ref 6.5–8.1)

## 2017-12-22 LAB — CBC
HCT: 41.4 % (ref 39.0–52.0)
Hemoglobin: 14.4 g/dL (ref 13.0–17.0)
MCH: 29.8 pg (ref 26.0–34.0)
MCHC: 34.8 g/dL (ref 30.0–36.0)
MCV: 85.5 fL (ref 80.0–100.0)
Platelets: 295 10*3/uL (ref 150–400)
RBC: 4.84 MIL/uL (ref 4.22–5.81)
RDW: 11.9 % (ref 11.5–15.5)
WBC: 15.5 10*3/uL — ABNORMAL HIGH (ref 4.0–10.5)
nRBC: 0 % (ref 0.0–0.2)

## 2017-12-22 LAB — SURGICAL PCR SCREEN
MRSA, PCR: NEGATIVE
Staphylococcus aureus: NEGATIVE

## 2017-12-22 LAB — HEPARIN LEVEL (UNFRACTIONATED): HEPARIN UNFRACTIONATED: 0.61 [IU]/mL (ref 0.30–0.70)

## 2017-12-22 LAB — ABO/RH: ABO/RH(D): O POS

## 2017-12-22 LAB — PROTIME-INR
INR: 1.09
Prothrombin Time: 14 seconds (ref 11.4–15.2)

## 2017-12-22 MED ORDER — CHLORHEXIDINE GLUCONATE CLOTH 2 % EX PADS
6.0000 | MEDICATED_PAD | Freq: Once | CUTANEOUS | Status: AC
  Administered 2017-12-22: 6 via TOPICAL

## 2017-12-22 MED ORDER — DEXMEDETOMIDINE HCL IN NACL 400 MCG/100ML IV SOLN
0.1000 ug/kg/h | INTRAVENOUS | Status: AC
  Administered 2017-12-23: .2 ug/kg/h via INTRAVENOUS
  Filled 2017-12-22: qty 100

## 2017-12-22 MED ORDER — NITROGLYCERIN IN D5W 200-5 MCG/ML-% IV SOLN
2.0000 ug/min | INTRAVENOUS | Status: AC
  Administered 2017-12-23: 5 ug/min via INTRAVENOUS
  Filled 2017-12-22 (×2): qty 250

## 2017-12-22 MED ORDER — BISACODYL 5 MG PO TBEC
5.0000 mg | DELAYED_RELEASE_TABLET | Freq: Once | ORAL | Status: AC
  Administered 2017-12-22: 5 mg via ORAL
  Filled 2017-12-22: qty 1

## 2017-12-22 MED ORDER — METOPROLOL TARTRATE 12.5 MG HALF TABLET
12.5000 mg | ORAL_TABLET | Freq: Once | ORAL | Status: AC
  Administered 2017-12-23: 12.5 mg via ORAL
  Filled 2017-12-22: qty 1

## 2017-12-22 MED ORDER — DOPAMINE-DEXTROSE 3.2-5 MG/ML-% IV SOLN
0.0000 ug/kg/min | INTRAVENOUS | Status: AC
  Administered 2017-12-23: 3 ug/kg/min via INTRAVENOUS
  Filled 2017-12-22: qty 250

## 2017-12-22 MED ORDER — CHLORHEXIDINE GLUCONATE CLOTH 2 % EX PADS
6.0000 | MEDICATED_PAD | Freq: Once | CUTANEOUS | Status: AC
  Administered 2017-12-23: 6 via TOPICAL

## 2017-12-22 MED ORDER — INSULIN REGULAR(HUMAN) IN NACL 100-0.9 UT/100ML-% IV SOLN
INTRAVENOUS | Status: AC
  Administered 2017-12-23: 1 [IU]/h via INTRAVENOUS
  Filled 2017-12-22: qty 100

## 2017-12-22 MED ORDER — PLASMA-LYTE 148 IV SOLN
INTRAVENOUS | Status: AC
  Administered 2017-12-23: 500 mL
  Filled 2017-12-22 (×2): qty 2.5

## 2017-12-22 MED ORDER — SODIUM CHLORIDE 0.9 % IV SOLN
750.0000 mg | INTRAVENOUS | Status: DC
  Filled 2017-12-22 (×2): qty 750

## 2017-12-22 MED ORDER — SODIUM CHLORIDE 0.9 % IV SOLN
INTRAVENOUS | Status: DC
  Filled 2017-12-22: qty 30

## 2017-12-22 MED ORDER — TRANEXAMIC ACID 1000 MG/10ML IV SOLN
1.5000 mg/kg/h | INTRAVENOUS | Status: AC
  Administered 2017-12-23: 1.5 mg/kg/h via INTRAVENOUS
  Filled 2017-12-22: qty 25

## 2017-12-22 MED ORDER — MILRINONE LACTATE IN DEXTROSE 20-5 MG/100ML-% IV SOLN
0.3000 ug/kg/min | INTRAVENOUS | Status: AC
  Administered 2017-12-23: .3 ug/kg/min via INTRAVENOUS
  Filled 2017-12-22 (×2): qty 100

## 2017-12-22 MED ORDER — TRANEXAMIC ACID (OHS) PUMP PRIME SOLUTION
2.0000 mg/kg | INTRAVENOUS | Status: DC
  Filled 2017-12-22: qty 1.89

## 2017-12-22 MED ORDER — SODIUM CHLORIDE 0.9 % IV SOLN
1.5000 g | INTRAVENOUS | Status: AC
  Administered 2017-12-23: 1.5 g via INTRAVENOUS
  Filled 2017-12-22: qty 1.5

## 2017-12-22 MED ORDER — TRANEXAMIC ACID (OHS) BOLUS VIA INFUSION
15.0000 mg/kg | INTRAVENOUS | Status: AC
  Administered 2017-12-23: 1416 mg via INTRAVENOUS
  Filled 2017-12-22: qty 1416

## 2017-12-22 MED ORDER — VANCOMYCIN HCL 10 G IV SOLR
1500.0000 mg | INTRAVENOUS | Status: AC
  Administered 2017-12-23: 1500 mg via INTRAVENOUS
  Filled 2017-12-22: qty 1500

## 2017-12-22 MED ORDER — EPINEPHRINE PF 1 MG/ML IJ SOLN
0.0000 ug/min | INTRAVENOUS | Status: DC
  Filled 2017-12-22: qty 4

## 2017-12-22 MED ORDER — MAGNESIUM SULFATE 50 % IJ SOLN
40.0000 meq | INTRAMUSCULAR | Status: DC
  Filled 2017-12-22: qty 9.85

## 2017-12-22 MED ORDER — POTASSIUM CHLORIDE 2 MEQ/ML IV SOLN
80.0000 meq | INTRAVENOUS | Status: DC
  Filled 2017-12-22: qty 40

## 2017-12-22 MED ORDER — CHLORHEXIDINE GLUCONATE 0.12 % MT SOLN
15.0000 mL | Freq: Once | OROMUCOSAL | Status: AC
  Administered 2017-12-23: 15 mL via OROMUCOSAL
  Filled 2017-12-22: qty 15

## 2017-12-22 MED ORDER — TEMAZEPAM 15 MG PO CAPS
15.0000 mg | ORAL_CAPSULE | Freq: Once | ORAL | Status: AC | PRN
  Administered 2017-12-22: 15 mg via ORAL
  Filled 2017-12-22: qty 1

## 2017-12-22 MED ORDER — PHENYLEPHRINE HCL-NACL 20-0.9 MG/250ML-% IV SOLN
30.0000 ug/min | INTRAVENOUS | Status: AC
  Administered 2017-12-23: 25 ug/min via INTRAVENOUS
  Filled 2017-12-22: qty 250

## 2017-12-22 NOTE — Plan of Care (Signed)
  Problem: Education: Goal: Knowledge of General Education information will improve Description Including pain rating scale, medication(s)/side effects and non-pharmacologic comfort measures Outcome: Progressing   Problem: Coping: Goal: Level of anxiety will decrease Outcome: Progressing   Problem: Education: Goal: Understanding of CV disease, CV risk reduction, and recovery process will improve Outcome: Progressing Goal: Individualized Educational Video(s) Outcome: Progressing

## 2017-12-22 NOTE — Progress Notes (Signed)
ANTICOAGULATION CONSULT NOTE  Pharmacy Consult for Heparin  Indication: chest pain/ACS  No Known Allergies  Patient Measurements: Height: 5\' 5"  (165.1 cm) Weight: 208 lb 1.8 oz (94.4 kg) IBW/kg (Calculated) : 61.5  Vital Signs: Temp: 98.4 F (36.9 C) (10/13 0735) Temp Source: Oral (10/13 0735) BP: 114/78 (10/13 0735)  Labs: Recent Labs    12/20/17 0315  12/20/17 2028 12/21/17 0222 12/22/17 0208  HGB  --   --   --   --  14.4  HCT  --   --   --   --  41.4  PLT  --   --   --   --  295  LABPROT  --   --   --   --  14.0  INR  --   --   --   --  1.09  HEPARINUNFRC 0.19*   < > 0.27* 0.48 0.61  CREATININE 0.85  --   --  0.99 0.96   < > = values in this interval not displayed.    Estimated Creatinine Clearance: 104.8 mL/min (by C-G formula based on SCr of 0.96 mg/dL).  Medical History: Past Medical History:  Diagnosis Date  . Brain tumor ([place])   . Hypertension   . Seizures [facility])    Assessment: 42 y/o M admitted with chest pain and shortness of breath s/p cath w/ 3VCAD plan for  CABG sometime this week. Meds ok ASA, Statin, BB, arb Plan to continue heparin drip until OR> 1850 uts/hr HL 0.61 at goal, no bleeding noted  Goal of Therapy:  Heparin level 0.3-0.7 units/ml Monitor platelets by anticoagulation protocol: Yes   Plan:  Continue  heparin to 1850 units/hr -Heparin level, CBC daily  [place] Crane, Roylene Reason, [place] Pharmacist Phone [phone]  12/22/2017 11:15 AM

## 2017-12-22 NOTE — Progress Notes (Signed)
Patient ID: Paul Duncan, male   DOB: 1975/07/03, 42 y.o.   MRN: [id number]      [address]       [place] [address]             [phone]                 4 Days Post-Op Procedure(s) (LRB): LEFT HEART CATH AND CORONARY ANGIOGRAPHY (N/A)  LOS: 4 days   Subjective: Stable , no chest pain for cabg tomorrow  Objective: Vital signs in last 24 hours: Patient Vitals for the past 24 hrs:  BP Temp Temp src Pulse Resp SpO2  12/22/17 1133 106/77 98.3 F (36.8 C) Oral 84 18 98 %  12/22/17 0735 114/78 98.4 F (36.9 C) Oral - 20 96 %  12/22/17 0246 117/78 98.1 F (36.7 C) Oral - (!) 8 97 %  12/21/17 2300 106/69 98.3 F (36.8 C) Oral (!) 170 17 94 %  12/21/17 1932 96/63 98.5 F (36.9 C) Oral 83 (!) 22 96 %  12/21/17 1523 121/71 98.5 F (36.9 C) Oral 87 20 96 %    Filed Weights   12/18/17 0221 12/18/17 0519  Weight: 93 kg 94.4 kg    Hemodynamic parameters for last 24 hours:    Intake/Output from previous day: 10/12 0701 - 10/13 0700 In: 1165.5 [P.O.:800; I.V.:365.5] Out: 601 [Urine:601] Intake/Output this shift: Total I/O In: 300 [P.O.:300] Out: 300 [Urine:300]  Scheduled Meds: . aspirin EC  81 mg Oral Daily  . atorvastatin  80 mg Oral q1800  . carvedilol  12.5 mg Oral BID WC  . [START ON 12/23/2017] heparin-papaverine-plasmalyte irrigation   Irrigation To OR  . Influenza vac split quadrivalent PF  0.5 mL Intramuscular Tomorrow-1000  . [START ON 12/23/2017] insulin   Intravenous To OR  . losartan  25 mg Oral Daily  . [START ON 12/23/2017] magnesium sulfate  40 mEq Other To OR  . [START ON 12/23/2017] phenylephrine  30-200 mcg/min Intravenous To OR  . [START ON 12/23/2017] potassium chloride  80 mEq Other To OR  . sodium chloride flush  3 mL Intravenous Q12H  . [START ON 12/23/2017] tranexamic acid  15 mg/kg Intravenous To OR  . [START ON 12/23/2017] tranexamic acid  2 mg/kg Intracatheter To OR   Continuous Infusions: . sodium chloride    .  [START ON 12/23/2017] cefUROXime (ZINACEF)  IV    . [START ON 12/23/2017] dexmedetomidine    . [START ON 12/23/2017] DOPamine    . [START ON 12/23/2017] epinephrine    . [START ON 12/23/2017] heparin 30,000 units/NS 1000 mL solution for CELLSAVER    . heparin 1,850 Units/hr (12/22/17 1149)  . [START ON 12/23/2017] milrinone    . [START ON 12/23/2017] nitroGLYCERIN    . [START ON 12/23/2017] tranexamic acid (CYKLOKAPRON) infusion ([facility])     PRN Meds:.sodium chloride, acetaminophen, morphine injection, ondansetron (ZOFRAN) IV, sodium chloride flush  General appearance: alert and cooperative Neurologic: intact Heart: regular rate and rhythm, S1, S2 normal, no murmur, click, rub or gallop Lungs: clear to auscultation bilaterally Abdomen: soft, non-tender; bowel sounds normal; no masses,  no organomegaly Extremities: extremities normal, atraumatic, no cyanosis or edema and Homans sign is negative, no sign of DVT  Lab Results: CBC: Recent Labs    12/22/17 0208  WBC 15.5*  HGB 14.4  HCT 41.4  PLT 295   BMET:  Recent Labs    12/21/17 0222 12/22/17 0208  NA 136 138  K 3.5 3.6  CL 102 104  CO2 24 25  GLUCOSE 93 98  BUN 15 11  CREATININE 0.99 0.96  CALCIUM 8.5* 8.5*    PT/INR:  Recent Labs    12/22/17 0208  LABPROT 14.0  INR 1.09     Radiology Dg Chest 2 View  Result Date: 12/18/2017 CLINICAL DATA:  Mid chest pain and shortness of breath. EXAM: CHEST - 2 VIEW COMPARISON:  04/25/2012 FINDINGS: Mildly low lung volumes. The lungs appear clear. Cardiac and mediastinal margins appear normal. No pleural effusion. IMPRESSION: 1.  No active cardiopulmonary disease is radiographically apparent. Electronically Signed   By: Van Clines M.D.   On: 12/18/2017 02:55   Mr Cardiac Morphology W Wo Contrast  Result Date: 12/20/2017 CLINICAL DATA:  Ischemic cardiomyopathy, assess for viability EXAM: CARDIAC MRI TECHNIQUE: The patient was scanned on a 1.5 Tesla GE magnet. A  dedicated cardiac coil was used. Functional imaging was done using F[place]a sequences. 2,3, and 4 chamber views were done to assess for RWMA's. Modified Simpson's rule using a short axis stack was used to calculate an ejection fraction on a dedicated work Conservation officer, nature. The patient received 15 cc of Gadavist. After 10 minutes inversion recovery sequences were used to assess for infiltration and scar tissue. FINDINGS: Limited images of the lung fields showed no gross abnormalities. Normal left ventricular size with mild LV hypertrophy. Mid to apical anterior, anteroseptal and inferoseptal hypokinesis. Apical lateral and apical inferior hypokinesis. Severe hypokinesis of the true apex. LV EF 45%. Normal right ventricular size and systolic function, EF 80%. Normal right and left atrial sizes. Trileaflet aortic valve with no stenosis, mild aortic insufficiency. No significant mitral regurgitation noted. Delayed enhancement imaging: Patchy <25% wall thickness subendocardial late gadolinium enhancement (LGE) in the mid to apical anterior, anteroseptal, and inferoseptal walls. Measurements: LVEDV 132 mL LVSV 59 mL LVEF 45% RVEDV 86 mL RVSV 51 mL RVEF 59% IMPRESSION: 1. Normal LV size with mild LV hypertrophy. EF 45% with wall motion abnormalities as noted above. 2.  Normal RV size and systolic function, EF 03%. 3. Delayed enhancement imaging was suggestive of viability of the hypokinetic wall segments. Dalton Mclean Electronically Signed   By: Loralie Champagne M.D.   On: 12/20/2017 17:43    Assessment/Plan: S/P Procedure(s) (LRB): LEFT HEART CATH AND CORONARY ANGIOGRAPHY (N/A) For CABG tomorrow  The goals risks and alternatives of the planned surgical procedure CABG  have been discussed with the patient in detail. The risks of the procedure including death, infection, stroke, myocardial infarction, bleeding, blood transfusion have all been discussed specifically.  I have quoted [name] a 3% of  perioperative mortality and a complication rate as high as 40  %. The patient's questions have been answered.MARKIE FRITH is willing  to proceed with the planned procedure.  [name] MD 12/22/2017 12:34 PM

## 2017-12-22 NOTE — Progress Notes (Signed)
Progress Note  Patient Name: Paul Duncan Date of Encounter: 12/22/2017  Primary Cardiologist: new, Dr. Martinique  Subjective   No recurrent chest pain; for CABG tomorrow  Inpatient Medications    Scheduled Meds: . aspirin EC  81 mg Oral Daily  . atorvastatin  80 mg Oral q1800  . carvedilol  12.5 mg Oral BID WC  . [START ON 12/23/2017] heparin-papaverine-plasmalyte irrigation   Irrigation To OR  . Influenza vac split quadrivalent PF  0.5 mL Intramuscular Tomorrow-1000  . [START ON 12/23/2017] insulin   Intravenous To OR  . losartan  25 mg Oral Daily  . [START ON 12/23/2017] magnesium sulfate  40 mEq Other To OR  . [START ON 12/23/2017] phenylephrine  30-200 mcg/min Intravenous To OR  . [START ON 12/23/2017] potassium chloride  80 mEq Other To OR  . sodium chloride flush  3 mL Intravenous Q12H  . [START ON 12/23/2017] tranexamic acid  15 mg/kg Intravenous To OR  . [START ON 12/23/2017] tranexamic acid  2 mg/kg Intracatheter To OR   Continuous Infusions: . sodium chloride    . [START ON 12/23/2017] cefUROXime (ZINACEF)  IV    . [START ON 12/23/2017] dexmedetomidine    . [START ON 12/23/2017] DOPamine    . [START ON 12/23/2017] epinephrine    . [START ON 12/23/2017] heparin 30,000 units/NS 1000 mL solution for CELLSAVER    . heparin 1,850 Units/hr (12/22/17 1149)  . [START ON 12/23/2017] milrinone    . [START ON 12/23/2017] nitroGLYCERIN    . [START ON 12/23/2017] tranexamic acid (CYKLOKAPRON) infusion ([facility])     PRN Meds: sodium chloride, acetaminophen, morphine injection, ondansetron (ZOFRAN) IV, sodium chloride flush   Vital Signs    Vitals:   12/21/17 2300 12/22/17 0246 12/22/17 0735 12/22/17 1133  BP: 106/69 117/78 114/78 106/77  Pulse: (!) 170   84  Resp: 17 (!) 8 20 18   Temp: 98.3 F (36.8 C) 98.1 F (36.7 C) 98.4 F (36.9 C) 98.3 F (36.8 C)  TempSrc: Oral Oral Oral Oral  SpO2: 94% 97% 96% 98%  Weight:      Height:        Intake/Output Summary  (Last 24 hours) at 12/22/2017 1305 Last data filed at 12/22/2017 1000 Gross per 24 hour  Intake 1133 ml  Output 501 ml  Net 632 ml    I/O since admission: +618  Filed Weights   12/18/17 0221 12/18/17 0519  Weight: 93 kg 94.4 kg    Telemetry    Sinus mid 80s- Personally Reviewed  ECG   ECG (independently read by me): NSR at 79; NS t changes  ECG (independently read by me): ST 103; LAHB  Physical Exam   BP 106/77 (BP Location: Right Arm)   Pulse 84   Temp 98.3 F (36.8 C) (Oral)   Resp 18   Ht 5\' 5"  (1.651 m)   Wt 94.4 kg   SpO2 98%   BMI 34.63 kg/m  General: Alert, oriented, no distress.  Skin: normal turgor, no rashes, warm and dry HEENT: Normocephalic, atraumatic. Pupils equal round and reactive to light; sclera anicteric; extraocular muscles intact; Fundi ** Nose without nasal septal hypertrophy Mouth/Parynx benign; Mallinpatti scale 3 Neck: No JVD, no carotid bruits; normal carotid upstroke Lungs: clear to ausculatation and percussion; no wheezing or rales Chest wall: without tenderness to palpitation Heart: PMI not displaced, RRR, s1 s2 normal, 1/6 systolic murmur, no diastolic murmur, no rubs, gallops, thrills, or heaves Abdomen: soft, nontender; no  hepatosplenomehaly, BS+; abdominal aorta nontender and not dilated by palpation. Back: no CVA tenderness Pulses 2+ Musculoskeletal: full range of motion, normal strength, no joint deformities Extremities: no clubbing cyanosis or edema, Homan's sign negative  Neurologic: grossly nonfocal; Cranial nerves grossly wnl Psychologic: Normal mood and affect   Labs    Chemistry Recent Labs  Lab 12/20/17 0315 12/21/17 0222 12/22/17 0208  NA 135 136 138  K 3.5 3.5 3.6  CL 101 102 104  CO2 22 24 25   GLUCOSE 97 93 98  BUN 18 15 11   CREATININE 0.85 0.99 0.96  CALCIUM 8.3* 8.5* 8.5*  PROT  --   --  6.1*  ALBUMIN  --   --  3.0*  AST  --   --  21  ALT  --   --  20  ALKPHOS  --   --  52  BILITOT  --   --  0.5   GFRNONAA >60 >60 >60  GFRAA >60 >60 >60  ANIONGAP 12 10 9      Hematology Recent Labs  Lab 12/18/17 0421 12/19/17 0249 12/22/17 0208  WBC 21.4* 17.9* 15.5*  RBC 5.51 5.34 4.84  HGB 16.1 15.4 14.4  HCT 45.9 46.2 41.4  MCV 83.3 86.5 85.5  MCH 29.2 28.8 29.8  MCHC 35.1 33.3 34.8  RDW 11.7 12.0 11.9  PLT 337 309 295    Cardiac Enzymes Recent Labs  Lab 12/18/17 0421 12/18/17 0839 12/18/17 1624  TROPONINI 2.40* 5.00* 5.58*    Recent Labs  Lab 12/18/17 0240  TROPIPOC 0.45*     BNPNo results for input(s): BNP, PROBNP in the last 168 hours.   DDimer  Recent Labs  Lab 12/18/17 0421  DDIMER 0.27     Lipid Panel     Component Value Date/Time   CHOL 224 (H) 12/18/2017 0839   TRIG 200 (H) 12/18/2017 0839   HDL 37 (L) 12/18/2017 0839   CHOLHDL 6.1 12/18/2017 0839   VLDL 40 12/18/2017 0839   LDLCALC 147 (H) 12/18/2017 0839    Radiology    No results found.  Cardiac Studies   Echo 12/18/17: Study Conclusions  - Left ventricle: The cavity size was normal. There was mild concentric hypertrophy. Systolic function was mildly to moderately reduced. The estimated ejection fraction was in the range of 40% to 45%. There is akinesis of the apicalanterior and apical myocardium. There is akinesis of the midanteroseptal myocardium. - Pulmonary arteries: Systolic pressure could not be accurately estimated.   LEFT HEART CATH AND CORONARY ANGIOGRAPHY  Conclusion     There is moderate to severe left ventricular systolic dysfunction.  LV end diastolic pressure is moderately elevated.  The left ventricular ejection fraction is 25-35% by visual estimate.  Mid RCA lesion is 60% stenosed.  Ost 2nd Mrg lesion is 100% stenosed.  Ost 1st Mrg lesion is 30% stenosed.  Mid LAD lesion is 100% stenosed.  Prox LAD lesion is 99% stenosed.  1. Significant three-vessel coronary artery disease as outlined above. 2. Severely reduced LV systolic function  with an EF of 25 to 35%. 3. Moderately elevated left ventricular end-diastolic pressure.  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 If the mid to distal LAD myocardium is viable, I think his best option is CABG. If in the other hand there is no viable myocardium in that area, PCI of proximal LAD plus fractional flow reserve guided revascularization  of the RCA can be considered.     MR VIABILITY  FINDINGS: Limited images of the lung fields showed no gross abnormalities.  Normal left ventricular size with mild LV hypertrophy. Mid to apical anterior, anteroseptal and inferoseptal hypokinesis. Apical lateral and apical inferior hypokinesis. Severe hypokinesis of the true apex. LV EF 45%. Normal right ventricular size and systolic function, EF 30%. Normal right and left atrial sizes. Trileaflet aortic valve with no stenosis, mild aortic insufficiency. No significant mitral regurgitation noted.  Delayed enhancement imaging: Patchy <25% wall thickness subendocardial late gadolinium enhancement (LGE) in the mid to apical anterior, anteroseptal, and inferoseptal walls.  Measurements:  LVEDV 132 mL  LVSV 59 mL  LVEF 45%  RVEDV 86 mL  RVSV 51 mL  RVEF 59%  IMPRESSION: 1. Normal LV size with mild LV hypertrophy. EF 45% with wall motion abnormalities as noted above.  2.  Normal RV size and systolic function, EF 86%.  3. Delayed enhancement imaging was suggestive of viability of the hypokinetic wall segments.   Patient Profile     42 y.o. male with history of HTN, HLD, family history of premature CAD presents with NSTEMI  Assessment & Plan    1. NSTEMI:  Troponoin 5.58.  Cath findings as above.  Viable myocardium. For CABG tomorrow with Dr. Servando Snare.  Tolerating increased coregNo recurrent anginal symptoms.   2. HLD: LDL 147; now on atorvastatin 80 mg.   3.  HTN: BP controlled  4. Leukocytosis: WBC 15.5, improved from 21.4  4. Obesity: BMI 34; will need weight loss.  5. FH for premature CAD; father with CABG in his 85s as well.  Signed, Troy Sine, MD, [facility] 12/22/2017, 1:05 PM

## 2017-12-23 ENCOUNTER — Encounter (HOSPITAL_COMMUNITY): Payer: Self-pay | Admitting: Certified Registered"

## 2017-12-23 ENCOUNTER — Inpatient Hospital Stay (HOSPITAL_COMMUNITY): Payer: Managed Care, Other (non HMO) | Admitting: Certified Registered"

## 2017-12-23 ENCOUNTER — Inpatient Hospital Stay (HOSPITAL_COMMUNITY)
Admission: EM | Disposition: A | Discharge status: Home / Self Care | Payer: Self-pay | Source: Home / Self Care | Attending: Cardiothoracic Surgery

## 2017-12-23 ENCOUNTER — Inpatient Hospital Stay (HOSPITAL_COMMUNITY): Payer: Managed Care, Other (non HMO)

## 2017-12-23 ENCOUNTER — Inpatient Hospital Stay (HOSPITAL_COMMUNITY)
Admit: 2017-12-23 | Discharge: 2017-12-23 | Disposition: A | Discharge status: Home / Self Care | Payer: Managed Care, Other (non HMO) | Attending: Cardiothoracic Surgery | Admitting: Cardiothoracic Surgery

## 2017-12-23 DIAGNOSIS — Z951 Presence of aortocoronary bypass graft: Secondary | ICD-10-CM

## 2017-12-23 DIAGNOSIS — S15391A Other specified injury of right internal jugular vein, initial encounter: Secondary | ICD-10-CM

## 2017-12-23 HISTORY — PX: CORONARY ARTERY BYPASS GRAFT: SHX141

## 2017-12-23 HISTORY — PX: TEE WITHOUT CARDIOVERSION: SHX5443

## 2017-12-23 LAB — POCT I-STAT, CHEM 8
BUN: 14 mg/dL (ref 6–20)
BUN: 11 mg/dL (ref 6–20)
BUN: 10 mg/dL (ref 6–20)
BUN: 11 mg/dL (ref 6–20)
BUN: 10 mg/dL (ref 6–20)
BUN: 9 mg/dL (ref 6–20)
BUN: 9 mg/dL (ref 6–20)
BUN: 11 mg/dL (ref 6–20)
CALCIUM ION: 0.93 mmol/L — AB (ref 1.15–1.40)
CALCIUM ION: 1.1 mmol/L — AB (ref 1.15–1.40)
CALCIUM ION: 1.16 mmol/L (ref 1.15–1.40)
CHLORIDE: 103 mmol/L (ref 98–111)
CHLORIDE: 103 mmol/L (ref 98–111)
CHLORIDE: 103 mmol/L (ref 98–111)
CREATININE: 0.5 mg/dL — AB (ref 0.61–1.24)
CREATININE: 0.6 mg/dL — AB (ref 0.61–1.24)
CREATININE: 0.5 mg/dL — AB (ref 0.61–1.24)
CREATININE: 0.7 mg/dL (ref 0.61–1.24)
Calcium, Ion: 1.22 mmol/L (ref 1.15–1.40)
Calcium, Ion: 1.16 mmol/L (ref 1.15–1.40)
Calcium, Ion: 0.98 mmol/L — ABNORMAL LOW (ref 1.15–1.40)
Calcium, Ion: 1.15 mmol/L (ref 1.15–1.40)
Calcium, Ion: 1.15 mmol/L (ref 1.15–1.40)
Chloride: 101 mmol/L (ref 98–111)
Chloride: 104 mmol/L (ref 98–111)
Chloride: 102 mmol/L (ref 98–111)
Chloride: 104 mmol/L (ref 98–111)
Chloride: 105 mmol/L (ref 98–111)
Creatinine, Ser: 0.8 mg/dL (ref 0.61–1.24)
Creatinine, Ser: 0.7 mg/dL (ref 0.61–1.24)
Creatinine, Ser: 0.5 mg/dL — ABNORMAL LOW (ref 0.61–1.24)
Creatinine, Ser: 0.6 mg/dL — ABNORMAL LOW (ref 0.61–1.24)
GLUCOSE: 145 mg/dL — AB (ref 70–99)
GLUCOSE: 128 mg/dL — AB (ref 70–99)
GLUCOSE: 132 mg/dL — AB (ref 70–99)
Glucose, Bld: 111 mg/dL — ABNORMAL HIGH (ref 70–99)
Glucose, Bld: 116 mg/dL — ABNORMAL HIGH (ref 70–99)
Glucose, Bld: 107 mg/dL — ABNORMAL HIGH (ref 70–99)
Glucose, Bld: 117 mg/dL — ABNORMAL HIGH (ref 70–99)
Glucose, Bld: 152 mg/dL — ABNORMAL HIGH (ref 70–99)
HCT: 20 % — ABNORMAL LOW (ref 39.0–52.0)
HCT: 22 % — ABNORMAL LOW (ref 39.0–52.0)
HCT: 27 % — ABNORMAL LOW (ref 39.0–52.0)
HEMATOCRIT: 39 % (ref 39.0–52.0)
HEMATOCRIT: 34 % — AB (ref 39.0–52.0)
HEMATOCRIT: 25 % — AB (ref 39.0–52.0)
HEMATOCRIT: 32 % — AB (ref 39.0–52.0)
HEMATOCRIT: 22 % — AB (ref 39.0–52.0)
HEMOGLOBIN: 6.8 g/dL — AB (ref 13.0–17.0)
HEMOGLOBIN: 9.2 g/dL — AB (ref 13.0–17.0)
HEMOGLOBIN: 7.5 g/dL — AB (ref 13.0–17.0)
Hemoglobin: 13.3 g/dL (ref 13.0–17.0)
Hemoglobin: 11.6 g/dL — ABNORMAL LOW (ref 13.0–17.0)
Hemoglobin: 10.9 g/dL — ABNORMAL LOW (ref 13.0–17.0)
Hemoglobin: 8.5 g/dL — ABNORMAL LOW (ref 13.0–17.0)
Hemoglobin: 7.5 g/dL — ABNORMAL LOW (ref 13.0–17.0)
POTASSIUM: 4.2 mmol/L (ref 3.5–5.1)
POTASSIUM: 4.2 mmol/L (ref 3.5–5.1)
POTASSIUM: 4.5 mmol/L (ref 3.5–5.1)
POTASSIUM: 4.8 mmol/L (ref 3.5–5.1)
POTASSIUM: 3.9 mmol/L (ref 3.5–5.1)
POTASSIUM: 4.5 mmol/L (ref 3.5–5.1)
Potassium: 4.2 mmol/L (ref 3.5–5.1)
Potassium: 4.1 mmol/L (ref 3.5–5.1)
SODIUM: 138 mmol/L (ref 135–145)
SODIUM: 138 mmol/L (ref 135–145)
Sodium: 137 mmol/L (ref 135–145)
Sodium: 138 mmol/L (ref 135–145)
Sodium: 135 mmol/L (ref 135–145)
Sodium: 138 mmol/L (ref 135–145)
Sodium: 139 mmol/L (ref 135–145)
Sodium: 139 mmol/L (ref 135–145)
TCO2: 23 mmol/L (ref 22–32)
TCO2: 23 mmol/L (ref 22–32)
TCO2: 22 mmol/L (ref 22–32)
TCO2: 25 mmol/L (ref 22–32)
TCO2: 22 mmol/L (ref 22–32)
TCO2: 20 mmol/L — AB (ref 22–32)
TCO2: 22 mmol/L (ref 22–32)
TCO2: 22 mmol/L (ref 22–32)

## 2017-12-23 LAB — POCT I-STAT 3, ART BLOOD GAS (G3+)
ACID-BASE DEFICIT: 3 mmol/L — AB (ref 0.0–2.0)
ACID-BASE DEFICIT: 5 mmol/L — AB (ref 0.0–2.0)
Acid-base deficit: 5 mmol/L — ABNORMAL HIGH (ref 0.0–2.0)
Acid-base deficit: 4 mmol/L — ABNORMAL HIGH (ref 0.0–2.0)
BICARBONATE: 25.7 mmol/L (ref 20.0–28.0)
Bicarbonate: 19.3 mmol/L — ABNORMAL LOW (ref 20.0–28.0)
Bicarbonate: 22.2 mmol/L (ref 20.0–28.0)
Bicarbonate: 20.9 mmol/L (ref 20.0–28.0)
Bicarbonate: 20.8 mmol/L (ref 20.0–28.0)
O2 SAT: 94 %
O2 Saturation: 100 %
O2 Saturation: 99 %
O2 Saturation: 97 %
O2 Saturation: 89 %
PCO2 ART: 37.1 mmHg (ref 32.0–48.0)
PH ART: 7.418 (ref 7.350–7.450)
PH ART: 7.326 — AB (ref 7.350–7.450)
PO2 ART: 313 mmHg — AB (ref 83.0–108.0)
PO2 ART: 85 mmHg (ref 83.0–108.0)
Patient temperature: 35.8
Patient temperature: 37.3
TCO2: 27 mmol/L (ref 22–32)
TCO2: 20 mmol/L — AB (ref 22–32)
TCO2: 23 mmol/L (ref 22–32)
TCO2: 22 mmol/L (ref 22–32)
TCO2: 22 mmol/L (ref 22–32)
pCO2 arterial: 44.2 mmHg (ref 32.0–48.0)
pCO2 arterial: 29.9 mmHg — ABNORMAL LOW (ref 32.0–48.0)
pCO2 arterial: 37.1 mmHg (ref 32.0–48.0)
pCO2 arterial: 39.9 mmHg (ref 32.0–48.0)
pH, Arterial: 7.372 (ref 7.350–7.450)
pH, Arterial: 7.379 (ref 7.350–7.450)
pH, Arterial: 7.359 (ref 7.350–7.450)
pO2, Arterial: 112 mmHg — ABNORMAL HIGH (ref 83.0–108.0)
pO2, Arterial: 73 mmHg — ABNORMAL LOW (ref 83.0–108.0)
pO2, Arterial: 62 mmHg — ABNORMAL LOW (ref 83.0–108.0)

## 2017-12-23 LAB — CBC
HCT: 41.6 % (ref 39.0–52.0)
HCT: 31.7 % — ABNORMAL LOW (ref 39.0–52.0)
HCT: 25.2 % — ABNORMAL LOW (ref 39.0–52.0)
Hemoglobin: 13.9 g/dL (ref 13.0–17.0)
Hemoglobin: 10.9 g/dL — ABNORMAL LOW (ref 13.0–17.0)
Hemoglobin: 8.5 g/dL — ABNORMAL LOW (ref 13.0–17.0)
MCH: 29 pg (ref 26.0–34.0)
MCH: 29.5 pg (ref 26.0–34.0)
MCH: 29.2 pg (ref 26.0–34.0)
MCHC: 33.4 g/dL (ref 30.0–36.0)
MCHC: 34.4 g/dL (ref 30.0–36.0)
MCHC: 33.7 g/dL (ref 30.0–36.0)
MCV: 86.7 fL (ref 80.0–100.0)
MCV: 85.9 fL (ref 80.0–100.0)
MCV: 86.6 fL (ref 80.0–100.0)
PLATELETS: 179 10*3/uL (ref 150–400)
Platelets: 321 10*3/uL (ref 150–400)
Platelets: 167 10*3/uL (ref 150–400)
RBC: 4.8 MIL/uL (ref 4.22–5.81)
RBC: 3.69 MIL/uL — ABNORMAL LOW (ref 4.22–5.81)
RBC: 2.91 MIL/uL — ABNORMAL LOW (ref 4.22–5.81)
RDW: 12 % (ref 11.5–15.5)
RDW: 12.3 % (ref 11.5–15.5)
RDW: 12.3 % (ref 11.5–15.5)
WBC: 15.3 10*3/uL — ABNORMAL HIGH (ref 4.0–10.5)
WBC: 25 10*3/uL — AB (ref 4.0–10.5)
WBC: 15.4 10*3/uL — ABNORMAL HIGH (ref 4.0–10.5)
nRBC: 0 % (ref 0.0–0.2)
nRBC: 0 % (ref 0.0–0.2)
nRBC: 0 % (ref 0.0–0.2)

## 2017-12-23 LAB — MAGNESIUM: Magnesium: 2.7 mg/dL — ABNORMAL HIGH (ref 1.7–2.4)

## 2017-12-23 LAB — HEMOGLOBIN AND HEMATOCRIT, BLOOD
HCT: 22.3 % — ABNORMAL LOW (ref 39.0–52.0)
Hemoglobin: 7.5 g/dL — ABNORMAL LOW (ref 13.0–17.0)

## 2017-12-23 LAB — CREATININE, SERUM
Creatinine, Ser: 0.87 mg/dL (ref 0.61–1.24)
GFR calc Af Amer: >60 mL/min (ref >60)
GFR calc non Af Amer: >60 mL/min (ref >60)

## 2017-12-23 LAB — BASIC METABOLIC PANEL
ANION GAP: 8 (ref 5–15)
BUN: 14 mg/dL (ref 6–20)
CO2: 26 mmol/L (ref 22–32)
Calcium: 8.6 mg/dL — ABNORMAL LOW (ref 8.9–10.3)
Chloride: 104 mmol/L (ref 98–111)
Creatinine, Ser: 0.97 mg/dL (ref 0.61–1.24)
GLUCOSE: 101 mg/dL — AB (ref 70–99)
Potassium: 3.5 mmol/L (ref 3.5–5.1)
Sodium: 138 mmol/L (ref 135–145)

## 2017-12-23 LAB — FIBRINOGEN: Fibrinogen: 322 mg/dL (ref 210–475)

## 2017-12-23 LAB — PROTIME-INR
INR: 1.49
Prothrombin Time: 17.9 seconds — ABNORMAL HIGH (ref 11.4–15.2)

## 2017-12-23 LAB — POCT I-STAT 4, (NA,K, GLUC, HGB,HCT)
Glucose, Bld: 123 mg/dL — ABNORMAL HIGH (ref 70–99)
HCT: 29 % — ABNORMAL LOW (ref 39.0–52.0)
HEMOGLOBIN: 9.9 g/dL — AB (ref 13.0–17.0)
Potassium: 3.9 mmol/L (ref 3.5–5.1)
SODIUM: 140 mmol/L (ref 135–145)

## 2017-12-23 LAB — GLUCOSE, CAPILLARY
GLUCOSE-CAPILLARY: 109 mg/dL — AB (ref 70–99)
Glucose-Capillary: 128 mg/dL — ABNORMAL HIGH (ref 70–99)
Glucose-Capillary: 120 mg/dL — ABNORMAL HIGH (ref 70–99)
Glucose-Capillary: 106 mg/dL — ABNORMAL HIGH (ref 70–99)
Glucose-Capillary: 92 mg/dL (ref 70–99)

## 2017-12-23 LAB — PREPARE RBC (CROSSMATCH)

## 2017-12-23 LAB — HEPARIN LEVEL (UNFRACTIONATED): Heparin Unfractionated: 0.57 IU/mL (ref 0.30–0.70)

## 2017-12-23 SURGERY — CORONARY ARTERY BYPASS GRAFTING (CABG)
Anesthesia: General | Site: Chest

## 2017-12-23 MED ORDER — METOPROLOL TARTRATE 12.5 MG HALF TABLET
12.5000 mg | ORAL_TABLET | Freq: Two times a day (BID) | ORAL | Status: DC
  Administered 2017-12-24 – 2017-12-25 (×3): 12.5 mg via ORAL
  Filled 2017-12-23 (×3): qty 1

## 2017-12-23 MED ORDER — MIDAZOLAM HCL 2 MG/2ML IJ SOLN
INTRAMUSCULAR | Status: AC
  Filled 2017-12-23: qty 2

## 2017-12-23 MED ORDER — ASPIRIN EC 325 MG PO TBEC
325.0000 mg | DELAYED_RELEASE_TABLET | Freq: Every day | ORAL | Status: DC
  Administered 2017-12-24 – 2018-01-01 (×9): 325 mg via ORAL
  Filled 2017-12-23 (×9): qty 1

## 2017-12-23 MED ORDER — INSULIN REGULAR BOLUS VIA INFUSION
0.0000 [IU] | Freq: Three times a day (TID) | INTRAVENOUS | Status: DC
  Filled 2017-12-23: qty 10

## 2017-12-23 MED ORDER — MAGNESIUM SULFATE 4 GM/100ML IV SOLN
4.0000 g | Freq: Once | INTRAVENOUS | Status: AC
  Administered 2017-12-23: 4 g via INTRAVENOUS
  Filled 2017-12-23: qty 100

## 2017-12-23 MED ORDER — METOPROLOL TARTRATE 5 MG/5ML IV SOLN: 2.5000 mg | INTRAVENOUS | Status: DC | PRN

## 2017-12-23 MED ORDER — EPHEDRINE SULFATE 50 MG/ML IJ SOLN
INTRAMUSCULAR | Status: DC | PRN
  Administered 2017-12-23 (×3): 5 mg via INTRAVENOUS

## 2017-12-23 MED ORDER — SODIUM CHLORIDE 0.9 % IV SOLN
INTRAVENOUS | Status: DC
  Administered 2017-12-23: 17:00:00 via INTRAVENOUS

## 2017-12-23 MED ORDER — SODIUM BICARBONATE 8.4 % IV SOLN
INTRAVENOUS | Status: DC | PRN
  Administered 2017-12-23: 25 meq via INTRAVENOUS

## 2017-12-23 MED ORDER — INSULIN REGULAR(HUMAN) IN NACL 100-0.9 UT/100ML-% IV SOLN: INTRAVENOUS | Status: DC

## 2017-12-23 MED ORDER — NITROGLYCERIN IN D5W 200-5 MCG/ML-% IV SOLN: 0.0000 ug/min | INTRAVENOUS | Status: DC

## 2017-12-23 MED ORDER — PHENYLEPHRINE HCL-NACL 20-0.9 MG/250ML-% IV SOLN
0.0000 ug/min | INTRAVENOUS | Status: DC
  Administered 2017-12-23: 65 ug/min via INTRAVENOUS
  Filled 2017-12-23: qty 250

## 2017-12-23 MED ORDER — FENTANYL CITRATE (PF) 250 MCG/5ML IJ SOLN
INTRAMUSCULAR | Status: DC | PRN
  Administered 2017-12-23: 50 ug via INTRAVENOUS
  Administered 2017-12-23: 150 ug via INTRAVENOUS
  Administered 2017-12-23: 100 ug via INTRAVENOUS
  Administered 2017-12-23 (×4): 50 ug via INTRAVENOUS
  Administered 2017-12-23: 150 ug via INTRAVENOUS
  Administered 2017-12-23: 50 ug via INTRAVENOUS
  Administered 2017-12-23: 100 ug via INTRAVENOUS
  Administered 2017-12-23: 50 ug via INTRAVENOUS
  Administered 2017-12-23: 100 ug via INTRAVENOUS
  Administered 2017-12-23 (×3): 50 ug via INTRAVENOUS

## 2017-12-23 MED ORDER — SODIUM CHLORIDE 0.9% FLUSH
3.0000 mL | Freq: Two times a day (BID) | INTRAVENOUS | Status: DC
  Administered 2017-12-24 – 2017-12-27 (×7): 3 mL via INTRAVENOUS

## 2017-12-23 MED ORDER — ACETAMINOPHEN 160 MG/5ML PO SOLN: 1000.0000 mg | Freq: Four times a day (QID) | ORAL | Status: DC

## 2017-12-23 MED ORDER — LACTATED RINGERS IV SOLN
500.0000 mL | Freq: Once | INTRAVENOUS | Status: AC | PRN
  Administered 2017-12-23: 500 mL via INTRAVENOUS

## 2017-12-23 MED ORDER — SODIUM CHLORIDE 0.9 % IV SOLN: 250.0000 mL | INTRAVENOUS | Status: DC

## 2017-12-23 MED ORDER — MIDAZOLAM HCL 10 MG/2ML IJ SOLN
INTRAMUSCULAR | Status: AC
  Filled 2017-12-23: qty 2

## 2017-12-23 MED ORDER — LACTATED RINGERS IV SOLN
INTRAVENOUS | Status: DC | PRN
  Administered 2017-12-23 (×4): via INTRAVENOUS

## 2017-12-23 MED ORDER — PANTOPRAZOLE SODIUM 40 MG PO TBEC
40.0000 mg | DELAYED_RELEASE_TABLET | Freq: Every day | ORAL | Status: DC
  Administered 2017-12-25 – 2018-01-01 (×8): 40 mg via ORAL
  Filled 2017-12-23 (×8): qty 1

## 2017-12-23 MED ORDER — SODIUM CHLORIDE 0.9 % IV SOLN
1.5000 g | Freq: Two times a day (BID) | INTRAVENOUS | Status: AC
  Administered 2017-12-23 – 2017-12-25 (×4): 1.5 g via INTRAVENOUS
  Filled 2017-12-23 (×4): qty 1.5

## 2017-12-23 MED ORDER — SODIUM CHLORIDE 0.9% FLUSH: 10.0000 mL | INTRAVENOUS | Status: DC | PRN

## 2017-12-23 MED ORDER — ALBUMIN HUMAN 5 % IV SOLN
INTRAVENOUS | Status: DC | PRN
  Administered 2017-12-23 (×2): via INTRAVENOUS

## 2017-12-23 MED ORDER — SODIUM CHLORIDE 0.9 % IV SOLN
INTRAVENOUS | Status: AC
  Filled 2017-12-23: qty 1.2

## 2017-12-23 MED ORDER — PROTAMINE SULFATE 10 MG/ML IV SOLN
INTRAVENOUS | Status: DC | PRN
  Administered 2017-12-23: 50 mg via INTRAVENOUS
  Administered 2017-12-23: 250 mg via INTRAVENOUS

## 2017-12-23 MED ORDER — SODIUM CHLORIDE 0.9 % IJ SOLN
OROMUCOSAL | Status: DC | PRN
  Administered 2017-12-23 (×2): 4 mL via TOPICAL

## 2017-12-23 MED ORDER — ACETAMINOPHEN 160 MG/5ML PO SOLN: 650.0000 mg | Freq: Once | ORAL | Status: AC

## 2017-12-23 MED ORDER — HEPARIN SODIUM (PORCINE) 1000 UNIT/ML IJ SOLN
INTRAMUSCULAR | Status: AC
  Filled 2017-12-23: qty 1

## 2017-12-23 MED ORDER — ROCURONIUM BROMIDE 50 MG/5ML IV SOSY
PREFILLED_SYRINGE | INTRAVENOUS | Status: AC
  Filled 2017-12-23: qty 10

## 2017-12-23 MED ORDER — POTASSIUM CHLORIDE 10 MEQ/50ML IV SOLN
10.0000 meq | INTRAVENOUS | Status: AC
  Administered 2017-12-23 (×3): 10 meq via INTRAVENOUS

## 2017-12-23 MED ORDER — ORAL CARE MOUTH RINSE
15.0000 mL | Freq: Two times a day (BID) | OROMUCOSAL | Status: DC
  Administered 2017-12-23 – 2017-12-25 (×4): 15 mL via OROMUCOSAL

## 2017-12-23 MED ORDER — DOCUSATE SODIUM 100 MG PO CAPS
200.0000 mg | ORAL_CAPSULE | Freq: Every day | ORAL | Status: DC
  Administered 2017-12-24 – 2017-12-25 (×2): 200 mg via ORAL
  Filled 2017-12-23 (×2): qty 2

## 2017-12-23 MED ORDER — ONDANSETRON HCL 4 MG/2ML IJ SOLN
4.0000 mg | Freq: Four times a day (QID) | INTRAMUSCULAR | Status: DC | PRN
  Filled 2017-12-23: qty 2

## 2017-12-23 MED ORDER — PROPOFOL 10 MG/ML IV BOLUS
INTRAVENOUS | Status: DC | PRN
  Administered 2017-12-23: 30 mg via INTRAVENOUS
  Administered 2017-12-23: 100 mg via INTRAVENOUS

## 2017-12-23 MED ORDER — ASPIRIN 81 MG PO CHEW
324.0000 mg | CHEWABLE_TABLET | Freq: Every day | ORAL | Status: DC
  Filled 2017-12-23: qty 4

## 2017-12-23 MED ORDER — BISACODYL 5 MG PO TBEC
10.0000 mg | DELAYED_RELEASE_TABLET | Freq: Every day | ORAL | Status: DC
  Administered 2017-12-24 – 2017-12-25 (×2): 10 mg via ORAL
  Filled 2017-12-23 (×2): qty 2

## 2017-12-23 MED ORDER — SODIUM CHLORIDE 0.9% FLUSH: 3.0000 mL | INTRAVENOUS | Status: DC | PRN

## 2017-12-23 MED ORDER — SODIUM BICARBONATE 8.4 % IV SOLN
INTRAVENOUS | Status: AC
  Filled 2017-12-23: qty 50

## 2017-12-23 MED ORDER — HEMOSTATIC AGENTS (NO CHARGE) OPTIME
TOPICAL | Status: DC | PRN
  Administered 2017-12-23: 1 via TOPICAL

## 2017-12-23 MED ORDER — PLASMA-LYTE 148 IV SOLN
INTRAVENOUS | Status: AC
  Administered 2017-12-23: 500 mL
  Filled 2017-12-23: qty 2.5

## 2017-12-23 MED ORDER — EPHEDRINE 5 MG/ML INJ
INTRAVENOUS | Status: AC
  Filled 2017-12-23: qty 10

## 2017-12-23 MED ORDER — BISACODYL 10 MG RE SUPP: 10.0000 mg | Freq: Every day | RECTAL | Status: DC

## 2017-12-23 MED ORDER — MORPHINE SULFATE (PF) 2 MG/ML IV SOLN
2.0000 mg | INTRAVENOUS | Status: DC | PRN
  Administered 2017-12-24 – 2017-12-25 (×3): 2 mg via INTRAVENOUS
  Filled 2017-12-23 (×3): qty 1

## 2017-12-23 MED ORDER — DOPAMINE-DEXTROSE 3.2-5 MG/ML-% IV SOLN: 3.0000 ug/kg/min | INTRAVENOUS | Status: DC

## 2017-12-23 MED ORDER — TRANEXAMIC ACID 1000 MG/10ML IV SOLN
1.5000 mg/kg/h | INTRAVENOUS | Status: DC
  Filled 2017-12-23: qty 25

## 2017-12-23 MED ORDER — CHLORHEXIDINE GLUCONATE 0.12 % MT SOLN
15.0000 mL | OROMUCOSAL | Status: AC
  Filled 2017-12-23: qty 15

## 2017-12-23 MED ORDER — DEXMEDETOMIDINE HCL IN NACL 400 MCG/100ML IV SOLN
0.1000 ug/kg/h | INTRAVENOUS | Status: DC
  Filled 2017-12-23: qty 100

## 2017-12-23 MED ORDER — ROCURONIUM BROMIDE 50 MG/5ML IV SOSY
PREFILLED_SYRINGE | INTRAVENOUS | Status: AC
  Filled 2017-12-23: qty 5

## 2017-12-23 MED ORDER — ROCURONIUM BROMIDE 10 MG/ML (PF) SYRINGE
PREFILLED_SYRINGE | INTRAVENOUS | Status: DC | PRN
  Administered 2017-12-23: 20 mg via INTRAVENOUS
  Administered 2017-12-23: 50 mg via INTRAVENOUS
  Administered 2017-12-23: 20 mg via INTRAVENOUS
  Administered 2017-12-23: 30 mg via INTRAVENOUS
  Administered 2017-12-23: 50 mg via INTRAVENOUS
  Administered 2017-12-23: 30 mg via INTRAVENOUS
  Administered 2017-12-23: 50 mg via INTRAVENOUS
  Administered 2017-12-23: 20 mg via INTRAVENOUS
  Administered 2017-12-23: 10 mg via INTRAVENOUS

## 2017-12-23 MED ORDER — MIDAZOLAM HCL 2 MG/2ML IJ SOLN: 2.0000 mg | INTRAMUSCULAR | Status: DC | PRN

## 2017-12-23 MED ORDER — SODIUM CHLORIDE 0.9% FLUSH
10.0000 mL | Freq: Two times a day (BID) | INTRAVENOUS | Status: DC
  Administered 2017-12-24 – 2017-12-26 (×5): 10 mL
  Administered 2017-12-26: 20 mL

## 2017-12-23 MED ORDER — MORPHINE SULFATE (PF) 2 MG/ML IV SOLN: 1.0000 mg | INTRAVENOUS | Status: AC | PRN

## 2017-12-23 MED ORDER — FENTANYL CITRATE (PF) 100 MCG/2ML IJ SOLN: 25.0000 ug | INTRAMUSCULAR | Status: DC | PRN

## 2017-12-23 MED ORDER — PROTAMINE SULFATE 10 MG/ML IV SOLN
INTRAVENOUS | Status: AC
  Filled 2017-12-23: qty 25

## 2017-12-23 MED ORDER — VANCOMYCIN HCL IN DEXTROSE 1-5 GM/200ML-% IV SOLN
1000.0000 mg | Freq: Once | INTRAVENOUS | Status: AC
  Administered 2017-12-23: 1000 mg via INTRAVENOUS
  Filled 2017-12-23: qty 200

## 2017-12-23 MED ORDER — ACETAMINOPHEN 650 MG RE SUPP
650.0000 mg | Freq: Once | RECTAL | Status: AC
  Administered 2017-12-23: 650 mg via RECTAL

## 2017-12-23 MED ORDER — LACTATED RINGERS IV SOLN: INTRAVENOUS | Status: DC

## 2017-12-23 MED ORDER — SODIUM CHLORIDE 0.45 % IV SOLN
INTRAVENOUS | Status: DC | PRN
  Administered 2017-12-23: 17:00:00 via INTRAVENOUS

## 2017-12-23 MED ORDER — 0.9 % SODIUM CHLORIDE (POUR BTL) OPTIME
TOPICAL | Status: DC | PRN
  Administered 2017-12-23: 5000 mL
  Administered 2017-12-23: 1000 mL

## 2017-12-23 MED ORDER — HEPARIN SODIUM (PORCINE) 1000 UNIT/ML IJ SOLN
INTRAMUSCULAR | Status: DC | PRN
  Administered 2017-12-23: 25000 [IU] via INTRAVENOUS
  Administered 2017-12-23: 3000 [IU] via INTRAVENOUS
  Administered 2017-12-23: 7000 [IU] via INTRAVENOUS

## 2017-12-23 MED ORDER — HEMOSTATIC AGENTS (NO CHARGE) OPTIME
TOPICAL | Status: DC | PRN
  Administered 2017-12-23 (×2): 1 via TOPICAL

## 2017-12-23 MED ORDER — SUCCINYLCHOLINE CHLORIDE 20 MG/ML IJ SOLN
INTRAMUSCULAR | Status: DC | PRN
  Administered 2017-12-23: 120 mg via INTRAVENOUS

## 2017-12-23 MED ORDER — OXYCODONE HCL 5 MG PO TABS
5.0000 mg | ORAL_TABLET | ORAL | Status: DC | PRN
  Administered 2017-12-24 (×3): 5 mg via ORAL
  Administered 2017-12-25: 10 mg via ORAL
  Administered 2017-12-25: 5 mg via ORAL
  Administered 2017-12-25 – 2017-12-28 (×3): 10 mg via ORAL
  Filled 2017-12-23: qty 2
  Filled 2017-12-23 (×2): qty 1
  Filled 2017-12-23: qty 2
  Filled 2017-12-23: qty 1
  Filled 2017-12-23: qty 2
  Filled 2017-12-23: qty 1
  Filled 2017-12-23: qty 2

## 2017-12-23 MED ORDER — TRAMADOL HCL 50 MG PO TABS
50.0000 mg | ORAL_TABLET | ORAL | Status: DC | PRN
  Administered 2017-12-24: 50 mg via ORAL
  Administered 2017-12-29 – 2017-12-31 (×3): 100 mg via ORAL
  Filled 2017-12-23 (×2): qty 2
  Filled 2017-12-23: qty 1
  Filled 2017-12-23: qty 2

## 2017-12-23 MED ORDER — SUCCINYLCHOLINE CHLORIDE 200 MG/10ML IV SOSY
PREFILLED_SYRINGE | INTRAVENOUS | Status: AC
  Filled 2017-12-23: qty 10

## 2017-12-23 MED ORDER — ACETAMINOPHEN 500 MG PO TABS
1000.0000 mg | ORAL_TABLET | Freq: Four times a day (QID) | ORAL | Status: DC
  Administered 2017-12-23 – 2017-12-27 (×12): 1000 mg via ORAL
  Filled 2017-12-23 (×14): qty 2

## 2017-12-23 MED ORDER — ATORVASTATIN CALCIUM 80 MG PO TABS
80.0000 mg | ORAL_TABLET | Freq: Every day | ORAL | Status: DC
  Administered 2017-12-24 – 2017-12-31 (×7): 80 mg via ORAL
  Filled 2017-12-23 (×8): qty 1

## 2017-12-23 MED ORDER — FAMOTIDINE IN NACL 20-0.9 MG/50ML-% IV SOLN
20.0000 mg | Freq: Two times a day (BID) | INTRAVENOUS | Status: DC
  Administered 2017-12-23: 20 mg via INTRAVENOUS

## 2017-12-23 MED ORDER — PHENYLEPHRINE 40 MCG/ML (10ML) SYRINGE FOR IV PUSH (FOR BLOOD PRESSURE SUPPORT)
PREFILLED_SYRINGE | INTRAVENOUS | Status: AC
  Filled 2017-12-23: qty 10

## 2017-12-23 MED ORDER — SODIUM CHLORIDE 0.9% IV SOLUTION: Freq: Once | INTRAVENOUS | Status: DC

## 2017-12-23 MED ORDER — SODIUM CHLORIDE 0.9 % IV SOLN
INTRAVENOUS | Status: DC | PRN
  Administered 2017-12-23 (×2): 750 mg via INTRAVENOUS

## 2017-12-23 MED ORDER — CHLORHEXIDINE GLUCONATE CLOTH 2 % EX PADS
6.0000 | MEDICATED_PAD | Freq: Every day | CUTANEOUS | Status: DC
  Administered 2017-12-23 – 2017-12-25 (×3): 6 via TOPICAL

## 2017-12-23 MED ORDER — SODIUM CHLORIDE 0.9 % IV SOLN
INTRAVENOUS | Status: DC | PRN
  Administered 2017-12-23: 500 mL

## 2017-12-23 MED ORDER — METOPROLOL TARTRATE 25 MG/10 ML ORAL SUSPENSION: 12.5000 mg | Freq: Two times a day (BID) | ORAL | Status: DC

## 2017-12-23 MED ORDER — MILRINONE LACTATE IN DEXTROSE 20-5 MG/100ML-% IV SOLN
0.3000 ug/kg/min | INTRAVENOUS | Status: DC
  Administered 2017-12-23 (×2): 0.3 ug/kg/min via INTRAVENOUS
  Administered 2017-12-24: 0.125 ug/kg/min via INTRAVENOUS
  Filled 2017-12-23 (×2): qty 100

## 2017-12-23 MED ORDER — FENTANYL CITRATE (PF) 250 MCG/5ML IJ SOLN
INTRAMUSCULAR | Status: AC
  Filled 2017-12-23: qty 25

## 2017-12-23 MED ORDER — MIDAZOLAM HCL 5 MG/5ML IJ SOLN
INTRAMUSCULAR | Status: DC | PRN
  Administered 2017-12-23: 2 mg via INTRAVENOUS
  Administered 2017-12-23: 1 mg via INTRAVENOUS
  Administered 2017-12-23: 2 mg via INTRAVENOUS
  Administered 2017-12-23: 1 mg via INTRAVENOUS
  Administered 2017-12-23 (×3): 2 mg via INTRAVENOUS
  Administered 2017-12-23: 4 mg via INTRAVENOUS

## 2017-12-23 MED ORDER — DEXMEDETOMIDINE HCL IN NACL 400 MCG/100ML IV SOLN: 0.0000 ug/kg/h | INTRAVENOUS | Status: DC

## 2017-12-23 MED ORDER — ALBUMIN HUMAN 5 % IV SOLN
250.0000 mL | INTRAVENOUS | Status: AC | PRN
  Administered 2017-12-23 (×4): 12.5 g via INTRAVENOUS

## 2017-12-23 SURGICAL SUPPLY — 93 items
BAG DECANTER FOR FLEXI CONT (MISCELLANEOUS) ×9 IMPLANT
BANDAGE ACE 4X5 VEL STRL LF (GAUZE/BANDAGES/DRESSINGS) ×6 IMPLANT
BANDAGE ACE 6X5 VEL STRL LF (GAUZE/BANDAGES/DRESSINGS) ×6 IMPLANT
BANDAGE ELASTIC 4 VELCRO ST LF (GAUZE/BANDAGES/DRESSINGS) ×6 IMPLANT
BANDAGE ELASTIC 6 VELCRO ST LF (GAUZE/BANDAGES/DRESSINGS) ×6 IMPLANT
BLADE STERNUM SYSTEM 6 (BLADE) ×3 IMPLANT
BLADE SURG 11 STRL SS (BLADE) ×3 IMPLANT
BNDG GAUZE ELAST 4 BULKY (GAUZE/BANDAGES/DRESSINGS) ×6 IMPLANT
BOOT SUTURE AID YELLOW STND (SUTURE) ×3 IMPLANT
CANISTER SUCT 3000ML PPV (MISCELLANEOUS) ×3 IMPLANT
CANNULA VESSEL 3MM 2 BLNT TIP (CANNULA) ×3 IMPLANT
CATH CPB KIT GERHARDT (MISCELLANEOUS) ×3 IMPLANT
CATH ROBINSON RED A/P 18FR (CATHETERS) ×3 IMPLANT
CATH THORACIC 28FR (CATHETERS) ×3 IMPLANT
COVER MAYO STAND STRL (DRAPES) ×3 IMPLANT
COVER WAND RF STERILE (DRAPES) ×3 IMPLANT
CRADLE DONUT ADULT HEAD (MISCELLANEOUS) ×3 IMPLANT
DRAIN CHANNEL 28F RND 3/8 FF (WOUND CARE) ×3 IMPLANT
DRAPE CARDIOVASCULAR INCISE (DRAPES) ×1
DRAPE SLUSH/WARMER DISC (DRAPES) ×3 IMPLANT
DRAPE SRG 135X102X78XABS (DRAPES) ×2 IMPLANT
DRSG AQUACEL AG ADV 3.5X 6 (GAUZE/BANDAGES/DRESSINGS) ×3 IMPLANT
DRSG AQUACEL AG ADV 3.5X14 (GAUZE/BANDAGES/DRESSINGS) ×3 IMPLANT
ELECT BLADE 4.0 EZ CLEAN MEGAD (MISCELLANEOUS) ×3
ELECT CAUTERY BLADE 6.4 (BLADE) ×3 IMPLANT
ELECT REM PT RETURN 9FT ADLT (ELECTROSURGICAL) ×6
ELECTRODE BLDE 4.0 EZ CLN MEGD (MISCELLANEOUS) ×2 IMPLANT
ELECTRODE REM PT RTRN 9FT ADLT (ELECTROSURGICAL) ×4 IMPLANT
FELT TEFLON 1X6 (MISCELLANEOUS) ×3 IMPLANT
GAUZE SPONGE 4X4 12PLY STRL (GAUZE/BANDAGES/DRESSINGS) ×9 IMPLANT
GAUZE SPONGE 4X4 12PLY STRL LF (GAUZE/BANDAGES/DRESSINGS) ×9 IMPLANT
GLOVE BIO SURGEON STRL SZ 6.5 (GLOVE) ×24 IMPLANT
GLOVE BIOGEL PI IND STRL 7.5 (GLOVE) ×2 IMPLANT
GLOVE BIOGEL PI INDICATOR 7.5 (GLOVE) ×1
GOWN STRL REUS W/ TWL LRG LVL3 (GOWN DISPOSABLE) ×8 IMPLANT
GOWN STRL REUS W/TWL LRG LVL3 (GOWN DISPOSABLE) ×4
HEMOSTAT POWDER SURGIFOAM 1G (HEMOSTASIS) ×6 IMPLANT
HEMOSTAT SURGICEL 2X14 (HEMOSTASIS) ×6 IMPLANT
KIT BASIN OR (CUSTOM PROCEDURE TRAY) ×3 IMPLANT
KIT CATH SUCT 8FR (CATHETERS) ×3 IMPLANT
KIT SUCTION CATH 14FR (SUCTIONS) ×6 IMPLANT
KIT TURNOVER KIT B (KITS) ×3 IMPLANT
KIT VASOVIEW HEMOPRO VH 3000 (KITS) ×3 IMPLANT
LEAD PACING MYOCARDI (MISCELLANEOUS) ×3 IMPLANT
LOOP VESSEL MAXI BLUE (MISCELLANEOUS) ×3 IMPLANT
LOOP VESSEL MINI RED (MISCELLANEOUS) ×3 IMPLANT
LOOP VESSEL SUPERMAXI WHITE (MISCELLANEOUS) ×3 IMPLANT
MARKER GRAFT CORONARY BYPASS (MISCELLANEOUS) ×9 IMPLANT
NS IRRIG 1000ML POUR BTL (IV SOLUTION) ×15 IMPLANT
PACK E OPEN HEART (SUTURE) ×3 IMPLANT
PACK OPEN HEART (CUSTOM PROCEDURE TRAY) ×3 IMPLANT
PAD ARMBOARD 7.5X6 YLW CONV (MISCELLANEOUS) ×6 IMPLANT
PAD ELECT DEFIB RADIOL ZOLL (MISCELLANEOUS) ×3 IMPLANT
PENCIL BUTTON HOLSTER BLD 10FT (ELECTRODE) ×3 IMPLANT
PUNCH AORTIC ROTATE 4.0MM (MISCELLANEOUS) ×3 IMPLANT
SET CARDIOPLEGIA MPS 5001102 (MISCELLANEOUS) ×3 IMPLANT
SOLUTION ANTI FOG 6CC (MISCELLANEOUS) ×3 IMPLANT
SPONGE LAP 18X18 X RAY DECT (DISPOSABLE) ×24 IMPLANT
SURGIFLO W/THROMBIN 8M KIT (HEMOSTASIS) ×3 IMPLANT
SUT BONE WAX W31G (SUTURE) ×3 IMPLANT
SUT MNCRL AB 4-0 PS2 18 (SUTURE) ×6 IMPLANT
SUT PROLENE 3 0 SH1 36 (SUTURE) ×6 IMPLANT
SUT PROLENE 4 0 TF (SUTURE) ×6 IMPLANT
SUT PROLENE 5 0 C 1 24 (SUTURE) ×9 IMPLANT
SUT PROLENE 5 0 C 1 36 (SUTURE) ×6 IMPLANT
SUT PROLENE 6 0 BV (SUTURE) ×6 IMPLANT
SUT PROLENE 6 0 C 1 30 (SUTURE) ×9 IMPLANT
SUT PROLENE 6 0 CC (SUTURE) ×6 IMPLANT
SUT PROLENE 7 0 BV 1 (SUTURE) IMPLANT
SUT PROLENE 7 0 BV1 MDA (SUTURE) ×6 IMPLANT
SUT PROLENE 7.0 RB 3 (SUTURE) ×6 IMPLANT
SUT PROLENE 8 0 BV175 6 (SUTURE) ×21 IMPLANT
SUT STEEL 6MS V (SUTURE) ×3 IMPLANT
SUT STEEL SZ 6 DBL 3X14 BALL (SUTURE) ×3 IMPLANT
SUT VIC AB 1 CTX 18 (SUTURE) ×9 IMPLANT
SUT VIC AB 2-0 CT1 27 (SUTURE) ×2
SUT VIC AB 2-0 CT1 TAPERPNT 27 (SUTURE) ×4 IMPLANT
SUT VIC AB 2-0 CTX 27 (SUTURE) ×3 IMPLANT
SUT VIC AB 3-0 SH 27 (SUTURE) ×1
SUT VIC AB 3-0 SH 27X BRD (SUTURE) ×2 IMPLANT
SUT VIC AB 3-0 SH 8-18 (SUTURE) ×3 IMPLANT
SUT VICRYL 4-0 PS2 18IN ABS (SUTURE) ×3 IMPLANT
SYSTEM SAHARA CHEST DRAIN ATS (WOUND CARE) ×3 IMPLANT
TAPE CLOTH SURG 4X10 WHT LF (GAUZE/BANDAGES/DRESSINGS) ×9 IMPLANT
TAPE PAPER 2X10 WHT MICROPORE (GAUZE/BANDAGES/DRESSINGS) ×3 IMPLANT
TOWEL GREEN STERILE (TOWEL DISPOSABLE) ×3 IMPLANT
TOWEL GREEN STERILE FF (TOWEL DISPOSABLE) ×3 IMPLANT
TRAY FOLEY SLVR 16FR TEMP STAT (SET/KITS/TRAYS/PACK) ×3 IMPLANT
TUBE CONNECTING 12X1/4 (SUCTIONS) ×3 IMPLANT
TUBING INSUFFLATION (TUBING) ×3 IMPLANT
UNDERPAD 30X30 (UNDERPADS AND DIAPERS) ×3 IMPLANT
WATER STERILE IRR 1000ML POUR (IV SOLUTION) ×6 IMPLANT
YANKAUER SUCT BULB TIP NO VENT (SUCTIONS) ×3 IMPLANT

## 2017-12-23 NOTE — Progress Notes (Signed)
CT surgery p.m. Rounds  Patient responsive with stable hemodynamics after multivessel CABG and right vertebral artery repair Appears patient will be candidate for ventilator weaning pro tocol and extubation later this evening Minimal chest tube output

## 2017-12-23 NOTE — Progress Notes (Signed)
VASCULAR SURGERY:  This patient is scheduled for coronary revascularization.  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  For this reason I have recommended removal of the sheath under direct exploration in the operating room.  I have discussed the indications of the procedure with the patient and also his parents in the waiting room.  We have discussed the potential complications including small risk of stroke and he is agreeable to proceed.  Deitra Mayo, MD, [place] [phone] Office: [phone]

## 2017-12-23 NOTE — Anesthesia Procedure Notes (Signed)
Procedure Name: Intubation Date/Time: 12/23/2017 7:58 AM Performed by: Gaylene Brooks, CRNA Pre-anesthesia Checklist: Patient identified, Emergency Drugs available, Suction available and Patient being monitored Patient Re-evaluated:Patient Re-evaluated prior to induction Oxygen Delivery Method: Circle System Utilized Preoxygenation: Pre-oxygenation with 100% oxygen Induction Type: IV induction Ventilation: Mask ventilation without difficulty Laryngoscope Size: Miller and 2 Grade View: Grade I Tube type: Oral Tube size: 8.0 mm Number of attempts: 1 Airway Equipment and Method: Stylet and Oral airway Placement Confirmation: ETT inserted through vocal cords under direct vision,  positive ETCO2 and breath sounds checked- equal and bilateral Secured at: 23 cm Tube secured with: Tape Dental Injury: Teeth and Oropharynx as per pre-operative assessment

## 2017-12-23 NOTE — Anesthesia Preprocedure Evaluation (Addendum)
Anesthesia Evaluation  Patient identified by MRN, date of birth, ID band Patient awake    Reviewed: Allergy & Precautions, NPO status , Patient's Chart, lab work & pertinent test results  Airway Mallampati: II  TM Distance: >3 FB     Dental   Pulmonary neg pulmonary ROS,    breath sounds clear to auscultation       Cardiovascular hypertension, + Past MI   Rhythm:Regular Rate:Normal     Neuro/Psych Seizures -,     GI/Hepatic negative GI ROS, Neg liver ROS,   Endo/Other  negative endocrine ROS  Renal/GU negative Renal ROS     Musculoskeletal   Abdominal   Peds  Hematology   Anesthesia Other Findings   Reproductive/Obstetrics                             Anesthesia Physical Anesthesia Plan  ASA: III  Anesthesia Plan: General   Post-op Pain Management:    Induction: Intravenous  PONV Risk Score and Plan: Treatment may vary due to age or medical condition, Ondansetron and Midazolam  Airway Management Planned: Oral ETT  Additional Equipment:   Intra-op Plan:   Post-operative Plan: Post-operative intubation/ventilation  Informed Consent: I have reviewed the patients History and Physical, chart, labs and discussed the procedure including the risks, benefits and alternatives for the proposed anesthesia with the patient or authorized representative who has indicated his/her understanding and acceptance.   Dental advisory given  Plan Discussed with: CRNA, Anesthesiologist and Surgeon  Anesthesia Plan Comments:        Anesthesia Quick Evaluation

## 2017-12-23 NOTE — Progress Notes (Addendum)
Transported pt to OR. VS stable . Report given at bedside to [facility].  Saunders Revel T

## 2017-12-23 NOTE — Procedures (Signed)
Extubation Procedure Note  Patient Details:   Name: Paul Duncan DOB: 09-27-1975 MRN: [id number]   Airway Documentation:    Vent end date: 12/23/17 Vent end time: 2135   Evaluation  O2 sats: stable throughout Complications: No apparent complications Patient did tolerate procedure well. Bilateral Breath Sounds: Clear, Diminished   Yes   NIF -22 FVC 1L. Patient extubated to 4L [place] and able to speak his name. Incentive spirometer 562ml.  Alvera Singh 12/23/2017, 9:38 PM

## 2017-12-23 NOTE — Anesthesia Postprocedure Evaluation (Signed)
Anesthesia Post Note  Patient: LORCAN SHELP  Procedure(s) Performed: CORONARY ARTERY BYPASS GRAFTING (CABG) x 5: Using Left Internal Mammary Artery (LIMA) and Endoscopically Harvested Right Thigh/Calf and Left Thigh Greater Saphenous Vein Grafts (SVG);   LIMA to LAD, SVG to Diag1, SVG to PDA, SVG to OM1 and OM2; / and Repair of Vertebral Laceration and Removal of Swan Ganz Introducer (N/A Chest) TRANSESOPHAGEAL ECHOCARDIOGRAM (TEE) (N/A )     Patient location during evaluation: SICU Anesthesia Type: General Level of consciousness: patient remains intubated per anesthesia plan Pain management: pain level controlled Vital Signs Assessment: post-procedure vital signs reviewed and stable Respiratory status: patient remains intubated per anesthesia plan Cardiovascular status: stable Postop Assessment: no apparent nausea or vomiting Anesthetic complications: yes Anesthetic complication details: anesthesia complications   Last Vitals:  Vitals:   12/23/17 1729 12/23/17 1800  BP:  96/65  Pulse:  89  Resp:  14  Temp:  (!) 35.7 C  SpO2: 97% 99%    Last Pain:  Vitals:   12/23/17 0509  TempSrc: Oral  PainSc: 0-No pain                 Vedanshi Massaro

## 2017-12-23 NOTE — Transfer of Care (Signed)
Immediate Anesthesia Transfer of Care Note  Patient: Paul Duncan  Procedure(s) Performed: CORONARY ARTERY BYPASS GRAFTING (CABG) x 5: Using Left Internal Mammary Artery (LIMA) and Endoscopically Harvested Right Thigh/Calf and Left Thigh Greater Saphenous Vein Grafts (SVG);   LIMA to LAD, SVG to Diag1, SVG to PDA, SVG to OM1 and OM2; / and Repair of Vertebral Laceration and Removal of Swan Ganz Introducer (N/A Chest) TRANSESOPHAGEAL ECHOCARDIOGRAM (TEE) (N/A )  Patient Location: SICU  Anesthesia Type:General  Level of Consciousness: sedated and Patient remains intubated per anesthesia plan  Airway & Oxygen Therapy: Patient remains intubated per anesthesia plan and Patient placed on Ventilator (see vital sign flow sheet for setting)  Post-op Assessment: Report given to RN and Post -op Vital signs reviewed and stable  Post vital signs: Reviewed and stable  Last Vitals:  Vitals Value Taken Time  BP    Temp    Pulse 94 12/23/2017  5:33 PM  Resp 12 12/23/2017  5:33 PM  SpO2 94 % 12/23/2017  5:33 PM  Vitals shown include unvalidated device data.  Last Pain:  Vitals:   12/23/17 0509  TempSrc: Oral  PainSc: 0-No pain      Patients Stated Pain Goal: 0 (02/77/41 2878)  Complications: No apparent anesthesia complications

## 2017-12-23 NOTE — Anesthesia Procedure Notes (Signed)
Central Venous Catheter Insertion Performed by: Albertha Ghee, MD, anesthesiologist Start/End10/14/2019 6:56 AM, 12/23/2017 7:07 AM Patient location: Pre-op. Preanesthetic checklist: patient identified, IV checked, site marked, risks and benefits discussed, surgical consent, monitors and equipment checked, pre-op evaluation, timeout performed and anesthesia consent Position: Trendelenburg Lidocaine 1% used for infiltration and patient sedated Hand hygiene performed , maximum sterile barriers used  and Seldinger technique used Catheter size: 8.5 Fr Central line and PA cath was placed.Sheath introducer Swan type:thermodilation Procedure performed using ultrasound guided technique. Ultrasound Notes:anatomy identified, needle tip was noted to be adjacent to the nerve/plexus identified, no ultrasound evidence of intravascular and/or intraneural injection and image(s) printed for medical record Attempts: 1 Following insertion, line sutured, dressing applied and Biopatch. Post procedure assessment: blood return through all ports, free fluid flow and no air  Patient tolerated the procedure well with no immediate complications.

## 2017-12-23 NOTE — Anesthesia Procedure Notes (Signed)
Arterial Line Insertion Start/End10/14/2019 6:50 AM Performed by: Gaylene Brooks, CRNA  Patient location: Pre-op. Preanesthetic checklist: patient identified, IV checked, site marked, risks and benefits discussed, surgical consent, monitors and equipment checked, pre-op evaluation, timeout performed and anesthesia consent Lidocaine 1% used for infiltration and patient sedated Left, radial was placed Catheter size: 20 G Hand hygiene performed  and maximum sterile barriers used   Attempts: 1 Procedure performed without using ultrasound guided technique.

## 2017-12-23 NOTE — Progress Notes (Signed)
      [address]       [place] [address]             [phone]      Patient ID: Paul Duncan, male   DOB: [birth year], 42 y.o.   MRN: [id number] Pre Procedure note for inpatients:   DAINEL ARCIDIACONO has been scheduled for Procedure(s): CORONARY ARTERY BYPASS GRAFTING (CABG) (N/A) TRANSESOPHAGEAL ECHOCARDIOGRAM (TEE) (N/A) today. The various methods of treatment have been discussed with the patient. After consideration of the risks, benefits and treatment options the patient has consented to the planned procedure.   The patient has been seen and labs reviewed. There are no changes in the patient's condition to prevent proceeding with the planned procedure today.  Recent labs:  Lab Results  Component Value Date   WBC 15.3 (H) 12/23/2017   HGB 13.9 12/23/2017   HCT 41.6 12/23/2017   PLT 321 12/23/2017   GLUCOSE 101 (H) 12/23/2017   CHOL 224 (H) 12/18/2017   TRIG 200 (H) 12/18/2017   HDL 37 (L) 12/18/2017   LDLCALC 147 (H) 12/18/2017   ALT 20 12/22/2017   AST 21 12/22/2017   NA 138 12/23/2017   K 3.5 12/23/2017   CL 104 12/23/2017   CREATININE 0.97 12/23/2017   BUN 14 12/23/2017   CO2 26 12/23/2017   INR 1.09 12/22/2017   HGBA1C 5.3 12/18/2017   come to see patient this am, he is awake and alert , swan introducer was placed in the right carotid by anesthesiologist   , documented by pressure tracing. Emergent vascular consult requested by Dr Doren Custard. Will plan direct removal of cath and artery repair, and if  proceed with cabg Discussed with patient and parents . Patient is awake and alert , neuro intact   [name], MD 12/23/2017 7:37 AM

## 2017-12-23 NOTE — Op Note (Signed)
NAME: Paul Duncan    MRN: [id number] DOB: 1975-06-12    DATE OF OPERATION: 12/23/2017  PREOP DIAGNOSIS:    Placement of sheath in right carotid artery  POSTOP DIAGNOSIS:    Placement and sheath at origin of right vertebral artery adjacent to subclavian artery.  PROCEDURE:    1.  Exploration of the right neck 2.  Repair of right internal jugular vein 3.  Repair of injury to the origin of the right vertebral artery  CO-SURGEON's: Judeth Cornfield. Scot Dock, MD, Lanelle Bal, MD  ANESTHESIA: General  EBL: CF anesthesia records  INDICATIONS:    Paul Duncan is a 42 y.o. male who was scheduled for coronary revascularization today.  A Swan introducer sheath was placed in the right neck and it was noted that the waveform showed an arterial waveform.  It was felt that this was likely in the common carotid artery.  I recommended repair in the operating room.  FINDINGS:   The catheter had gone through the anterior and posterior walls of the internal jugular vein but had entered the artery at the takeoff of the vertebral artery from the subclavian artery on the right.  TECHNIQUE:   The patient was taken to the operating room and received general anesthetic.  Monitoring lines have been placed by anesthesia.  The right neck was prepped and draped in the usual sterile fashion as well as the chest and lower extremities for planned coronary revascularization.  The catheter was left in place.  The suture was removed that was holding this in place.  Of note I did look with the SonoSite preoperatively and did not see any significant calcific disease in the artery.  I made a longitudinal incision above and below the site where the catheter entered.  This went through the subclavian muscle and therefore rather than try to mobilize the subclavian muscle medially I dissected through the subclavian muscle.  It became apparent that the catheter went through the anterior and posterior walls  of the internal jugular vein into the artery.  I was able to separate the internal jugular vein from the deeper tissues.  I then controlled the internal jugular vein above and below the site where the catheter entered with Vesseloops to apply proximal distal control.  The patient was heparinized.  I clamped the catheter below the vein and then remove the catheter from the vein.  The anterior venotomy was closed with a running 5-0 Prolene suture.  The posterior venotomy was also closed with a running 5-0 Prolene suture.  The loops were then released and there was good flow in the right internal jugular vein.  I dissected out the common carotid artery which did not appear to be injured and was much further medially.  I therefore followed the catheter into the deeper tissues and this appeared to be headed towards the subclavian artery.  The catheter ultimately came out and there was some arterial bleeding which was controlled with a 5-0 Prolene suture.  This provided that her hemostasis.  Based on the location of the bleeding it was felt that this was likely from the subclavian artery or possibly even the vertebral artery and after discussion with Dr. Servando Snare we felt that it would be safest to proceed with median sternotomy to allow better exposure and allow [place] to get proximal control of the subclavian artery.  This exposure is dictated separately by Dr. Servando Snare.  Once the subclavian artery was controlled right after the bifurcation of the  innominate artery, the distal subclavian was controlled, and the vertebral artery was identified so that it could be clamped.  The thyrocervical trunk was then blocking view of where the artery appeared to be bleeding from the superior posterior wall of the subclavian artery adjacent to the vertebral artery.  Therefore the thyrocervical trunk was divided between 2-0 silk ties and this provided excellent exposure of the area of concern.  It became clear that this site of injury  was right where the vertebral artery came off of the subclavian artery.  Once this was clearly identified this was closed with a pledgeted 5-0 Prolene suture.  Clamps were then released and there was excellent hemostasis.  I then interrogated with the Doppler and there was excellent flow both in the vertebral artery and in the distal subclavian artery.  The remainder of the dictation is as per Dr. Servando Snare.  Deitra Mayo, MD, FACS [facility]  DATE OF DICTATION:   12/23/2017

## 2017-12-23 NOTE — Anesthesia Procedure Notes (Signed)
Central Venous Catheter Insertion Performed by: Albertha Ghee, MD, anesthesiologist Start/End10/14/2019 6:38 AM, 12/23/2017 6:49 AM Patient location: Pre-op. Preanesthetic checklist: patient identified, IV checked, site marked, risks and benefits discussed, surgical consent, monitors and equipment checked, pre-op evaluation, timeout performed and anesthesia consent Position: Trendelenburg Lidocaine 1% used for infiltration and patient sedated Hand hygiene performed , maximum sterile barriers used  and Seldinger technique used Catheter size: 8.5 Fr Central line was placed.Sheath introducer Procedure performed using ultrasound guided technique. Ultrasound Notes:anatomy identified, needle tip was noted to be adjacent to the nerve/plexus identified, no ultrasound evidence of intravascular and/or intraneural injection and image(s) printed for medical record Attempts: 1 Following insertion, line sutured, dressing applied and Biopatch. Post procedure assessment: blood return through all ports and no air  Post procedure complications: arterial puncture. Patient tolerated the procedure well with no immediate complications. Additional procedure comments: Access was obtained with US guidance.  Wire was visualized in the internal jugular vein.  Central line was placed over wire.  While flushing the line, the blood return appeared bright red and pulsatile.  Pressures were transduced and revealed arterial pressures.  The line was sutured into place and dressed sterily.  Vascular surgery was consulted to address in the OR.  Pt was made aware of the situation.  Marland Kitchen

## 2017-12-23 NOTE — Progress Notes (Signed)
  Echocardiogram 2D Echocardiogram has been performed.  Merrie Roof F 12/23/2017, 12:09 PM

## 2017-12-23 NOTE — Brief Op Note (Addendum)
      [address]       [place] [address]             [phone]      12/23/2017  6:41 PM  PATIENT:  Paul Duncan  42 y.o. male  PRE-OPERATIVE DIAGNOSIS:  CAD  POST-OPERATIVE DIAGNOSIS:  CAD  PROCEDURE:  Procedure(s): CORONARY ARTERY BYPASS GRAFTING (CABG) x 5: Using Left Internal Mammary Artery (LIMA) and Endoscopically Harvested Right Thigh/Calf and Left Thigh Greater Saphenous Vein Grafts (SVG);   LIMA to LAD, SVG to Diag1, SVG to PDA, SVG to OM1 and OM2; / and Repair of Vertebral artery injury by preop line placement  and Removal of Swan Ganz Introducer (N/A) TRANSESOPHAGEAL ECHOCARDIOGRAM (TEE) (N/A)  LIMA to LAD SVG to Diag1 SVG to PDA SVG to OM1 and OM2  SURGEON:  Surgeon(s) and Role:    * [name], MD - Primary    * Angelia Mould, MD - Co-Surgeon   PHYSICIAN ASSISTANT:  Nicholes Rough, PA-C  ANESTHESIA:   general  EBL:  1600 mL   BLOOD ADMINISTERED: 1 UNIT PRBC  DRAINS: ROUTINE   LOCAL MEDICATIONS USED:  NONE  SPECIMEN:  No Specimen  DISPOSITION OF SPECIMEN:  N/A  COUNTS:  YES  DICTATION: .Dragon Dictation  PLAN OF CARE: Admit to inpatient   PATIENT DISPOSITION:  ICU - intubated and hemodynamically stable.   Delay start of Pharmacological VTE agent (>24hrs) due to surgical blood loss or risk of bleeding: yes

## 2017-12-24 ENCOUNTER — Encounter (HOSPITAL_COMMUNITY): Payer: Self-pay | Admitting: Cardiothoracic Surgery

## 2017-12-24 ENCOUNTER — Inpatient Hospital Stay (HOSPITAL_COMMUNITY): Payer: Managed Care, Other (non HMO)

## 2017-12-24 DIAGNOSIS — Z951 Presence of aortocoronary bypass graft: Secondary | ICD-10-CM

## 2017-12-24 LAB — GLUCOSE, CAPILLARY
GLUCOSE-CAPILLARY: 99 mg/dL (ref 70–99)
GLUCOSE-CAPILLARY: 101 mg/dL — AB (ref 70–99)
GLUCOSE-CAPILLARY: 101 mg/dL — AB (ref 70–99)
GLUCOSE-CAPILLARY: 136 mg/dL — AB (ref 70–99)
Glucose-Capillary: 115 mg/dL — ABNORMAL HIGH (ref 70–99)
Glucose-Capillary: 119 mg/dL — ABNORMAL HIGH (ref 70–99)
Glucose-Capillary: 138 mg/dL — ABNORMAL HIGH (ref 70–99)
Glucose-Capillary: 154 mg/dL — ABNORMAL HIGH (ref 70–99)
Glucose-Capillary: 97 mg/dL (ref 70–99)

## 2017-12-24 LAB — COOXEMETRY PANEL
Carboxyhemoglobin: 1.6 % — ABNORMAL HIGH (ref 0.5–1.5)
Methemoglobin: 1.6 % — ABNORMAL HIGH (ref 0.0–1.5)
O2 Saturation: 68.1 %
Total hemoglobin: 8.2 g/dL — ABNORMAL LOW (ref 12.0–16.0)

## 2017-12-24 LAB — POCT I-STAT, CHEM 8
BUN: 17 mg/dL (ref 6–20)
CHLORIDE: 102 mmol/L (ref 98–111)
CREATININE: 1 mg/dL (ref 0.61–1.24)
Calcium, Ion: 1.14 mmol/L — ABNORMAL LOW (ref 1.15–1.40)
GLUCOSE: 176 mg/dL — AB (ref 70–99)
HEMATOCRIT: 24 % — AB (ref 39.0–52.0)
Hemoglobin: 8.2 g/dL — ABNORMAL LOW (ref 13.0–17.0)
POTASSIUM: 4.4 mmol/L (ref 3.5–5.1)
Sodium: 136 mmol/L (ref 135–145)
TCO2: 22 mmol/L (ref 22–32)

## 2017-12-24 LAB — CBC
HEMATOCRIT: 25.1 % — AB (ref 39.0–52.0)
HEMATOCRIT: 24.4 % — AB (ref 39.0–52.0)
HEMOGLOBIN: 8.5 g/dL — AB (ref 13.0–17.0)
Hemoglobin: 8.6 g/dL — ABNORMAL LOW (ref 13.0–17.0)
MCH: 29.6 pg (ref 26.0–34.0)
MCH: 30.1 pg (ref 26.0–34.0)
MCHC: 34.3 g/dL (ref 30.0–36.0)
MCHC: 34.8 g/dL (ref 30.0–36.0)
MCV: 86.3 fL (ref 80.0–100.0)
MCV: 86.5 fL (ref 80.0–100.0)
NRBC: 0 % (ref 0.0–0.2)
Platelets: 169 10*3/uL (ref 150–400)
Platelets: 201 10*3/uL (ref 150–400)
RBC: 2.91 MIL/uL — ABNORMAL LOW (ref 4.22–5.81)
RBC: 2.82 MIL/uL — AB (ref 4.22–5.81)
RDW: 12.6 % (ref 11.5–15.5)
RDW: 12.8 % (ref 11.5–15.5)
WBC: 16.9 10*3/uL — AB (ref 4.0–10.5)
WBC: 24.2 10*3/uL — ABNORMAL HIGH (ref 4.0–10.5)
nRBC: 0 % (ref 0.0–0.2)

## 2017-12-24 LAB — BASIC METABOLIC PANEL
Anion gap: 6 (ref 5–15)
BUN: 13 mg/dL (ref 6–20)
CALCIUM: 7.8 mg/dL — AB (ref 8.9–10.3)
CO2: 20 mmol/L — AB (ref 22–32)
CREATININE: 0.93 mg/dL (ref 0.61–1.24)
Chloride: 111 mmol/L (ref 98–111)
GLUCOSE: 107 mg/dL — AB (ref 70–99)
Potassium: 4.5 mmol/L (ref 3.5–5.1)
Sodium: 137 mmol/L (ref 135–145)

## 2017-12-24 LAB — CREATININE, SERUM
CREATININE: 1.08 mg/dL (ref 0.61–1.24)
GFR calc Af Amer: >60 mL/min (ref >60)
GFR calc non Af Amer: >60 mL/min (ref >60)

## 2017-12-24 LAB — PLATELET COUNT: Platelets: 148 10*3/uL — ABNORMAL LOW (ref 150–400)

## 2017-12-24 LAB — MAGNESIUM
MAGNESIUM: 2.1 mg/dL (ref 1.7–2.4)
Magnesium: 2.5 mg/dL — ABNORMAL HIGH (ref 1.7–2.4)

## 2017-12-24 MED ORDER — INSULIN ASPART 100 UNIT/ML ~~LOC~~ SOLN: 0.0000 [IU] | SUBCUTANEOUS | Status: DC

## 2017-12-24 MED ORDER — ENOXAPARIN SODIUM 30 MG/0.3ML ~~LOC~~ SOLN
30.0000 mg | Freq: Every day | SUBCUTANEOUS | Status: DC
  Administered 2017-12-24 – 2017-12-31 (×8): 30 mg via SUBCUTANEOUS
  Filled 2017-12-24 (×8): qty 0.3

## 2017-12-24 MED ORDER — PHENYLEPHRINE HCL-NACL 40-0.9 MG/250ML-% IV SOLN
0.0000 ug/min | INTRAVENOUS | Status: DC
  Administered 2017-12-24: 55 ug/min via INTRAVENOUS
  Administered 2017-12-24: 40 ug/min via INTRAVENOUS
  Administered 2017-12-24: 54.933 ug/min via INTRAVENOUS
  Filled 2017-12-24 (×3): qty 250

## 2017-12-24 MED ORDER — FUROSEMIDE 10 MG/ML IJ SOLN
40.0000 mg | Freq: Once | INTRAMUSCULAR | Status: AC
  Administered 2017-12-24: 40 mg via INTRAVENOUS

## 2017-12-24 MED ORDER — FUROSEMIDE 10 MG/ML IJ SOLN
INTRAMUSCULAR | Status: AC
  Filled 2017-12-24: qty 4

## 2017-12-24 MED ORDER — INSULIN ASPART 100 UNIT/ML ~~LOC~~ SOLN
0.0000 [IU] | SUBCUTANEOUS | Status: DC
  Administered 2017-12-24 – 2017-12-25 (×6): 2 [IU] via SUBCUTANEOUS
  Administered 2017-12-25: 4 [IU] via SUBCUTANEOUS

## 2017-12-24 MED FILL — Heparin Sodium (Porcine) Inj 1000 Unit/ML: INTRAMUSCULAR | Qty: 30 | Status: AC

## 2017-12-24 MED FILL — Potassium Chloride Inj 2 mEq/ML: INTRAVENOUS | Qty: 40 | Status: AC

## 2017-12-24 MED FILL — Magnesium Sulfate Inj 50%: INTRAMUSCULAR | Qty: 10 | Status: AC

## 2017-12-24 NOTE — Plan of Care (Signed)
  Problem: Education: Goal: Knowledge of the prescribed therapeutic regimen will improve Outcome: Progressing   Problem: Activity: Goal: Risk for activity intolerance will decrease Outcome: Progressing   Problem: Clinical Measurements: Goal: Postoperative complications will be avoided or minimized Outcome: Progressing   Problem: Respiratory: Goal: Respiratory status will improve Outcome: Progressing   Problem: Urinary Elimination: Goal: Ability to achieve and maintain adequate renal perfusion and functioning will improve Outcome: Progressing   Problem: Cardiac: Goal: Will achieve and/or maintain hemodynamic stability Outcome: Not Progressing

## 2017-12-24 NOTE — Op Note (Signed)
NAME: Paul Duncan, Paul Duncan MEDICAL RECORD [id number] ACCOUNT [id number] DATE OF BIRTH:[birth year] FACILITY: [facility] LOCATION: [facility] PHYSICIAN:Geral Coker Maryruth Bun, MD  OPERATIVE REPORT  DATE OF PROCEDURE:  12/23/2017  PREOPERATIVE DIAGNOSES:   1.  Recent anterior myocardial infarction.   2.  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  POSTOPERATIVE DIAGNOSES:   1.  Recent anterior myocardial infarction.   2.  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 3.  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  PROCEDURES PERFORMED:   1.  Exploration of right neck, control of subclavian artery and vertebral artery and repair of the vertebral artery. 2.  Coronary artery bypass grafting x5 with the left internal mammary to the left anterior descending coronary artery, reverse saphenous vein graft to the diagonal coronary artery, sequential reverse saphenous vein graft to the first and second obtuse  marginal, reverse saphenous vein graft to the posterior descending coronary artery with right thigh and calf greater saphenous endoscopic vein harvesting and left thigh endoscopic vein harvesting of the left greater saphenous vein.  SURGEON:  Lanelle Bal, MD  FIRST ASSISTANT:  Nicholes Rough, PA.  BRIEF HISTORY:  The patient is a 42 year old male who has a strong family history of coronary occlusive disease, who presented with anterior myocardial infarction.  He had decreased ejection fraction of approximately 35-40% with anterior hypokinesis to  akinesis.  Preoperative cardiac MRI showed probable viability of the anterior wall.  Coronary artery bypass grafting was recommended to the patient who agreed and signed informed consent.  On the morning of surgery, anesthesia had placed a Swan-Ganz  sleeve into the right neck, which when transduced  confirmed arterial waveform.  Before any discussion with the surgeons,  a left Swan-Ganz was placed and floated.  The right sheath had been left in place, the patient at this point was awakened,  neurologically intact.  The clinical situation of a large bore sheath on heparin deep in the neck was explained to the patient.  Dr. Doren Custard, vascular surgery, was rapidly consulted and we proceeded with moving the patient to the operating room and  proceeding with removal of the sheath under direct vision and control of the vessel.  DESCRIPTION OF PROCEDURE:  It was noted the patient was brought to the operating room awake, neurologically intact.  He underwent general endotracheal anesthesia.  The right neck, chest, abdomen and pelvis was all prepped with Betadine and draped as 1  sterile field.  Dictated under separate note by Dr. Scot Dock, the right neck was explored.  It was obvious that the sheath had gone through and through the right internal jugular vein and deep into the neck.  The carotid artery was more medial, so it  became obvious that the sheath had not gone into the carotid artery as initially suspected, but probably into the subclavian artery.  With site of bleeding controlled initially by the catheter and then by digital pressure, we then performed a sternotomy.   The pericardium was opened and pericardial cradle was created.  Dissection was carried up around the innominate vein, which was retracted out of the way.  The sternal incision was then carried up and met the neck incision.  The head of the  sternocleidomastoid muscle was divided.  We were able to dissect and identify the innominate artery at the takeoff of the carotid artery and the subclavian artery.  The jugular vein head was retracted  laterally.  The vagus nerve was preserved.  We then  dissected further into the depths of the incision.  The thyrocervical trunk was identified and divided.  The patient was heparinized.  The subclavian  artery just distal to the takeoff of the carotid artery was clamped as was the distal subclavian.  We  were able to identify at the point of injury at the takeoff of the vertebral artery.  There was backbleeding from the vertebral artery.  Small vascular clamp was made and once proximal and distal control of the bleeding was obtained, the area could be  repaired with a single 5-0 pledgeted suture.  The bulldog on the vertebral artery was removed first.  Then, the subclavian artery distally and blood flow was returned to the subclavian artery.  The area remained hemostatic.    With this portion of the procedure done and the patient remained hemodynamically stable, we proceeded with the previously planned coronary artery bypass grafting.  Using Endo-vein harvest techniques.  The right greater saphenous vein was harvested from  the thigh and calf.  Two usable segments of vein, which were of good quality, were obtained.  Additional vein was needed and was obtained endoscopically from the left thigh.  The left internal mammary artery was dissected down as a pedicle graft.  The  distal artery was divided and had good free flow.  The pericardium had previously been opened.  The patient had been given heparin prior to clamping the subclavian artery.  Additional full dose of heparin for cardiopulmonary bypass was administered.  The  ascending aorta was cannulated.  The right atrium was cannulated.  An aortic root vent cardioplegia needle was introduced into the ascending aorta.  The patient was placed on cardiopulmonary bypass, 2.4 liters per minute per meter square.  Sites of  anastomosis were selected and dissected at the epicardium.  The patient's body temperature was cooled to 32 degrees.  Aortic crossclamp was applied; 500 mL of cold blood potassium cardioplegia was administered by with diastolic arrest of the heart.   Myocardial septal temperatures were monitored throughout the crossclamp.  We turned our  attention first to the posterior descending coronary artery.  This vessel was opened.  It was a 1.5 mm vessel and of good quality.  Using a running 7-0 Prolene,  distal anastomosis was performed.  The heart was then elevated and the first and second obtuse marginal vessels were identified.  The first obtuse marginal had a 70-80% stenosis.  Second obtuse marginal was occluded.  First obtuse marginal vessel was  opened and admitted 1.5 mm probe.  Using a diamond type side-to-side anastomosis with a running 8-0 Prolene, distal anastomosis was performed with a segment of reverse saphenous vein graft.  The distal extent of the same vein was then carried a short  distance to the second obtuse marginal.  This vessel was opened and was approximately the same size as the OM1, admitting a 1.5 mm probe.  Using a running 8-0 Prolene, distal anastomosis was performed.  We then turned our attention to the anterior wall.   The diagonal vessel was a relatively small vessel, was opened and admitted a 1 mm probe.  Using a running 8-0 Prolene, distal anastomosis with a segment of reverse saphenous vein graft was performed.  Intermittently cold blood potassium cardioplegia was  administered down the vein grafts.  Attention was then turned to the left anterior descending coronary artery.  This vessel was opened in the midportion and 1.5 mm probe  passed distally.  Using a running 8-0 Prolene, the left internal mammary artery was  anastomosed to left anterior descending coronary artery.  With removal of the bulldog on the mammary artery, there was rise in myocardial septal temperature.  The bulldog was placed back on the mammary artery.  The vessel was tacked to the epicardium.   With cross clamp still in place, 3 punch aortotomies were performed and each of the 3 vein grafts were anastomosed to the ascending aorta.  The patient was started on low dose dopamine and milrinone.  Heart was allowed to passively fill and deair.  The   bulldog on the mammary artery was removed with prompt rise in myocardial septal temperature.  Aortic crossclamp was removed with total crossclamp time of 103 minutes.  Sites and anastomoses were inspected and were free of bleeding.  The patient's body  temperature was rewarmed to 37 degrees.  He spontaneously converted to a sinus rhythm.  Sites and anastomoses were inspected and free of bleeding.  The patient was then ventilated and weaned from cardiopulmonary bypass without difficulty.  He remained  hemodynamically stable, was decannulated in the usual fashion.  Protamine sulfate was administered.  With the operative field hemostatic,  atrial and ventricular pacing wires were applied.  Graft markers were applied.  The right neck incision was  inspected and the area of repair in the vertebral artery was hemostatic and with Doppler flow in the distal subclavian and vertebral vessel, sternocleidomastoid muscle was reattached to the sternum as we had reapproximated the sternum.  Left pleural tube  was left in place.  Pericardium was loosely closed.  A Blake mediastinal drain was left in place.  Sternal wires were placed and the bone brought back together.  This allowed anatomic closure of the neck incision with interrupted 0 Vicryl, running 2-0  Vicryl in the subcutaneous tissue, running 3-0 Vicryl in the skin edges.    Sponge and needle count was reported as correct at completion of the procedure.  Dry dressings were applied.  The patient was wanded for retained sponges and none were found.  Total pump time was 142 minutes.  The patient did require 2 units of packed  red blood cells during the procedure for low hematocrit while on bypass.    He was transferred to the Surgical Intensive Care Unit for further postoperative care.    Prior to the procedure and afterwards the patient's parents were updated on the procedure and were aware of the vascular injury to the right neck secondary to placement of  Swan-Ganz catheter sheath.  AN/NUANCE  D:12/23/2017 T:12/24/2017 JOB:[id number]

## 2017-12-24 NOTE — Progress Notes (Signed)
[facility] BRIEF SICU PROGRESS NOTE  1 Day Post-Op  S/P Procedure(s) (LRB): CORONARY ARTERY BYPASS GRAFTING (CABG) x 5: Using Left Internal Mammary Artery (LIMA) and Endoscopically Harvested Right Thigh/Calf and Left Thigh Greater Saphenous Vein Grafts (SVG);   LIMA to LAD, SVG to Diag1, SVG to PDA, SVG to OM1 and OM2; / and Repair of Vertebral Laceration and Removal of Swan Ganz Introducer (N/A) TRANSESOPHAGEAL ECHOCARDIOGRAM (TEE) (N/A)   Stable day NSR w/ stable BP on low dose dopamine and Neo Breathing comfortably on 4 L/min UOP adequate  Plan: Continue current plan  Rexene Alberts, MD 12/24/2017 5:53 PM

## 2017-12-24 NOTE — Plan of Care (Signed)

## 2017-12-24 NOTE — Progress Notes (Signed)
   VASCULAR SURGERY ASSESSMENT & PLAN:   1 Day Post-Op s/p: I get him tuned up he is very Repair of right vertebral artery at the junction with subclavian artery.  Overall doing well.  SUBJECTIVE:   Some hoarseness.  PHYSICAL EXAM:   Vitals:   12/24/17 0400 12/24/17 0500 12/24/17 0600 12/24/17 0700  BP: (!) 93/57 (!) 92/54 (!) 138/56 (!) 103/58  Pulse: (!) 104 99 (!) 110 (!) 108  Resp: (!) 31 (!) 24 (!) 30 (!) 25  Temp: (!) 100.4 F (38 C) 99.9 F (37.7 C) 99.9 F (37.7 C) 99.5 F (37.5 C)  TempSrc: Other (Comment)     SpO2: 95% 91% 92% 92%  Weight:  103.6 kg    Height:       Palpable right radial pulse.  LABS:   Lab Results  Component Value Date   WBC 16.9 (H) 12/24/2017   HGB 8.6 (L) 12/24/2017   HCT 25.1 (L) 12/24/2017   MCV 86.3 12/24/2017   PLT 169 12/24/2017   CBG (last 3)  Recent Labs    12/24/17 0205 12/24/17 0300 12/24/17 0357  GLUCAP 101* 101* 97   PROBLEM LIST:    Principal Problem:   Chest pain Active Problems:   Essential hypertension   Hypokalemia   NSTEMI (non-ST elevated myocardial infarction) (HCC)   S/P CABG x 5  CURRENT MEDS:   . acetaminophen  1,000 mg Oral Q6H   Or  . acetaminophen (TYLENOL) oral liquid 160 mg/5 mL  1,000 mg Per Tube Q6H  . aspirin EC  325 mg Oral Daily   Or  . aspirin  324 mg Per Tube Daily  . atorvastatin  80 mg Oral q1800  . bisacodyl  10 mg Oral Daily   Or  . bisacodyl  10 mg Rectal Daily  . chlorhexidine  15 mL Mouth/Throat NOW  . Chlorhexidine Gluconate Cloth  6 each Topical Daily  . docusate sodium  200 mg Oral Daily  . insulin aspart  0-24 Units Subcutaneous Q4H  . mouth rinse  15 mL Mouth Rinse BID  . metoprolol tartrate  12.5 mg Oral BID   Or  . metoprolol tartrate  12.5 mg Per Tube BID  . [START ON 12/25/2017] pantoprazole  40 mg Oral Daily  . sodium chloride flush  10-40 mL Intracatheter Q12H  . sodium chloride flush  3 mL Intravenous Q12H   Paul Duncan Beeper:  [phone] Office: [phone] 12/24/2017

## 2017-12-24 NOTE — Progress Notes (Signed)
Patient ID: Paul Duncan, male   DOB: [birth year], 42 y.o.   MRN: [id number] T[facility] DAILY ICU PROGRESS NOTE                   [address]            [place] [address]          [phone]   1 Day Post-Op Procedure(s) (LRB): CORONARY ARTERY BYPASS GRAFTING (CABG) x 5: Using Left Internal Mammary Artery (LIMA) and Endoscopically Harvested Right Thigh/Calf and Left Thigh Greater Saphenous Vein Grafts (SVG);   LIMA to LAD, SVG to Diag1, SVG to PDA, SVG to OM1 and OM2; / and Repair of Vertebral Laceration and Removal of Swan Ganz Introducer (N/A) TRANSESOPHAGEAL ECHOCARDIOGRAM (TEE) (N/A)  Total Length of Stay:  LOS: 6 days   Subjective: patient awake and alert , neuro intact  Objective: Vital signs in last 24 hours: Temp:  [96.3 F (35.7 C)-100.4 F (38 C)] 99.5 F (37.5 C) (10/15 0700) Pulse Rate:  [86-110] 108 (10/15 0700) Cardiac Rhythm: Sinus tachycardia (10/15 0400) Resp:  [13-35] 25 (10/15 0700) BP: (79-138)/(46-65) 103/58 (10/15 0700) SpO2:  [91 %-99 %] 92 % (10/15 0700) Arterial Line BP: (86-117)/(44-62) 97/44 (10/15 0700) FiO2 (%):  [40 %-70 %] 40 % (10/14 2103) Weight:  [103.6 kg] 103.6 kg (10/15 0500)  Filed Weights   12/18/17 0221 12/18/17 0519 12/24/17 0500  Weight: 93 kg 94.4 kg 103.6 kg    Weight change:    Hemodynamic parameters for last 24 hours: PAP: (27-36)/(12-23) 27/12 CO:  [4.4 L/min-7.3 L/min] 6.6 L/min CI:  [2.2 L/min/m2-3.6 L/min/m2] 3.3 L/min/m2  Intake/Output from previous day: 10/14 0701 - 10/15 0700 In: 8792.5 [I.V.:6436.5; Blood:550; IV WHQPRFFMB:8466] Out: 5993 [Urine:2160; Blood:1600; Chest Tube:400]  Intake/Output this shift: No intake/output data recorded.  Current Meds: Scheduled Meds: . acetaminophen  1,000 mg Oral Q6H   Or  . acetaminophen (TYLENOL) oral liquid 160 mg/5 mL  1,000 mg Per Tube Q6H  . aspirin EC  325 mg Oral Daily   Or  . aspirin  324 mg Per Tube Daily  . atorvastatin  80 mg Oral q1800  .  bisacodyl  10 mg Oral Daily   Or  . bisacodyl  10 mg Rectal Daily  . chlorhexidine  15 mL Mouth/Throat NOW  . Chlorhexidine Gluconate Cloth  6 each Topical Daily  . docusate sodium  200 mg Oral Daily  . insulin aspart  0-24 Units Subcutaneous Q4H  . mouth rinse  15 mL Mouth Rinse BID  . metoprolol tartrate  12.5 mg Oral BID   Or  . metoprolol tartrate  12.5 mg Per Tube BID  . [START ON 12/25/2017] pantoprazole  40 mg Oral Daily  . sodium chloride flush  10-40 mL Intracatheter Q12H  . sodium chloride flush  3 mL Intravenous Q12H   Continuous Infusions: . sodium chloride 20 mL/hr at 12/24/17 0700  . sodium chloride    . sodium chloride 20 mL/hr at 12/23/17 1900  . cefUROXime (ZINACEF)  IV Stopped (12/24/17 0657)  . dexmedetomidine (PRECEDEX) IV infusion Stopped (12/23/17 2215)  . DOPamine 2 mcg/kg/min (12/24/17 0700)  . lactated ringers    . lactated ringers 20 mL/hr at 12/24/17 0700  . milrinone 0.3 mcg/kg/min (12/24/17 0700)  . nitroGLYCERIN Stopped (12/23/17 1720)  . phenylephrine (NEO-SYNEPHRINE) Adult infusion 65 mcg/min (12/24/17 0700)   PRN Meds:.sodium chloride, metoprolol tartrate, morphine injection, ondansetron (ZOFRAN) IV, oxyCODONE, sodium chloride flush, sodium chloride flush,  traMADol  General appearance: alert and cooperative Neurologic: intact Heart: regular rate and rhythm, S1, S2 normal, no murmur, click, rub or gallop Lungs: diminished breath sounds bibasilar Abdomen: soft, non-tender; bowel sounds normal; no masses,  no organomegaly Extremities: extremities normal, atraumatic, no cyanosis or edema and Homans sign is negative, no sign of DVT Wound: sternum stable  Lab Results: CBC: Recent Labs    12/23/17 2300 12/23/17 2308 12/24/17 0402  WBC 15.4*  --  16.9*  HGB 8.5* 7.5* 8.6*  HCT 25.2* 22.0* 25.1*  PLT 167  --  169   BMET:  Recent Labs    12/23/17 0208  12/23/17 2308 12/24/17 0402  NA 138   < > 139 137  K 3.5   < > 4.5 4.5  CL 104   < >  105 111  CO2 26  --   --  20*  GLUCOSE 101*   < > 132* 107*  BUN 14   < > 11 13  CREATININE 0.97   < > 0.70 0.93  CALCIUM 8.6*  --   --  7.8*   < > = values in this interval not displayed.    CMET: Lab Results  Component Value Date   WBC 16.9 (H) 12/24/2017   HGB 8.6 (L) 12/24/2017   HCT 25.1 (L) 12/24/2017   PLT 169 12/24/2017   GLUCOSE 107 (H) 12/24/2017   CHOL 224 (H) 12/18/2017   TRIG 200 (H) 12/18/2017   HDL 37 (L) 12/18/2017   LDLCALC 147 (H) 12/18/2017   ALT 20 12/22/2017   AST 21 12/22/2017   NA 137 12/24/2017   K 4.5 12/24/2017   CL 111 12/24/2017   CREATININE 0.93 12/24/2017   BUN 13 12/24/2017   CO2 20 (L) 12/24/2017   INR 1.49 12/23/2017   HGBA1C 5.3 12/18/2017      PT/INR:  Recent Labs    12/23/17 1805  LABPROT 17.9*  INR 1.49   Radiology: Dg Chest Port 1 View  Result Date: 12/23/2017 CLINICAL DATA:  42 year old male with a history of CABG EXAM: PORTABLE CHEST 1 VIEW COMPARISON:  12/18/2017 FINDINGS: Cardiomediastinal silhouette widened in the upper mediastinum, with low lung volumes and interval surgical changes of median sternotomy and CABG. Interval placement of endotracheal tube, terminating approximately 3.1 cm above the carina. Interval placement of left IJ sheath through which a Swan-Ganz catheter terminates in the right pulmonary artery. Interval placement of left pleural/mediastinal drains, with no evidence of pneumothorax. Epicardial pacing leads are in place. IMPRESSION: Early surgical changes of median sternotomy and CABG. Endotracheal tube terminates suitably above the carina, approximately 3.1 cm. Left IJ sheath transmits a Swan-Ganz catheter terminating in the right pulmonary artery. Left pleural drain and mediastinal drains in place with no pneumothorax. Epicardial pacing leads. Electronically Signed   By: Corrie Mckusick D.O.   On: 12/23/2017 17:59     Assessment/Plan: S/P Procedure(s) (LRB): CORONARY ARTERY BYPASS GRAFTING (CABG) x 5:  Using Left Internal Mammary Artery (LIMA) and Endoscopically Harvested Right Thigh/Calf and Left Thigh Greater Saphenous Vein Grafts (SVG);   LIMA to LAD, SVG to Diag1, SVG to PDA, SVG to OM1 and OM2; / and Repair of Vertebral Laceration and Removal of Swan Ganz Introducer (N/A) TRANSESOPHAGEAL ECHOCARDIOGRAM (TEE) (N/A) Mobilize Diuresis Diabetes control d/c tubes/lines See progression orders Expected Acute  Blood - loss Anemia- continue to monitor     [name] 12/24/2017 7:54 AM

## 2017-12-25 ENCOUNTER — Inpatient Hospital Stay (HOSPITAL_COMMUNITY): Payer: Managed Care, Other (non HMO)

## 2017-12-25 LAB — CBC
HCT: 21.9 % — ABNORMAL LOW (ref 39.0–52.0)
Hemoglobin: 7.5 g/dL — ABNORMAL LOW (ref 13.0–17.0)
MCH: 30.1 pg (ref 26.0–34.0)
MCHC: 34.2 g/dL (ref 30.0–36.0)
MCV: 88 fL (ref 80.0–100.0)
Platelets: 193 10*3/uL (ref 150–400)
RBC: 2.49 MIL/uL — ABNORMAL LOW (ref 4.22–5.81)
RDW: 13.1 % (ref 11.5–15.5)
WBC: 25.7 10*3/uL — ABNORMAL HIGH (ref 4.0–10.5)
nRBC: 0 % (ref 0.0–0.2)

## 2017-12-25 LAB — GLUCOSE, CAPILLARY
GLUCOSE-CAPILLARY: 124 mg/dL — AB (ref 70–99)
GLUCOSE-CAPILLARY: 135 mg/dL — AB (ref 70–99)
GLUCOSE-CAPILLARY: 179 mg/dL — AB (ref 70–99)
Glucose-Capillary: 104 mg/dL — ABNORMAL HIGH (ref 70–99)
Glucose-Capillary: 115 mg/dL — ABNORMAL HIGH (ref 70–99)
Glucose-Capillary: 131 mg/dL — ABNORMAL HIGH (ref 70–99)
Glucose-Capillary: 145 mg/dL — ABNORMAL HIGH (ref 70–99)

## 2017-12-25 LAB — BASIC METABOLIC PANEL
Anion gap: 6 (ref 5–15)
BUN: 18 mg/dL (ref 6–20)
CO2: 22 mmol/L (ref 22–32)
Calcium: 7.9 mg/dL — ABNORMAL LOW (ref 8.9–10.3)
Chloride: 108 mmol/L (ref 98–111)
Creatinine, Ser: 1 mg/dL (ref 0.61–1.24)
GFR calc Af Amer: >60 mL/min (ref >60)
GFR calc non Af Amer: >60 mL/min (ref >60)
Glucose, Bld: 131 mg/dL — ABNORMAL HIGH (ref 70–99)
Potassium: 4.3 mmol/L (ref 3.5–5.1)
Sodium: 136 mmol/L (ref 135–145)

## 2017-12-25 LAB — PREPARE RBC (CROSSMATCH)

## 2017-12-25 MED ORDER — FUROSEMIDE 10 MG/ML IJ SOLN
40.0000 mg | Freq: Once | INTRAMUSCULAR | Status: AC
  Administered 2017-12-25: 40 mg via INTRAVENOUS
  Filled 2017-12-25: qty 4

## 2017-12-25 MED ORDER — INSULIN ASPART 100 UNIT/ML ~~LOC~~ SOLN
0.0000 [IU] | SUBCUTANEOUS | Status: DC
  Administered 2017-12-27: 2 [IU] via SUBCUTANEOUS

## 2017-12-25 MED ORDER — FUROSEMIDE 10 MG/ML IJ SOLN: 20.0000 mg | Freq: Once | INTRAMUSCULAR | Status: DC

## 2017-12-25 MED ORDER — SODIUM CHLORIDE 0.9% IV SOLUTION
Freq: Once | INTRAVENOUS | Status: AC
  Administered 2017-12-25: 10 mL/h via INTRAVENOUS

## 2017-12-25 NOTE — Plan of Care (Signed)
  Problem: Education: Goal: Knowledge of General Education information will improve Description Including pain rating scale, medication(s)/side effects and non-pharmacologic comfort measures Outcome: Progressing   Problem: Health Behavior/Discharge Planning: Goal: Ability to manage health-related needs will improve Outcome: Progressing   Problem: Clinical Measurements: Goal: Ability to maintain clinical measurements within normal limits will improve Outcome: Progressing Goal: Will remain free from infection Outcome: Progressing Goal: Diagnostic test results will improve Outcome: Progressing Goal: Respiratory complications will improve Outcome: Progressing Goal: Cardiovascular complication will be avoided Outcome: Progressing   Problem: Activity: Goal: Risk for activity intolerance will decrease Outcome: Progressing   Problem: Nutrition: Goal: Adequate nutrition will be maintained Outcome: Progressing   Problem: Coping: Goal: Level of anxiety will decrease Outcome: Progressing   Problem: Elimination: Goal: Will not experience complications related to bowel motility Outcome: Progressing Goal: Will not experience complications related to urinary retention Outcome: Progressing   Problem: Pain Managment: Goal: General experience of comfort will improve Outcome: Progressing   Problem: Skin Integrity: Goal: Risk for impaired skin integrity will decrease Outcome: Progressing   Problem: Education: Goal: Understanding of CV disease, CV risk reduction, and recovery process will improve Outcome: Progressing Goal: Individualized Educational Video(s) Outcome: Progressing   Problem: Activity: Goal: Ability to return to baseline activity level will improve Outcome: Progressing   Problem: Cardiovascular: Goal: Ability to achieve and maintain adequate cardiovascular perfusion will improve Outcome: Progressing Goal: Vascular access site(s) Level 0-1 will be  maintained Outcome: Progressing   Problem: Health Behavior/Discharge Planning: Goal: Ability to safely manage health-related needs after discharge will improve Outcome: Progressing   Problem: Education: Goal: Will demonstrate proper wound care and an understanding of methods to prevent future damage Outcome: Progressing Goal: Knowledge of disease or condition will improve Outcome: Progressing Goal: Knowledge of the prescribed therapeutic regimen will improve Outcome: Progressing Goal: Individualized Educational Video(s) Outcome: Progressing   Problem: Activity: Goal: Risk for activity intolerance will decrease Outcome: Progressing   Problem: Cardiac: Goal: Will achieve and/or maintain hemodynamic stability Outcome: Progressing   Problem: Clinical Measurements: Goal: Postoperative complications will be avoided or minimized Outcome: Progressing   Problem: Respiratory: Goal: Respiratory status will improve Outcome: Progressing   Problem: Skin Integrity: Goal: Wound healing without signs and symptoms of infection Outcome: Progressing Goal: Risk for impaired skin integrity will decrease Outcome: Progressing   Problem: Urinary Elimination: Goal: Ability to achieve and maintain adequate renal perfusion and functioning will improve Outcome: Progressing

## 2017-12-25 NOTE — Progress Notes (Signed)
Patient ID: Paul Duncan, male   DOB: Jun 02, 1975, 42 y.o.   MRN: [id number] T[facility] DAILY ICU PROGRESS NOTE                   [address]            [place] [address]          [phone]   2 Days Post-Op Procedure(s) (LRB): CORONARY ARTERY BYPASS GRAFTING (CABG) x 5: Using Left Internal Mammary Artery (LIMA) and Endoscopically Harvested Right Thigh/Calf and Left Thigh Greater Saphenous Vein Grafts (SVG);   LIMA to LAD, SVG to Diag1, SVG to PDA, SVG to OM1 and OM2; / and Repair of Vertebral Laceration and Removal of Swan Ganz Introducer (N/A) TRANSESOPHAGEAL ECHOCARDIOGRAM (TEE) (N/A)  Total Length of Stay:  LOS: 7 days   Subjective: Patient up to chair, better respiratory effort  Objective: Vital signs in last 24 hours: Temp:  [97.7 F (36.5 C)-99.2 F (37.3 C)] 98.5 F (36.9 C) (10/16 0430) Pulse Rate:  [89-118] 116 (10/16 0700) Cardiac Rhythm: Normal sinus rhythm (10/16 0000) Resp:  [19-35] 23 (10/16 0700) BP: (101-144)/(53-93) 116/69 (10/16 0700) SpO2:  [90 %-97 %] 93 % (10/16 0700) Arterial Line BP: (74-137)/(50-74) 99/52 (10/16 0700) Weight:  [103.2 kg] 103.2 kg (10/16 0500)  Filed Weights   12/18/17 0519 12/24/17 0500 12/25/17 0500  Weight: 94.4 kg 103.6 kg 103.2 kg    Weight change: -0.4 kg   Hemodynamic parameters for last 24 hours:    Intake/Output from previous day: 10/15 0701 - 10/16 0700 In: 1935.1 [P.O.:340; I.V.:1495.1; IV Piggyback:100] Out: 4665 [Urine:1225; Chest Tube:320]  Intake/Output this shift: No intake/output data recorded.  Current Meds: Scheduled Meds: . acetaminophen  1,000 mg Oral Q6H   Or  . acetaminophen (TYLENOL) oral liquid 160 mg/5 mL  1,000 mg Per Tube Q6H  . aspirin EC  325 mg Oral Daily   Or  . aspirin  324 mg Per Tube Daily  . atorvastatin  80 mg Oral q1800  . bisacodyl  10 mg Oral Daily   Or  . bisacodyl  10 mg Rectal Daily  . Chlorhexidine Gluconate Cloth  6 each Topical Daily  . docusate sodium   200 mg Oral Daily  . enoxaparin (LOVENOX) injection  30 mg Subcutaneous QHS  . insulin aspart  0-24 Units Subcutaneous Q4H  . mouth rinse  15 mL Mouth Rinse BID  . metoprolol tartrate  12.5 mg Oral BID   Or  . metoprolol tartrate  12.5 mg Per Tube BID  . pantoprazole  40 mg Oral Daily  . sodium chloride flush  10-40 mL Intracatheter Q12H  . sodium chloride flush  3 mL Intravenous Q12H   Continuous Infusions: . sodium chloride Stopped (12/24/17 0829)  . sodium chloride    . sodium chloride 20 mL/hr at 12/24/17 2200  . dexmedetomidine (PRECEDEX) IV infusion Stopped (12/23/17 2215)  . DOPamine Stopped (12/24/17 1916)  . lactated ringers    . lactated ringers Stopped (12/25/17 0553)  . nitroGLYCERIN Stopped (12/23/17 1720)  . phenylephrine (NEO-SYNEPHRINE) Adult infusion Stopped (12/25/17 0446)   PRN Meds:.sodium chloride, metoprolol tartrate, morphine injection, ondansetron (ZOFRAN) IV, oxyCODONE, sodium chloride flush, sodium chloride flush, traMADol  General appearance: alert, cooperative and no distress Neurologic: intact Heart: regular rate and rhythm, S1, S2 normal, no murmur, click, rub or gallop Lungs: diminished breath sounds bibasilar Abdomen: soft, non-tender; bowel sounds normal; no masses,  no organomegaly Extremities: extremities normal, atraumatic, no cyanosis  or edema and Homans sign is negative, no sign of DVT Wound: Sternum stable  Lab Results: CBC: Recent Labs    12/24/17 1722 12/24/17 1731 12/25/17 0300  WBC 24.2*  --  25.7*  HGB 8.5* 8.2* 7.5*  HCT 24.4* 24.0* 21.9*  PLT 201  --  193   BMET:  Recent Labs    12/24/17 0402  12/24/17 1731 12/25/17 0300  NA 137  --  136 136  K 4.5  --  4.4 4.3  CL 111  --  102 108  CO2 20*  --   --  22  GLUCOSE 107*  --  176* 131*  BUN 13  --  17 18  CREATININE 0.93   < > 1.00 1.00  CALCIUM 7.8*  --   --  7.9*   < > = values in this interval not displayed.    CMET: Lab Results  Component Value Date   WBC  25.7 (H) 12/25/2017   HGB 7.5 (L) 12/25/2017   HCT 21.9 (L) 12/25/2017   PLT 193 12/25/2017   GLUCOSE 131 (H) 12/25/2017   CHOL 224 (H) 12/18/2017   TRIG 200 (H) 12/18/2017   HDL 37 (L) 12/18/2017   LDLCALC 147 (H) 12/18/2017   ALT 20 12/22/2017   AST 21 12/22/2017   NA 136 12/25/2017   K 4.3 12/25/2017   CL 108 12/25/2017   CREATININE 1.00 12/25/2017   BUN 18 12/25/2017   CO2 22 12/25/2017   INR 1.49 12/23/2017   HGBA1C 5.3 12/18/2017      PT/INR:  Recent Labs    12/23/17 1805  LABPROT 17.9*  INR 1.49   Radiology: No results found.   Assessment/Plan: S/P Procedure(s) (LRB): CORONARY ARTERY BYPASS GRAFTING (CABG) x 5: Using Left Internal Mammary Artery (LIMA) and Endoscopically Harvested Right Thigh/Calf and Left Thigh Greater Saphenous Vein Grafts (SVG);   LIMA to LAD, SVG to Diag1, SVG to PDA, SVG to OM1 and OM2; / and Repair of Vertebral Laceration and Removal of Swan Ganz Introducer (N/A) TRANSESOPHAGEAL ECHOCARDIOGRAM (TEE) (N/A) Mobilize Diuresis Diabetes control Expected Acute  Blood - loss Anemia- continue to monitor  With continued drop in hemoglobin no obvious blood loss, likely redistribution from surgery will transfuse 1 additional unit Renal function stable   [name] 12/25/2017 7:16 AM

## 2017-12-25 NOTE — Progress Notes (Signed)
Patient ID: Paul Duncan, male   DOB: [birth year], 42 y.o.   MRN: [id number] EVENING ROUNDS NOTE :     [address]       [place], [address]             [phone]                 2 Days Post-Op Procedure(s) (LRB): CORONARY ARTERY BYPASS GRAFTING (CABG) x 5: Using Left Internal Mammary Artery (LIMA) and Endoscopically Harvested Right Thigh/Calf and Left Thigh Greater Saphenous Vein Grafts (SVG);   LIMA to LAD, SVG to Diag1, SVG to PDA, SVG to OM1 and OM2; / and Repair of Vertebral Laceration and Removal of Swan Ganz Introducer (N/A) TRANSESOPHAGEAL ECHOCARDIOGRAM (TEE) (N/A)  Total Length of Stay:  LOS: 7 days  BP 106/68   Pulse (!) 115   Temp 99.2 F (37.3 C) (Oral)   Resp (!) 25   Ht 5\' 8"  (1.727 m)   Wt 103.2 kg   SpO2 (!) 73%   BMI 34.59 kg/m   .Intake/Output      10/16 0701 - 10/17 0700   P.O. 860   I.V. (mL/kg) 191.9 (1.9)   Blood 315   IV Piggyback 100   Total Intake(mL/kg) 1466.9 (14.2)   Urine (mL/kg/hr) 1150 (0.9)   Chest Tube 100   Total Output 1250   Net +216.9         . sodium chloride Stopped (12/24/17 0829)  . sodium chloride    . sodium chloride 20 mL/hr at 12/24/17 2200  . dexmedetomidine (PRECEDEX) IV infusion Stopped (12/23/17 2215)  . DOPamine Stopped (12/24/17 1916)  . lactated ringers    . lactated ringers Stopped (12/25/17 1629)  . nitroGLYCERIN Stopped (12/23/17 1720)  . phenylephrine (NEO-SYNEPHRINE) Adult infusion Stopped (12/25/17 0446)     Lab Results  Component Value Date   WBC 25.7 (H) 12/25/2017   HGB 7.5 (L) 12/25/2017   HCT 21.9 (L) 12/25/2017   PLT 193 12/25/2017   GLUCOSE 131 (H) 12/25/2017   CHOL 224 (H) 12/18/2017   TRIG 200 (H) 12/18/2017   HDL 37 (L) 12/18/2017   LDLCALC 147 (H) 12/18/2017   ALT 20 12/22/2017   AST 21 12/22/2017   NA 136 12/25/2017   K 4.3 12/25/2017   CL 108 12/25/2017   CREATININE 1.00 12/25/2017   BUN 18 12/25/2017   CO2 22 12/25/2017   INR 1.49 12/23/2017   HGBA1C  5.3 12/18/2017   Fees better after prbc today Walked around unit  good diuresis from lasix    [name] MD  [place] [phone] Office [phone] 12/25/2017 7:25 PM

## 2017-12-26 ENCOUNTER — Inpatient Hospital Stay (HOSPITAL_COMMUNITY): Payer: Managed Care, Other (non HMO)

## 2017-12-26 LAB — CBC
HCT: 24.9 % — ABNORMAL LOW (ref 39.0–52.0)
Hemoglobin: 8.1 g/dL — ABNORMAL LOW (ref 13.0–17.0)
MCH: 28.9 pg (ref 26.0–34.0)
MCHC: 32.5 g/dL (ref 30.0–36.0)
MCV: 88.9 fL (ref 80.0–100.0)
Platelets: 232 10*3/uL (ref 150–400)
RBC: 2.8 MIL/uL — ABNORMAL LOW (ref 4.22–5.81)
RDW: 13.2 % (ref 11.5–15.5)
WBC: 23.7 10*3/uL — ABNORMAL HIGH (ref 4.0–10.5)
nRBC: 0.2 % (ref 0.0–0.2)

## 2017-12-26 LAB — BPAM RBC
Blood Product Expiration Date: 201911052359
Blood Product Expiration Date: 201911062359
Blood Product Expiration Date: 201911112359
Blood Product Expiration Date: 201911112359
Blood Product Expiration Date: 201911112359
Blood Product Expiration Date: 201911112359
ISSUE DATE / TIME: 201910140946
ISSUE DATE / TIME: 201910140946
ISSUE DATE / TIME: 201910140946
ISSUE DATE / TIME: 201910160904
Unit Type and Rh: 5100
Unit Type and Rh: 5100
Unit Type and Rh: 5100
Unit Type and Rh: 5100
Unit Type and Rh: 5100
Unit Type and Rh: 5100

## 2017-12-26 LAB — TYPE AND SCREEN
ABO/RH(D): O POS
Antibody Screen: NEGATIVE
Unit division: 0
Unit division: 0
Unit division: 0
Unit division: 0
Unit division: 0
Unit division: 0

## 2017-12-26 LAB — GLUCOSE, CAPILLARY
GLUCOSE-CAPILLARY: 115 mg/dL — AB (ref 70–99)
GLUCOSE-CAPILLARY: 107 mg/dL — AB (ref 70–99)
Glucose-Capillary: 51 mg/dL — ABNORMAL LOW (ref 70–99)
Glucose-Capillary: 101 mg/dL — ABNORMAL HIGH (ref 70–99)
Glucose-Capillary: 106 mg/dL — ABNORMAL HIGH (ref 70–99)

## 2017-12-26 LAB — BASIC METABOLIC PANEL
Anion gap: 11 (ref 5–15)
BUN: 19 mg/dL (ref 6–20)
CO2: 23 mmol/L (ref 22–32)
Calcium: 8 mg/dL — ABNORMAL LOW (ref 8.9–10.3)
Chloride: 101 mmol/L (ref 98–111)
Creatinine, Ser: 0.91 mg/dL (ref 0.61–1.24)
GFR calc Af Amer: >60 mL/min (ref >60)
GFR calc non Af Amer: >60 mL/min (ref >60)
Glucose, Bld: 116 mg/dL — ABNORMAL HIGH (ref 70–99)
Potassium: 4 mmol/L (ref 3.5–5.1)
Sodium: 135 mmol/L (ref 135–145)

## 2017-12-26 MED ORDER — FUROSEMIDE 10 MG/ML IJ SOLN
40.0000 mg | Freq: Once | INTRAMUSCULAR | Status: AC
  Administered 2017-12-26: 40 mg via INTRAVENOUS
  Filled 2017-12-26: qty 4

## 2017-12-26 MED ORDER — METOPROLOL TARTRATE 25 MG/10 ML ORAL SUSPENSION: 12.5000 mg | Freq: Two times a day (BID) | ORAL | Status: DC

## 2017-12-26 MED ORDER — METOPROLOL TARTRATE 25 MG PO TABS
25.0000 mg | ORAL_TABLET | Freq: Two times a day (BID) | ORAL | Status: DC
  Administered 2017-12-26 – 2017-12-27 (×4): 25 mg via ORAL
  Filled 2017-12-26 (×4): qty 1

## 2017-12-26 MED FILL — Heparin Sodium (Porcine) Inj 1000 Unit/ML: INTRAMUSCULAR | Qty: 20 | Status: AC

## 2017-12-26 MED FILL — Sodium Bicarbonate IV Soln 8.4%: INTRAVENOUS | Qty: 50 | Status: AC

## 2017-12-26 MED FILL — Mannitol IV Soln 20%: INTRAVENOUS | Qty: 500 | Status: AC

## 2017-12-26 MED FILL — Calcium Chloride Inj 10%: INTRAVENOUS | Qty: 10 | Status: AC

## 2017-12-26 MED FILL — Electrolyte-R (PH 7.4) Solution: INTRAVENOUS | Qty: 5000 | Status: AC

## 2017-12-26 MED FILL — Lidocaine HCl(Cardiac) IV PF Soln Pref Syr 100 MG/5ML (2%): INTRAVENOUS | Qty: 5 | Status: AC

## 2017-12-26 MED FILL — Sodium Chloride IV Soln 0.9%: INTRAVENOUS | Qty: 2000 | Status: AC

## 2017-12-26 NOTE — Discharge Summary (Signed)
Physician Discharge Summary  Patient ID: Paul Duncan MRN: [id number] DOB/AGE: 1976/02/03 42 y.o.  Admit date: 12/18/2017 Discharge date: 01/01/2018  Admission Diagnoses: Patient Active Problem List   Diagnosis Date Noted  . Chest pain 12/18/2017  . Essential hypertension 12/18/2017  . Hypokalemia 12/18/2017  . NSTEMI (non-ST elevated myocardial infarction) [facility])    Discharge Diagnoses:  Principal Problem:   Chest pain Active Problems:   Essential hypertension   Hypokalemia   NSTEMI (non-ST elevated myocardial infarction) (HCC)   S/P CABG x 5   Discharged Condition: good  HPI:   The patient is a 42 year old male who presented to the emergency room on 12/18/2017 with complaints of chest pain.  The pain was primarily retrosternal without radiation and was also associated with some shortness of breath.  Is persisted for several hours.  There was no nausea vomiting, diaphoresis or symptoms of syncope/presyncope.  He called EMS and was brought to the emergency room where the pain was initially relieved with sublingual nitroglycerin.  He ruled in for non-ST segment elevation myocardial infarction and was started on heparin.  Initial troponin was less than 0.30 and peaked at 5.00.    He has a history of hypertension, as well as a family history of early coronary artery disease.  He also has a history of brain tumor resection and previous seizures in 2007 at [facility] followed with yearly MRI.  He does not smoke cigarettes.  Hemoglobin A1c is noted to be 5.3 and has a history of prediabetes.  His lipid profile reveals a very poor triglyceride to HDL ratio at 200/37.  His LDL C is 147.  Cardiac catheterization was performed today and reveals severe multivessel coronary artery disease.    Patient currently on heparin and ntg, with out chest pain. He denies history of previous chest pain or symptoms of CHF.   Hospital Course:   On 12/23/2017 Paul Duncan underwent a CABG x 5 and  exploration of the neck, control of subclavian and vertebral artery and repair of the vertebral artery with Dr. Servando Snare and Dr. Scot Dock.  Postop day 1 he continued to progress.  We discontinued his chest tubes and lines.  We began to mobilize the patient.  Postop day 2 we transfuse 1 unit of packed red blood cells for a drop in hemoglobin.  His renal function remained stable.  We continue tight glycemic control.  Postop day 3 he remained on 4 L nasal cannula for oxygen support.  We encourage incentive spirometry use.  We encourage ambulation in the halls.  We diurese the patient with IV Lasix and there was a good urine output resolved.  His chest x-ray remains stable however his lungs remained hypoinflated with bilateral atelectasis.  We continue to encourage aggressive pulmonary toilet. On the floor he continued to progress. We removed his pacing wires on 12/28/2017. His rhythm remained stable. He remained fluid overloaded, therefore we continued his lasix. He is now down to his baseline weight. His lower extremity is much less edematous. He is walking in the halls with limited assistance, tolerating room air, his incision is healing well and he is ready for home.   Consults: vascular surgery  Significant Diagnostic Studies:   CLINICAL DATA:  Status post CABG 2 days ago.  EXAM: PORTABLE CHEST 1 VIEW  COMPARISON:  Portable chest x-ray of December 24, 2017  FINDINGS: The right lung remains hypoinflated. The left lung is better inflated. Retrocardiac density on the left has decreased. There is no large left  pleural effusion and no left pneumothorax. The cardiac silhouette is enlarged. The pulmonary vascularity is not engorged. The sternal wires are intact. The Swan-Ganz catheter is been removed. The left internal jugular Cordis sheath remains.  IMPRESSION: Decreased left lower lobe atelectasis. No more than a trace of pleural fluid is present on the left. Persistent hypoinflation of the  right lung. Persistent enlargement the cardiac silhouette without pulmonary vascular congestion. The remaining support tubes are in reasonable position.   Electronically Signed   By: Paul  Duncan M.D.   On: 12/25/2017 10:19    Treatments:  NAME: Paul Duncan MEDICAL RECORD [id number] ACCOUNT [id number] DATE OF BIRTH:September 29, 1975 FACILITY: [facility] LOCATION: [facility] PHYSICIAN:Paul Maryruth Bun, MD  OPERATIVE REPORT  DATE OF PROCEDURE:  12/23/2017  PREOPERATIVE DIAGNOSES:   1.  Recent anterior myocardial infarction.   2.  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  POSTOPERATIVE DIAGNOSES:   1.  Recent anterior myocardial infarction.   2.  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 3.  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  PROCEDURES PERFORMED:   1.  Exploration of right neck, control of subclavian artery and vertebral artery and repair of the vertebral artery. 2.  Coronary artery bypass grafting x5 with the left internal mammary to the left anterior descending coronary artery, reverse saphenous vein graft to the diagonal coronary artery, sequential reverse saphenous vein graft to the first and second obtuse  marginal, reverse saphenous vein graft to the posterior descending coronary artery with right thigh and calf greater saphenous endoscopic vein harvesting and left thigh endoscopic vein harvesting of the left greater saphenous vein.  SURGEON:  Paul Bal, MD  FIRST ASSISTANT:  Paul Duncan, [place].    Discharge Exam: Blood pressure (!) 122/56, pulse 85, temperature 98.6 F (37 C), temperature source Oral, resp. rate 20, height 5\' 8"  (1.727 m), weight 94.6 kg, SpO2 94 %.   General appearance: alert, cooperative and no distress Heart: regular rate and rhythm, S1, S2 normal, no murmur,  click, rub or gallop Lungs: clear to auscultation bilaterally Abdomen: soft, non-tender; bowel sounds normal; no masses,  no organomegaly Extremities: extremities normal, atraumatic, no cyanosis or edema Wound: clean and dry  Disposition: Discharge disposition: 01-Home or Self Care       Discharge Instructions    Amb Referral to Cardiac Rehabilitation   Complete by:  As directed    Diagnosis:  CABG   CABG X ___:  5     Allergies as of 01/01/2018   No Known Allergies     Medication List    STOP taking these medications   amLODipine 10 MG tablet Commonly known as:  NORVASC   aspirin 81 MG tablet Replaced by:  aspirin 325 MG EC tablet   cephALEXin 500 MG capsule Commonly known as:  KEFLEX   hydrochlorothiazide 25 MG tablet Commonly known as:  HYDRODIURIL   mupirocin ointment 2 % Commonly known as:  BACTROBAN     TAKE these medications   aspirin 325 MG EC tablet Take 1 tablet (325 mg total) by mouth daily. Replaces:  aspirin 81 MG tablet   atorvastatin 80 MG tablet Commonly known as:  LIPITOR Take 1 tablet (80 mg total) by mouth daily at 6 PM.   furosemide 40 MG tablet Commonly known as:  LASIX Take one tab (40mg ) daily with your potassium supplementation. If more lower leg swelling may  take an extra 20mg  (0.5 tab) per day until swelling subsides.   metoprolol tartrate 50 MG tablet Commonly known as:  LOPRESSOR Take 1 tablet (50 mg total) by mouth 2 (two) times daily.   oxyCODONE 5 MG immediate release tablet Commonly known as:  Oxy IR/ROXICODONE Take 1-2 tablets (5-10 mg total) by mouth every 3 (three) hours as needed for severe pain.   potassium chloride SA 20 MEQ tablet Commonly known as:  K-DUR,KLOR-CON Take 1 tablet (20 mEq total) by mouth daily.      Follow-up Information    Almyra Deforest, [place] Follow up on 01/10/2018.   Specialties:  Cardiology, Radiology Why:  Please arrive 15 minutes early for your 9:30am post-hospital cardiology follow-up  appointment Contact information: [address] [place] [address] [phone]        [name], [place]. Call in 1 day(s).   Specialty:  Family Medicine Contact information: [place] [address] [phone]        [name], MD Follow up.   Specialty:  Cardiothoracic Surgery Why:  Your routine follow-up appointment is on 01/23/2018 at 9:00am. Please arrive at 8:30am for a chest xray located at [facility] [place] which is on the first floor of our building.  Contact information: [place] [address] [phone]          The patient has been discharged on:   1.Beta Blocker:  Yes [ yes  ]                              No   [   ]                              If No, reason:  2.Ace Inhibitor/ARB: Yes [   ]                                     No  [ no ]                                     If No, reason: titration of BB  3.Statin:   Yes [ yes  ]                  No  [   ]                  If No, reason:  4.Ecasa:  Yes  [ yes  ]                  No   [   ]                  If No, reason:   Signed: Elgie Collard 01/01/2018, 10:07 AM

## 2017-12-26 NOTE — Progress Notes (Signed)
      [address]       [place] [address]             [phone]    POD # 3  Resting comfortably  BP 125/71   Pulse (!) 110   Temp 99.3 F (37.4 C) (Oral)   Resp (!) 29   Ht 5\' 8"  (1.727 m)   Wt 102.6 kg   SpO2 96%   BMI 34.39 kg/m   Intake/Output Summary (Last 24 hours) at 12/26/2017 2104 Last data filed at 12/26/2017 1100 Gross per 24 hour  Intake 480 ml  Output 1076 ml  Net -596 ml   CBG low earlier, OK this PM  Laury Huizar C. Roxan Hockey, MD [facility] [phone]

## 2017-12-26 NOTE — Progress Notes (Addendum)
TCTS DAILY ICU PROGRESS NOTE                   [address] [place]            [place] [address]          [phone]   3 Days Post-Op Procedure(s) (LRB): CORONARY ARTERY BYPASS GRAFTING (CABG) x 5: Using Left Internal Mammary Artery (LIMA) and Endoscopically Harvested Right Thigh/Calf and Left Thigh Greater Saphenous Vein Grafts (SVG);   LIMA to LAD, SVG to Diag1, SVG to PDA, SVG to OM1 and OM2; / and Repair of Vertebral Laceration and Removal of Swan Ganz Introducer (N/A) TRANSESOPHAGEAL ECHOCARDIOGRAM (TEE) (N/A)  Total Length of Stay:  LOS: 8 days   Subjective: Feels okay today.   Objective: Vital signs in last 24 hours: Temp:  [98.2 F (36.8 C)-99.6 F (37.6 C)] 99.6 F (37.6 C) (10/17 0400) Pulse Rate:  [97-123] 116 (10/17 0500) Cardiac Rhythm: Sinus tachycardia (10/17 0400) Resp:  [8-33] 20 (10/17 0500) BP: (106-140)/(65-105) 115/65 (10/17 0500) SpO2:  [88 %-100 %] 94 % (10/17 0500) Arterial Line BP: (106-121)/(55-58) 110/57 (10/16 0930) Weight:  [102.6 kg] 102.6 kg (10/17 0500)  Filed Weights   12/24/17 0500 12/25/17 0500 12/26/17 0500  Weight: 103.6 kg 103.2 kg 102.6 kg    Weight change: -0.6 kg   Hemodynamic parameters for last 24 hours:    Intake/Output from previous day: 10/16 0701 - 10/17 0700 In: 1586.9 [P.O.:980; I.V.:191.9; Blood:315; IV Piggyback:100] Out: 1875 [Urine:1775; Chest Tube:100]  Intake/Output this shift: No intake/output data recorded.  Current Meds: Scheduled Meds: . acetaminophen  1,000 mg Oral Q6H   Or  . acetaminophen (TYLENOL) oral liquid 160 mg/5 mL  1,000 mg Per Tube Q6H  . aspirin EC  325 mg Oral Daily   Or  . aspirin  324 mg Per Tube Daily  . atorvastatin  80 mg Oral q1800  . bisacodyl  10 mg Oral Daily   Or  . bisacodyl  10 mg Rectal Daily  . Chlorhexidine Gluconate Cloth  6 each Topical Daily  . docusate sodium  200 mg Oral Daily  . enoxaparin (LOVENOX) injection  30 mg Subcutaneous QHS  . insulin  aspart  0-24 Units Subcutaneous Q4H  . mouth rinse  15 mL Mouth Rinse BID  . metoprolol tartrate  12.5 mg Oral BID   Or  . metoprolol tartrate  12.5 mg Per Tube BID  . pantoprazole  40 mg Oral Daily  . sodium chloride flush  10-40 mL Intracatheter Q12H  . sodium chloride flush  3 mL Intravenous Q12H   Continuous Infusions: . sodium chloride Stopped (12/24/17 0829)  . sodium chloride    . sodium chloride 20 mL/hr at 12/24/17 2200  . dexmedetomidine (PRECEDEX) IV infusion Stopped (12/23/17 2215)  . DOPamine Stopped (12/24/17 1916)  . lactated ringers    . lactated ringers Stopped (12/25/17 1629)  . nitroGLYCERIN Stopped (12/23/17 1720)  . phenylephrine (NEO-SYNEPHRINE) Adult infusion Stopped (12/25/17 0446)   PRN Meds:.sodium chloride, metoprolol tartrate, morphine injection, ondansetron (ZOFRAN) IV, oxyCODONE, sodium chloride flush, sodium chloride flush, traMADol  General appearance: alert, cooperative and no distress Heart: sinus tachycardia Lungs: clear to auscultation bilaterally and diminished in the lower lobes bilaterally Abdomen: soft, non-tender; bowel sounds normal; no masses,  no organomegaly Extremities: extremities normal, atraumatic, no cyanosis or edema Wound: clean and dry, covered with sterile dressing  Lab Results: CBC: Recent Labs    12/25/17 0300 12/26/17 0423  WBC  25.7* 23.7*  HGB 7.5* 8.1*  HCT 21.9* 24.9*  PLT 193 232   BMET:  Recent Labs    12/25/17 0300 12/26/17 0423  NA 136 135  K 4.3 4.0  CL 108 101  CO2 22 23  GLUCOSE 131* 116*  BUN 18 19  CREATININE 1.00 0.91  CALCIUM 7.9* 8.0*    CMET: Lab Results  Component Value Date   WBC 23.7 (H) 12/26/2017   HGB 8.1 (L) 12/26/2017   HCT 24.9 (L) 12/26/2017   PLT 232 12/26/2017   GLUCOSE 116 (H) 12/26/2017   CHOL 224 (H) 12/18/2017   TRIG 200 (H) 12/18/2017   HDL 37 (L) 12/18/2017   LDLCALC 147 (H) 12/18/2017   ALT 20 12/22/2017   AST 21 12/22/2017   NA 135 12/26/2017   K 4.0  12/26/2017   CL 101 12/26/2017   CREATININE 0.91 12/26/2017   BUN 19 12/26/2017   CO2 23 12/26/2017   INR 1.49 12/23/2017   HGBA1C 5.3 12/18/2017      PT/INR:  Recent Labs    12/23/17 1805  LABPROT 17.9*  INR 1.49   Radiology: No results found.   Assessment/Plan: S/P Procedure(s) (LRB): CORONARY ARTERY BYPASS GRAFTING (CABG) x 5: Using Left Internal Mammary Artery (LIMA) and Endoscopically Harvested Right Thigh/Calf and Left Thigh Greater Saphenous Vein Grafts (SVG);   LIMA to LAD, SVG to Diag1, SVG to PDA, SVG to OM1 and OM2; / and Repair of Vertebral Laceration and Removal of Swan Ganz Introducer (N/A) TRANSESOPHAGEAL ECHOCARDIOGRAM (TEE) (N/A)   1. Sinus tachycardia this morning. BP well controlled. Will increase metoprolol to 25mg  BID.  2. Remains on 4L [place]. Will decrease to 3L this morning. Watch oxygen saturation. Continue to use incentive spirometer. CXR remains stable today. Lungs remain hypoinflated with atelectasis.  3. Renal-creatinine 0.91, electrolytes okay. On IV lasix x 1-making good urine 4. H and H 8.1/24.9, platelets 232k, expected acute blood loss anemia 5. Blood glucose well controlled on current regimen  Plan: lines and tubes removed.Keep in ICU due to respiratory status. Progressing well. Encouraged ambulation and use of incentive spirometer.     Elgie Collard 12/26/2017 7:27 AM   H/h stablized Walking Better Continue diuresis I have seen and examined Paul Duncan and agree with the above assessment  and plan.  [name] MD Beeper [phone] Office [phone] 12/26/2017 2:34 PM

## 2017-12-27 ENCOUNTER — Inpatient Hospital Stay (HOSPITAL_COMMUNITY): Payer: Managed Care, Other (non HMO)

## 2017-12-27 LAB — CBC
HCT: 24.4 % — ABNORMAL LOW (ref 39.0–52.0)
Hemoglobin: 7.9 g/dL — ABNORMAL LOW (ref 13.0–17.0)
MCH: 29.3 pg (ref 26.0–34.0)
MCHC: 32.4 g/dL (ref 30.0–36.0)
MCV: 90.4 fL (ref 80.0–100.0)
Platelets: 327 10*3/uL (ref 150–400)
RBC: 2.7 MIL/uL — ABNORMAL LOW (ref 4.22–5.81)
RDW: 13.6 % (ref 11.5–15.5)
WBC: 19.5 10*3/uL — ABNORMAL HIGH (ref 4.0–10.5)
nRBC: 0.2 % (ref 0.0–0.2)

## 2017-12-27 LAB — GLUCOSE, CAPILLARY
GLUCOSE-CAPILLARY: 145 mg/dL — AB (ref 70–99)
GLUCOSE-CAPILLARY: 97 mg/dL (ref 70–99)
Glucose-Capillary: 116 mg/dL — ABNORMAL HIGH (ref 70–99)
Glucose-Capillary: 91 mg/dL (ref 70–99)
Glucose-Capillary: 76 mg/dL (ref 70–99)
Glucose-Capillary: 80 mg/dL (ref 70–99)
Glucose-Capillary: 91 mg/dL (ref 70–99)

## 2017-12-27 LAB — BASIC METABOLIC PANEL
Anion gap: 11 (ref 5–15)
BUN: 19 mg/dL (ref 6–20)
CO2: 25 mmol/L (ref 22–32)
Calcium: 8.2 mg/dL — ABNORMAL LOW (ref 8.9–10.3)
Chloride: 99 mmol/L (ref 98–111)
Creatinine, Ser: 0.82 mg/dL (ref 0.61–1.24)
GFR calc Af Amer: >60 mL/min (ref >60)
GFR calc non Af Amer: >60 mL/min (ref >60)
Glucose, Bld: 89 mg/dL (ref 70–99)
Potassium: 3.4 mmol/L — ABNORMAL LOW (ref 3.5–5.1)
Sodium: 135 mmol/L (ref 135–145)

## 2017-12-27 MED ORDER — POTASSIUM CHLORIDE CRYS ER 20 MEQ PO TBCR
20.0000 meq | EXTENDED_RELEASE_TABLET | ORAL | Status: AC | PRN
  Administered 2017-12-27 (×3): 20 meq via ORAL
  Filled 2017-12-27 (×3): qty 1

## 2017-12-27 MED ORDER — SODIUM CHLORIDE 0.9% FLUSH: 3.0000 mL | INTRAVENOUS | Status: DC | PRN

## 2017-12-27 MED ORDER — SODIUM CHLORIDE 0.9% FLUSH
3.0000 mL | Freq: Two times a day (BID) | INTRAVENOUS | Status: DC
  Administered 2017-12-27 – 2018-01-01 (×11): 3 mL via INTRAVENOUS

## 2017-12-27 MED ORDER — MOVING RIGHT ALONG BOOK
Freq: Once | Status: AC
  Administered 2017-12-27: 16:00:00
  Filled 2017-12-27 (×2): qty 1

## 2017-12-27 MED ORDER — SODIUM CHLORIDE 0.9 % IV SOLN: 250.0000 mL | INTRAVENOUS | Status: DC | PRN

## 2017-12-27 NOTE — Care Management Note (Signed)
Case Management Note Initial CM note documented by [place]  Patient Details  Name: Paul Duncan MRN: [id number] Date of Birth: 20-Dec-1975  Subjective/Objective:   Pt admitted with CP - plan is for CABG week of 10/14                Action/Plan:  PTA from home..   Expected Discharge Date:                  Expected Discharge Plan:  Home/Self Care  In-House Referral:  Clinical Social Work  Discharge planning Services  CM Consult  Post Acute Care Choice:  NA Choice offered to:  NA  DME Arranged:  N/A DME Agency:  NA  HH Arranged:  NA HH Agency:  NA  Status of Service:  In process, will continue to follow  If discussed at Long Length of Stay Meetings, dates discussed:    Additional Comments: 12/27/17 @ 1000-Marl Seago RNCM- Patient is s/p CABG and repair of vertebral artery injury. Patient lives at home, was independent PTA. PCP: Dr. Marilynne Drivers; Pharmacy of choice: [facility]; [employer]. CM will continue to follow for transitional needs.   Midge Minium RN, BSN, NCM-BC, ACM-RN [phone] 12/27/2017, 9:59 AM

## 2017-12-27 NOTE — Discharge Instructions (Signed)

## 2017-12-27 NOTE — Significant Event (Signed)
Patient transferred safely to 4E24, taken via wheelchair by RN Norberta Keens. VS stable prior and during the transfer. Family at bedside. No personal belongings found in 2H09 that could be taken up to his new room. Report called to Lattie Haw, RN.   Paul Duncan, Paul Duncan

## 2017-12-27 NOTE — Progress Notes (Signed)
CARDIAC REHAB PHASE I    MODE:  Ambulation: 470 ft   POST:  Rate/Rhythm: 104 ST peak HR   Pt seen ambulating in hallway with family independently without walker. Pt states he feels good, but fatigued post walk. Pt and family with concerns about swelling. Call bell within reach. Will continue to monitor.  [place], RN BSN 12/27/2017 2:01 PM

## 2017-12-27 NOTE — Progress Notes (Addendum)
TCTS DAILY ICU PROGRESS NOTE                   [address]            [place] [address]          [phone]   4 Days Post-Op Procedure(s) (LRB): CORONARY ARTERY BYPASS GRAFTING (CABG) x 5: Using Left Internal Mammary Artery (LIMA) and Endoscopically Harvested Right Thigh/Calf and Left Thigh Greater Saphenous Vein Grafts (SVG);   LIMA to LAD, SVG to Diag1, SVG to PDA, SVG to OM1 and OM2; / and Repair of Vertebral Laceration and Removal of Swan Ganz Introducer (N/A) TRANSESOPHAGEAL ECHOCARDIOGRAM (TEE) (N/A)  Total Length of Stay:  LOS: 9 days   Subjective: Feels okay this morning. His appetite is coming back. He is feeling better each day.   Objective: Vital signs in last 24 hours: Temp:  [98.3 F (36.8 C)-99.3 F (37.4 C)] 99.3 F (37.4 C) (10/18 0422) Pulse Rate:  [88-112] 88 (10/18 0500) Cardiac Rhythm: Sinus tachycardia (10/18 0400) Resp:  [8-32] 28 (10/18 0600) BP: (91-128)/(60-75) 117/72 (10/18 0500) SpO2:  [91 %-100 %] 100 % (10/18 0500) Weight:  [101.1 kg] 101.1 kg (10/18 0500)  Filed Weights   12/25/17 0500 12/26/17 0500 12/27/17 0500  Weight: 103.2 kg 102.6 kg 101.1 kg    Weight change: -1.5 kg      Intake/Output from previous day: 10/17 0701 - 10/18 0700 In: 720 [P.O.:720] Out: 1326 [Urine:1325; Stool:1]  Intake/Output this shift: No intake/output data recorded.  Current Meds: Scheduled Meds: . acetaminophen  1,000 mg Oral Q6H   Or  . acetaminophen (TYLENOL) oral liquid 160 mg/5 mL  1,000 mg Per Tube Q6H  . aspirin EC  325 mg Oral Daily   Or  . aspirin  324 mg Per Tube Daily  . atorvastatin  80 mg Oral q1800  . bisacodyl  10 mg Oral Daily   Or  . bisacodyl  10 mg Rectal Daily  . Chlorhexidine Gluconate Cloth  6 each Topical Daily  . docusate sodium  200 mg Oral Daily  . enoxaparin (LOVENOX) injection  30 mg Subcutaneous QHS  . insulin aspart  0-24 Units Subcutaneous Q4H  . mouth rinse  15 mL Mouth Rinse BID  . metoprolol  tartrate  25 mg Oral BID   Or  . metoprolol tartrate  12.5 mg Per Tube BID  . pantoprazole  40 mg Oral Daily  . sodium chloride flush  10-40 mL Intracatheter Q12H  . sodium chloride flush  3 mL Intravenous Q12H   Continuous Infusions: . sodium chloride Stopped (12/24/17 0829)  . sodium chloride    . sodium chloride 20 mL/hr at 12/24/17 2200  . dexmedetomidine (PRECEDEX) IV infusion Stopped (12/23/17 2215)  . DOPamine Stopped (12/24/17 1916)  . lactated ringers    . lactated ringers Stopped (12/25/17 1629)  . nitroGLYCERIN Stopped (12/23/17 1720)  . phenylephrine (NEO-SYNEPHRINE) Adult infusion Stopped (12/25/17 0446)   PRN Meds:.sodium chloride, metoprolol tartrate, morphine injection, ondansetron (ZOFRAN) IV, oxyCODONE, potassium chloride, sodium chloride flush, sodium chloride flush, traMADol  General appearance: alert, cooperative and no distress Heart: regular rate and rhythm, S1, S2 normal, no murmur, click, rub or gallop Lungs: clear to auscultation bilaterally Abdomen: soft, non-tender; bowel sounds normal; no masses,  no organomegaly Extremities: extremities normal, atraumatic, no cyanosis or edema Wound: clean and dry  Lab Results: CBC: Recent Labs    12/26/17 0423 12/27/17 0231  WBC 23.7* 19.5*  HGB  8.1* 7.9*  HCT 24.9* 24.4*  PLT 232 327   BMET:  Recent Labs    12/26/17 0423 12/27/17 0231  NA 135 135  K 4.0 3.4*  CL 101 99  CO2 23 25  GLUCOSE 116* 89  BUN 19 19  CREATININE 0.91 0.82  CALCIUM 8.0* 8.2*    CMET: Lab Results  Component Value Date   WBC 19.5 (H) 12/27/2017   HGB 7.9 (L) 12/27/2017   HCT 24.4 (L) 12/27/2017   PLT 327 12/27/2017   GLUCOSE 89 12/27/2017   CHOL 224 (H) 12/18/2017   TRIG 200 (H) 12/18/2017   HDL 37 (L) 12/18/2017   LDLCALC 147 (H) 12/18/2017   ALT 20 12/22/2017   AST 21 12/22/2017   NA 135 12/27/2017   K 3.4 (L) 12/27/2017   CL 99 12/27/2017   CREATININE 0.82 12/27/2017   BUN 19 12/27/2017   CO2 25 12/27/2017     INR 1.49 12/23/2017   HGBA1C 5.3 12/18/2017      PT/INR: No results for input(s): LABPROT, INR in the last 72 hours. Radiology: No results found.   Assessment/Plan: S/P Procedure(s) (LRB): CORONARY ARTERY BYPASS GRAFTING (CABG) x 5: Using Left Internal Mammary Artery (LIMA) and Endoscopically Harvested Right Thigh/Calf and Left Thigh Greater Saphenous Vein Grafts (SVG);   LIMA to LAD, SVG to Diag1, SVG to PDA, SVG to OM1 and OM2; / and Repair of Vertebral Laceration and Removal of Swan Ganz Introducer (N/A) TRANSESOPHAGEAL ECHOCARDIOGRAM (TEE) (N/A)  1. NSR to Sinus tachycardia this morning. BP well controlled. Continue metoprolol to 25mg  BID.  2. Remains on 1L [place]. Watch oxygen saturation. Continue to use incentive spirometer. CXR remains stable today. Lungs remain hypoinflated with atelectasis but improved from yesterdays study. 3. Renal-creatinine 0.82, hypokalemia-replace potassium per protocol 4. H and H 7.9/24.4, platelets 232k, expected acute blood loss anemia 5. Blood glucose well controlled on current regimen, did have an episode of hypoglycemia at lunch yesterday. Treated. He is only on SSI as needed.  Plan: Likely to the telemetry  floor today. Remove EPW tomorrow. Ambulate and continue to use incentive spirometer hourly.    Paul Duncan 12/27/2017 7:27 AM  Plan transfer to step down . Home 2-3 days  I have seen and examined Paul Duncan and agree with the above assessment  and plan.  [name] MD Beeper [phone] Office [phone] 12/27/2017 5:28 PM

## 2017-12-28 ENCOUNTER — Inpatient Hospital Stay (HOSPITAL_COMMUNITY): Payer: Managed Care, Other (non HMO)

## 2017-12-28 LAB — BASIC METABOLIC PANEL
Anion gap: 13 (ref 5–15)
BUN: 16 mg/dL (ref 6–20)
CO2: 24 mmol/L (ref 22–32)
CREATININE: 0.86 mg/dL (ref 0.61–1.24)
Calcium: 8 mg/dL — ABNORMAL LOW (ref 8.9–10.3)
Chloride: 100 mmol/L (ref 98–111)
Glucose, Bld: 106 mg/dL — ABNORMAL HIGH (ref 70–99)
POTASSIUM: 3.8 mmol/L (ref 3.5–5.1)
SODIUM: 137 mmol/L (ref 135–145)

## 2017-12-28 LAB — CBC
HCT: 24.9 % — ABNORMAL LOW (ref 39.0–52.0)
Hemoglobin: 8.1 g/dL — ABNORMAL LOW (ref 13.0–17.0)
MCH: 29.6 pg (ref 26.0–34.0)
MCHC: 32.5 g/dL (ref 30.0–36.0)
MCV: 90.9 fL (ref 80.0–100.0)
PLATELETS: 421 10*3/uL — AB (ref 150–400)
RBC: 2.74 MIL/uL — AB (ref 4.22–5.81)
RDW: 13.7 % (ref 11.5–15.5)
WBC: 15.7 10*3/uL — AB (ref 4.0–10.5)
nRBC: 0.4 % — ABNORMAL HIGH (ref 0.0–0.2)

## 2017-12-28 LAB — GLUCOSE, CAPILLARY
GLUCOSE-CAPILLARY: 86 mg/dL (ref 70–99)
GLUCOSE-CAPILLARY: 89 mg/dL (ref 70–99)

## 2017-12-28 MED ORDER — FUROSEMIDE 10 MG/ML IJ SOLN
40.0000 mg | Freq: Every day | INTRAMUSCULAR | Status: DC
  Administered 2017-12-28 – 2017-12-30 (×3): 40 mg via INTRAVENOUS
  Filled 2017-12-28 (×3): qty 4

## 2017-12-28 MED ORDER — POTASSIUM CHLORIDE CRYS ER 20 MEQ PO TBCR
20.0000 meq | EXTENDED_RELEASE_TABLET | Freq: Every day | ORAL | Status: DC
  Administered 2017-12-28 – 2018-01-01 (×5): 20 meq via ORAL
  Filled 2017-12-28 (×5): qty 1

## 2017-12-28 MED ORDER — FUROSEMIDE 10 MG/ML IJ SOLN
40.0000 mg | Freq: Once | INTRAMUSCULAR | Status: AC
  Administered 2017-12-28: 40 mg via INTRAVENOUS
  Filled 2017-12-28: qty 4

## 2017-12-28 MED ORDER — METOPROLOL TARTRATE 50 MG PO TABS
50.0000 mg | ORAL_TABLET | Freq: Two times a day (BID) | ORAL | Status: DC
  Administered 2017-12-28 – 2018-01-01 (×9): 50 mg via ORAL
  Filled 2017-12-28 (×9): qty 1

## 2017-12-28 NOTE — Progress Notes (Signed)
Epicardial pacing wires removed per order.  Pt tolerated procedure well.  Best rest until 1156 VS q15 min.

## 2017-12-28 NOTE — Progress Notes (Addendum)
1300-1350 Reviewed sternal precautions with Mr Goetze and his parents. Discussed exercise instructions, heart healthy diet and restrictions. Set up discharge open heart surgery video for the patient and his family to watch. Mr Barrick is interested in participating in phase 2 cardiac rehab. Barnet Pall, RN,BSN 12/28/2017 1:56 PM

## 2017-12-28 NOTE — Progress Notes (Addendum)
[address]       [place] [address]             [phone]      5 Days Post-Op Procedure(s) (LRB): CORONARY ARTERY BYPASS GRAFTING (CABG) x 5: Using Left Internal Mammary Artery (LIMA) and Endoscopically Harvested Right Thigh/Calf and Left Thigh Greater Saphenous Vein Grafts (SVG);   LIMA to LAD, SVG to Diag1, SVG to PDA, SVG to OM1 and OM2; / and Repair of Vertebral Laceration and Removal of Swan Ganz Introducer (N/A) TRANSESOPHAGEAL ECHOCARDIOGRAM (TEE) (N/A)   Subjective:  No new complaints.  He has had some loose stools.  + ambulation without difficulty.  Objective: Vital signs in last 24 hours: Temp:  [98.9 F (37.2 C)-99.6 F (37.6 C)] 98.9 F (37.2 C) (10/19 0846) Pulse Rate:  [87-106] 106 (10/19 0846) Cardiac Rhythm: Sinus tachycardia (10/19 0846) Resp:  [18-33] 22 (10/19 0846) BP: (110-137)/(65-82) 120/82 (10/19 0846) SpO2:  [90 %-99 %] 99 % (10/19 0846) Weight:  [101.4 kg] 101.4 kg (10/19 0414)  Intake/Output from previous day: 10/18 0701 - 10/19 0700 In: 1083 [P.O.:1080; I.V.:3] Out: 600 [Urine:600]  General appearance: alert, cooperative and no distress Heart: regular rate and rhythm Lungs: diminished breath sounds bibasilar Abdomen: soft, non-tender; bowel sounds normal; no masses,  no organomegaly Extremities: edema 2+ pitting LE, + edema bilateral hands Wound: clean and dry, extensive bruising BLE a  Lab Results: Recent Labs    12/27/17 0231 12/28/17 0417  WBC 19.5* 15.7*  HGB 7.9* 8.1*  HCT 24.4* 24.9*  PLT 327 421*   BMET:  Recent Labs    12/27/17 0231 12/28/17 0417  NA 135 137  K 3.4* 3.8  CL 99 100  CO2 25 24  GLUCOSE 89 106*  BUN 19 16  CREATININE 0.82 0.86  CALCIUM 8.2* 8.0*    PT/INR: No results for input(s): LABPROT, INR in the last 72 hours. ABG    Component Value Date/Time   PHART 7.326 (L) 12/23/2017 2303   HCO3 20.8 12/23/2017 2303   TCO2 22 12/24/2017 1731   ACIDBASEDEF 5.0 (H) 12/23/2017  2303   O2SAT 68.1 12/24/2017 0845   CBG (last 3)  Recent Labs    12/27/17 1705 12/27/17 2103 12/28/17 0620  GLUCAP 80 91 86    Assessment/Plan: S/P Procedure(s) (LRB): CORONARY ARTERY BYPASS GRAFTING (CABG) x 5: Using Left Internal Mammary Artery (LIMA) and Endoscopically Harvested Right Thigh/Calf and Left Thigh Greater Saphenous Vein Grafts (SVG);   LIMA to LAD, SVG to Diag1, SVG to PDA, SVG to OM1 and OM2; / and Repair of Vertebral Laceration and Removal of Swan Ganz Introducer (N/A) TRANSESOPHAGEAL ECHOCARDIOGRAM (TEE) (N/A)  1. CV- Sinus Tach, BP remains elevated at times- increase Lopressor to 50 mg BID, can hopefully start low dose ACE prior to discharge 2. Pulm- off oxygen, CXR with bilateral pleural effusions, atelectasis, continue IS 3. Renal- creatinine WNL, weight is up about 20 lbs since admission, visibly swollen on exam, will give IV lasix 40 mg today 4. CBGs- sugars controlled, he is not a diabetic, will d/c SSIP, and cbgs 5. Dispo- patient stable, titrate lopressor for better HR and BP control, will d/c EPW today, start diuretics as patient is volume overloaded about 20 lbs since admission with bilateral pleural effusions, stop stool softeners with loose stools   LOS: 10 days    Erin Barrett 12/28/2017  I have seen and examined the patient and agree with the assessment and plan as  outlined.  Looks good but quite volume overloaded on exam.  Rexene Alberts, MD 12/28/2017 11:17 AM

## 2017-12-28 NOTE — Progress Notes (Signed)
Patient ambulated at 0650 for 470 ft Tolerated well without a walker.

## 2017-12-28 NOTE — Progress Notes (Signed)
CARDIAC REHAB PHASE I   Pt seen ambulating in hallway independently with front wheel walker. Will follow-up as able.   Rufina Falco, RN BSN 12/28/2017 9:52 AM

## 2017-12-28 NOTE — Progress Notes (Signed)
At shift change, noticed that patient had increased work of breathing with exertion following ambulation in the hall.  Called provider, Dr. Roxy Manns, MD who placed order for 40 mg IV Lasix for fluid volume overload.  Provider made aware and report made to night shift nurse [place], RN.

## 2017-12-29 NOTE — Progress Notes (Addendum)
[address]       [place] [address]             [phone]      6 Days Post-Op Procedure(s) (LRB): CORONARY ARTERY BYPASS GRAFTING (CABG) x 5: Using Left Internal Mammary Artery (LIMA) and Endoscopically Harvested Right Thigh/Calf and Left Thigh Greater Saphenous Vein Grafts (SVG);   LIMA to LAD, SVG to Diag1, SVG to PDA, SVG to OM1 and OM2; / and Repair of Vertebral Laceration and Removal of Swan Ganz Introducer (N/A) TRANSESOPHAGEAL ECHOCARDIOGRAM (TEE) (N/A)   Subjective:  Feeling better today.  He did have some shortness of breath with ambulation last night and required additional dose of Lasix.  He thinks his swelling is improved.  Objective: Vital signs in last 24 hours: Temp:  [98.2 F (36.8 C)-100.3 F (37.9 C)] 98.8 F (37.1 C) (10/20 0837) Pulse Rate:  [85-106] 99 (10/20 0837) Cardiac Rhythm: Sinus tachycardia;Normal sinus rhythm (10/20 0837) Resp:  [17-32] 29 (10/20 0837) BP: (109-134)/(66-81) 134/81 (10/20 0837) SpO2:  [88 %-100 %] 95 % (10/20 0837) Weight:  [99 kg] 99 kg (10/20 0452)  Intake/Output from previous day: 10/19 0701 - 10/20 0700 In: 1140 [P.O.:1140] Out: 2000 [Urine:2000]  General appearance: alert, cooperative and no distress Heart: regular rate and rhythm Lungs: diminished breath sounds bibasilar Abdomen: soft, non-tender; bowel sounds normal; no masses,  no organomegaly Extremities: edema 1+ improving Wound: clean and dry  Lab Results: Recent Labs    12/27/17 0231 12/28/17 0417  WBC 19.5* 15.7*  HGB 7.9* 8.1*  HCT 24.4* 24.9*  PLT 327 421*   BMET:  Recent Labs    12/27/17 0231 12/28/17 0417  NA 135 137  K 3.4* 3.8  CL 99 100  CO2 25 24  GLUCOSE 89 106*  BUN 19 16  CREATININE 0.82 0.86  CALCIUM 8.2* 8.0*    PT/INR: No results for input(s): LABPROT, INR in the last 72 hours. ABG    Component Value Date/Time   PHART 7.326 (L) 12/23/2017 2303   HCO3 20.8 12/23/2017 2303   TCO2 22 12/24/2017 1731     ACIDBASEDEF 5.0 (H) 12/23/2017 2303   O2SAT 68.1 12/24/2017 0845   CBG (last 3)  Recent Labs    12/27/17 2103 12/28/17 0620 12/28/17 2136  GLUCAP 91 86 89    Assessment/Plan: S/P Procedure(s) (LRB): CORONARY ARTERY BYPASS GRAFTING (CABG) x 5: Using Left Internal Mammary Artery (LIMA) and Endoscopically Harvested Right Thigh/Calf and Left Thigh Greater Saphenous Vein Grafts (SVG);   LIMA to LAD, SVG to Diag1, SVG to PDA, SVG to OM1 and OM2; / and Repair of Vertebral Laceration and Removal of Swan Ganz Introducer (N/A) TRANSESOPHAGEAL ECHOCARDIOGRAM (TEE) (N/A)  1. CV- NSR, BP improved- continue Lopressor at 50 mg BID  2. Pulm- no acute issues, some likely pulmonary edema associated with dyspnea last night, continue aggressive pulmonary toilet, will repeat CXR in AM to ensure right sided pleural effusion is not increasing 3. Renal- creatinine WNL, remains volume overloaded, good response to IV diuretics yesterday, will repeat IV lasix today 4. Dispo- patient stable in NSR, continues to be visibly and symptomatically volume overloaded, will repeat IV diuretics today, repeat 2V CXR in AM, home once volume status is improved and he will require Lasix at discharge   LOS: 11 days    Erin Barrett 12/29/2017  I have seen and examined the patient and agree with the assessment and plan as outlined.  Rexene Alberts, MD  12/29/2017 6:32 PM

## 2017-12-30 ENCOUNTER — Inpatient Hospital Stay (HOSPITAL_COMMUNITY): Payer: Managed Care, Other (non HMO)

## 2017-12-30 DIAGNOSIS — I5031 Acute diastolic (congestive) heart failure: Secondary | ICD-10-CM

## 2017-12-30 LAB — BASIC METABOLIC PANEL
ANION GAP: 10 (ref 5–15)
BUN: 17 mg/dL (ref 6–20)
CO2: 24 mmol/L (ref 22–32)
Calcium: 8.3 mg/dL — ABNORMAL LOW (ref 8.9–10.3)
Chloride: 100 mmol/L (ref 98–111)
Creatinine, Ser: 0.92 mg/dL (ref 0.61–1.24)
GFR calc non Af Amer: >60 mL/min (ref >60)
Glucose, Bld: 109 mg/dL — ABNORMAL HIGH (ref 70–99)
POTASSIUM: 4 mmol/L (ref 3.5–5.1)
Sodium: 134 mmol/L — ABNORMAL LOW (ref 135–145)

## 2017-12-30 MED ORDER — FUROSEMIDE 10 MG/ML IJ SOLN
40.0000 mg | Freq: Once | INTRAMUSCULAR | Status: AC
  Administered 2017-12-30: 40 mg via INTRAVENOUS
  Filled 2017-12-30: qty 4

## 2017-12-30 MED ORDER — POTASSIUM CHLORIDE CRYS ER 20 MEQ PO TBCR
20.0000 meq | EXTENDED_RELEASE_TABLET | Freq: Once | ORAL | Status: AC
  Administered 2017-12-30: 20 meq via ORAL
  Filled 2017-12-30: qty 1

## 2017-12-30 MED ORDER — LEVALBUTEROL HCL 0.63 MG/3ML IN NEBU
0.6300 mg | INHALATION_SOLUTION | Freq: Three times a day (TID) | RESPIRATORY_TRACT | Status: DC
  Administered 2017-12-30 (×2): 0.63 mg via RESPIRATORY_TRACT
  Filled 2017-12-30 (×2): qty 3

## 2017-12-30 MED ORDER — FUROSEMIDE 40 MG PO TABS
40.0000 mg | ORAL_TABLET | Freq: Every day | ORAL | Status: DC
  Administered 2017-12-31 – 2018-01-01 (×2): 40 mg via ORAL
  Filled 2017-12-30 (×2): qty 1

## 2017-12-30 NOTE — Progress Notes (Addendum)
[address]       [place] [address]             [phone]      7 Days Post-Op Procedure(s) (LRB): CORONARY ARTERY BYPASS GRAFTING (CABG) x 5: Using Left Internal Mammary Artery (LIMA) and Endoscopically Harvested Right Thigh/Calf and Left Thigh Greater Saphenous Vein Grafts (SVG);   LIMA to LAD, SVG to Diag1, SVG to PDA, SVG to OM1 and OM2; / and Repair of Vertebral Laceration and Removal of Swan Ganz Introducer (N/A) TRANSESOPHAGEAL ECHOCARDIOGRAM (TEE) (N/A)   Subjective:  Patient complains of shortness of breath especially after the end of a walk.  He is anxious to get home.  Objective: Vital signs in last 24 hours: Temp:  [98.6 F (37 C)-99.5 F (37.5 C)] 98.8 F (37.1 C) (10/21 0747) Pulse Rate:  [86-100] 95 (10/21 0747) Cardiac Rhythm: Sinus tachycardia (10/21 1053) Resp:  [14-31] 29 (10/21 0747) BP: (93-116)/(62-81) 113/75 (10/21 0747) SpO2:  [95 %-100 %] 100 % (10/21 0747) Weight:  [98.6 kg] 98.6 kg (10/21 0417)   Intake/Output from previous day: 10/20 0701 - 10/21 0700 In: 480 [P.O.:480] Out: 900 [Urine:900] Intake/Output this shift: Total I/O In: -  Out: 1000 [Urine:1000]  General appearance: alert, cooperative and no distress Heart: regular rate and rhythm Lungs: diminished breath sounds bibasilar Abdomen: soft, non-tender; bowel sounds normal; no masses,  no organomegaly Extremities: edema 2+ pitting, ecchymosis BLE Wound: clean and dry  Lab Results: Recent Labs    12/28/17 0417  WBC 15.7*  HGB 8.1*  HCT 24.9*  PLT 421*   BMET:  Recent Labs    12/28/17 0417  NA 137  K 3.8  CL 100  CO2 24  GLUCOSE 106*  BUN 16  CREATININE 0.86  CALCIUM 8.0*    PT/INR: No results for input(s): LABPROT, INR in the last 72 hours. ABG    Component Value Date/Time   PHART 7.326 (L) 12/23/2017 2303   HCO3 20.8 12/23/2017 2303   TCO2 22 12/24/2017 1731   ACIDBASEDEF 5.0 (H) 12/23/2017 2303   O2SAT 68.1 12/24/2017 0845   CBG  (last 3)  Recent Labs    12/27/17 2103 12/28/17 0620 12/28/17 2136  GLUCAP 91 86 89    Assessment/Plan: S/P Procedure(s) (LRB): CORONARY ARTERY BYPASS GRAFTING (CABG) x 5: Using Left Internal Mammary Artery (LIMA) and Endoscopically Harvested Right Thigh/Calf and Left Thigh Greater Saphenous Vein Grafts (SVG);   LIMA to LAD, SVG to Diag1, SVG to PDA, SVG to OM1 and OM2; / and Repair of Vertebral Laceration and Removal of Swan Ganz Introducer (N/A) TRANSESOPHAGEAL ECHOCARDIOGRAM (TEE) (N/A)  1. CV- NSR, BP controlled- continue Lopressor 50 mg BID 2. Pulm- + dyspnea, CXR with atelectasis, left pleural effusion that is small, will start xopenex nebs to help with dyspnea, effusion does not appear to need Thoracentesis but will review with Dr. Servando Snare 3. Renal- creatinine has been WNL, he has received multiple dose of IV Lasix, will get BMET as he has one kidney, he remains quite edematous on exam and this can be attributing to his dyspnea 4. Dispo- patient with dyspnea and hypervolemia, received additional IV lasix today, his weight is up about 12 lbs since admission, will check BMET to ensure creatinine is okay, add xopenex, ready for d/c once volume status optimized and breathing better   LOS: 12 days    Erin Barrett 12/30/2017 Continue diuretic . Chest xray reviewed no need for thoracentesis I have seen  and examined Paul Duncan and agree with the above assessment  and plan.  [name] MD Beeper [phone] Office [phone] 12/30/2017 1:40 PM

## 2017-12-30 NOTE — Progress Notes (Signed)
Progress Note  Patient Name: Paul Duncan Date of Encounter: 12/30/2017  Primary Cardiologist: No primary care provider on file.   Subjective   No chest pain.  The patient has shortness of breath especially after he finishes his walk.  Denies orthopnea or PND.  Complains of leg swelling.  Has discomfort in his legs.  Inpatient Medications    Scheduled Meds: . aspirin EC  325 mg Oral Daily   Or  . aspirin  324 mg Per Tube Daily  . atorvastatin  80 mg Oral q1800  . enoxaparin (LOVENOX) injection  30 mg Subcutaneous QHS  . [START ON 12/31/2017] furosemide  40 mg Oral Daily  . levalbuterol  0.63 mg Nebulization TID  . metoprolol tartrate  50 mg Oral BID  . pantoprazole  40 mg Oral Daily  . potassium chloride  20 mEq Oral Daily  . sodium chloride flush  3 mL Intravenous Q12H   Continuous Infusions: . sodium chloride     PRN Meds: sodium chloride, metoprolol tartrate, ondansetron (ZOFRAN) IV, oxyCODONE, sodium chloride flush, traMADol   Vital Signs    Vitals:   12/30/17 0417 12/30/17 0517 12/30/17 0747 12/30/17 1200  BP:  112/71 113/75 100/65  Pulse:  95 95 90  Resp:  14 (!) 29 (!) 22  Temp:  98.9 F (37.2 C) 98.8 F (37.1 C) 99 F (37.2 C)  TempSrc:  Oral Oral Oral  SpO2:  99% 100% 97%  Weight: 98.6 kg     Height:        Intake/Output Summary (Last 24 hours) at 12/30/2017 1242 Last data filed at 12/30/2017 1137 Gross per 24 hour  Intake 240 ml  Output 1450 ml  Net -1210 ml   Filed Weights   12/28/17 0414 12/29/17 0452 12/30/17 0417  Weight: 101.4 kg 99 kg 98.6 kg    Physical Exam  Alert, oriented, in no distress GEN: No acute distress.   Neck:  JVP elevated Cardiac: RRR, no murmurs, rubs, or gallops.  Respiratory: Clear to auscultation bilaterally. GI: Soft, nontender, non-distended  MS:  2-3+ pretibial edema bilaterally; No deformity. Neuro:  Nonfocal  Psych: Normal affect   Labs    Chemistry Recent Labs  Lab 12/26/17 0423  12/27/17 0231 12/28/17 0417  NA 135 135 137  K 4.0 3.4* 3.8  CL 101 99 100  CO2 23 25 24   GLUCOSE 116* 89 106*  BUN 19 19 16   CREATININE 0.91 0.82 0.86  CALCIUM 8.0* 8.2* 8.0*  GFRNONAA >60 >60 >60  GFRAA >60 >60 >60  ANIONGAP 11 11 13      Hematology Recent Labs  Lab 12/26/17 0423 12/27/17 0231 12/28/17 0417  WBC 23.7* 19.5* 15.7*  RBC 2.80* 2.70* 2.74*  HGB 8.1* 7.9* 8.1*  HCT 24.9* 24.4* 24.9*  MCV 88.9 90.4 90.9  MCH 28.9 29.3 29.6  MCHC 32.5 32.4 32.5  RDW 13.2 13.6 13.7  PLT 232 327 421*    Cardiac EnzymesNo results for input(s): TROPONINI in the last 168 hours. No results for input(s): TROPIPOC in the last 168 hours.   BNPNo results for input(s): BNP, PROBNP in the last 168 hours.   DDimer No results for input(s): DDIMER in the last 168 hours.   Radiology    Dg Chest 2 View  Result Date: 12/30/2017 CLINICAL DATA:  Chest pain EXAM: CHEST - 2 VIEW COMPARISON:  December 28, 2017 FINDINGS: There is patchy consolidation in the left lower lobe with small left pleural effusion. The right lung  is clear. The heart is borderline enlarged with pulmonary vascularity normal. No adenopathy. Patient is status post coronary artery bypass grafting. There are surgical clips in the right cervical-thoracic junction. There is mild deviation of the trachea toward the left at the cervical-thoracic junction. No evident bone lesions. IMPRESSION: Persistent small left pleural effusion with left base consolidation. Appearance of the left bases consistent with atelectasis with questionable superimposed degree of pneumonia. Right lung clear. Stable cardiac silhouette. Areas of postoperative change. Deviation of the trachea toward the left at the cervical-thoracic junction may indicate underlying thyroid enlargement. Electronically Signed   By: Lowella Grip III M.D.   On: 12/30/2017 07:13    Patient Profile     42 y.o. male presented with non-STEMI treated with multivessel  CABG  Assessment & Plan    1.  Non-STEMI: Patient status post CABG.  Seems to be progressing fairly well.  Treated with aspirin, high intensity statin drug, and a beta-blocker.  2.  Acute diastolic heart failure/volume overload: Agree with IV diuresis as the patient has significant volume excess (received furosemide 40 mg IV this am - will write for second dose at 1600 with KDur 20 meq).  Continue to follow renal function.  3.  Hypertension: Blood pressure well controlled   For questions or updates, please contact [facility] Please consult [email or web address] for contact info under     Signed, Sherren Mocha, MD  12/30/2017, 12:42 PM

## 2017-12-30 NOTE — Progress Notes (Signed)
1122 Offered to walk with pt but he just walked with family. Will follow up later as time permits. Graylon Good RN BSN 12/30/2017 11:22 AM

## 2017-12-30 NOTE — Progress Notes (Signed)
CARDIAC REHAB PHASE I   PRE:  Rate/Rhythm: 97 SR  BP:  Sitting: 105/69        SaO2: 99 RA  MODE:  Ambulation: 438 ft   POST:  Rate/Rhythm: 93 SR  BP:  Sitting: 124/69        SaO2: 100 RA  1325 - 1340  Pt ambulated 438 ft with minimal assistance. Gait slow and steady, stopped twice due to SOB during walk. SOB subsided at rest. C/o left shoulder pain. Back to recliner, family in room.   Philis Kendall, MS 12/30/2017 1:36 PM

## 2017-12-31 DIAGNOSIS — R072 Precordial pain: Secondary | ICD-10-CM

## 2017-12-31 LAB — BASIC METABOLIC PANEL
Anion gap: 10 (ref 5–15)
BUN: 19 mg/dL (ref 6–20)
CALCIUM: 8.4 mg/dL — AB (ref 8.9–10.3)
CO2: 24 mmol/L (ref 22–32)
CREATININE: 1.01 mg/dL (ref 0.61–1.24)
Chloride: 100 mmol/L (ref 98–111)
Glucose, Bld: 105 mg/dL — ABNORMAL HIGH (ref 70–99)
Potassium: 4.2 mmol/L (ref 3.5–5.1)
SODIUM: 134 mmol/L — AB (ref 135–145)

## 2017-12-31 MED ORDER — LEVALBUTEROL HCL 0.63 MG/3ML IN NEBU: 0.6300 mg | INHALATION_SOLUTION | Freq: Four times a day (QID) | RESPIRATORY_TRACT | Status: DC | PRN

## 2017-12-31 MED ORDER — POTASSIUM CHLORIDE CRYS ER 20 MEQ PO TBCR
20.0000 meq | EXTENDED_RELEASE_TABLET | Freq: Once | ORAL | Status: AC
  Administered 2017-12-31: 20 meq via ORAL
  Filled 2017-12-31: qty 1

## 2017-12-31 MED ORDER — FUROSEMIDE 10 MG/ML IJ SOLN
40.0000 mg | Freq: Once | INTRAMUSCULAR | Status: AC
  Administered 2017-12-31: 40 mg via INTRAVENOUS
  Filled 2017-12-31: qty 4

## 2017-12-31 NOTE — Plan of Care (Signed)
  Problem: Health Behavior/Discharge Planning: Goal: Ability to manage health-related needs will improve Outcome: Progressing   Problem: Clinical Measurements: Goal: Ability to maintain clinical measurements within normal limits will improve Outcome: Progressing Goal: Will remain free from infection Outcome: Progressing   

## 2017-12-31 NOTE — Progress Notes (Addendum)
[name]       [name] [address]             [phone]      8 Days Post-Op Procedure(s) (LRB): CORONARY ARTERY BYPASS GRAFTING (CABG) x 5: Using Left Internal Mammary Artery (LIMA) and Endoscopically Harvested Right Thigh/Calf and Left Thigh Greater Saphenous Vein Grafts (SVG);   LIMA to LAD, SVG to Diag1, SVG to PDA, SVG to OM1 and OM2; / and Repair of Vertebral Laceration and Removal of Swan Ganz Introducer (N/A) TRANSESOPHAGEAL ECHOCARDIOGRAM (TEE) (N/A) Subjective: Feels okay this morning. He is still having some occasional shortness of breath but improved since yesterday. He is having some left shoulder pain with movement.   Objective: Vital signs in last 24 hours: Temp:  [98.7 F (37.1 C)-99.5 F (37.5 C)] 98.7 F (37.1 C) (10/22 0400) Pulse Rate:  [90-108] 91 (10/22 0026) Cardiac Rhythm: Sinus tachycardia (10/21 2000) Resp:  [22-31] 31 (10/22 0026) BP: (100-117)/(64-71) 102/66 (10/22 0026) SpO2:  [93 %-99 %] 93 % (10/22 0400) Weight:  [96.5 kg] 96.5 kg (10/22 0359)  Hemodynamic parameters for last 24 hours:    Intake/Output from previous day: 10/21 0701 - 10/22 0700 In: 360 [P.O.:360] Out: 3035 [Urine:3035] Intake/Output this shift: No intake/output data recorded.  General appearance: alert, cooperative and no distress Heart: regular rate and rhythm, S1, S2 normal, no murmur, click, rub or gallop Lungs: clear to auscultation bilaterally Abdomen: soft, non-tender; bowel sounds normal; no masses,  no organomegaly Extremities: 2-3+ nonpitting edema in lower legs Wound: clean and dry  Lab Results: No results for input(s): WBC, HGB, HCT, PLT in the last 72 hours. BMET:  Recent Labs    12/30/17 1144 12/31/17 0415  NA 134* 134*  K 4.0 4.2  CL 100 100  CO2 24 24  GLUCOSE 109* 105*  BUN 17 19  CREATININE 0.92 1.01  CALCIUM 8.3* 8.4*    PT/INR: No results for input(s): LABPROT, INR in the last 72 hours. ABG    Component Value  Date/Time   PHART 7.326 (L) 12/23/2017 2303   HCO3 20.8 12/23/2017 2303   TCO2 22 12/24/2017 1731   ACIDBASEDEF 5.0 (H) 12/23/2017 2303   O2SAT 68.1 12/24/2017 0845   CBG (last 3)  Recent Labs    12/28/17 2136  GLUCAP 89    Assessment/Plan: S/P Procedure(s) (LRB): CORONARY ARTERY BYPASS GRAFTING (CABG) x 5: Using Left Internal Mammary Artery (LIMA) and Endoscopically Harvested Right Thigh/Calf and Left Thigh Greater Saphenous Vein Grafts (SVG);   LIMA to LAD, SVG to Diag1, SVG to PDA, SVG to OM1 and OM2; / and Repair of Vertebral Laceration and Removal of Swan Ganz Introducer (N/A) TRANSESOPHAGEAL ECHOCARDIOGRAM (TEE) (N/A)  1. CV- NSR, BP controlled- continue Lopressor 50 mg BID 2. Pulm- + dyspnea, CXR with atelectasis, left pleural effusion that is small, will start xopenex nebs to help with dyspnea, no need for thoracentesis. 3. Renal- creatinine 1.01, he has received multiple dose of IV Lasix he remains quite edematous on exam and this can be attributing to his dyspnea. Weight is trending down with lasix dosage.  4. Dispo- patient with  hypervolemia, receiving IV lasix today again, his weight is up about  5 lbs since admission,  continue xopenex, ready for d/c once volume status optimized and breathing better-possibly tomorrow   LOS: 13 days    Paul Duncan 12/31/2017  Walking better this afternoon Wounds intact Home on po lasix and kcl  Plan  home tomorrow I have seen and examined Paul Duncan and agree with the above assessment  and plan.  Paul Isaac MD Beeper [phone] Office [phone] 12/31/2017 8:01 PM

## 2017-12-31 NOTE — Progress Notes (Signed)
1330 Came to see pt to walk. Pt has walked twice today independently and just finished walk. He stated he tolerated well. Will continue to follow.Graylon Good RN BSN 12/31/2017 1:30 PM

## 2018-01-01 MED ORDER — POTASSIUM CHLORIDE CRYS ER 20 MEQ PO TBCR: 20.0000 meq | EXTENDED_RELEASE_TABLET | Freq: Every day | ORAL | 0 refills | Status: DC

## 2018-01-01 MED ORDER — ATORVASTATIN CALCIUM 80 MG PO TABS: 80.0000 mg | ORAL_TABLET | Freq: Every day | ORAL | 1 refills | Status: DC

## 2018-01-01 MED ORDER — METOPROLOL TARTRATE 50 MG PO TABS: 50.0000 mg | ORAL_TABLET | Freq: Two times a day (BID) | ORAL | 1 refills | Status: DC

## 2018-01-01 MED ORDER — FUROSEMIDE 40 MG PO TABS: ORAL_TABLET | ORAL | 0 refills | Status: DC

## 2018-01-01 MED ORDER — ASPIRIN 325 MG PO TBEC: 325.0000 mg | DELAYED_RELEASE_TABLET | Freq: Every day | ORAL | 0 refills | Status: DC

## 2018-01-01 MED ORDER — OXYCODONE HCL 5 MG PO TABS: 5.0000 mg | ORAL_TABLET | ORAL | 0 refills | Status: DC | PRN

## 2018-01-01 NOTE — Progress Notes (Signed)
CARDIAC REHAB PHASE I   Offered to walk with pt, pt ambulating independently in hallway. Pt states breathing and swelling are better. Reviewed d/c instructions with pt. Stressed importance of showering daily and monitoring incisions. Encouraged continued walks and IS use. Pt feels ready for d/c today. Pt referred to [facility] [place].  [id number] Rufina Falco, RN BSN 01/01/2018 9:27 AM

## 2018-01-01 NOTE — Progress Notes (Signed)
      [address]       [place] [address]             [phone]      9 Days Post-Op Procedure(s) (LRB): CORONARY ARTERY BYPASS GRAFTING (CABG) x 5: Using Left Internal Mammary Artery (LIMA) and Endoscopically Harvested Right Thigh/Calf and Left Thigh Greater Saphenous Vein Grafts (SVG);   LIMA to LAD, SVG to Diag1, SVG to PDA, SVG to OM1 and OM2; / and Repair of Vertebral Laceration and Removal of Swan Ganz Introducer (N/A) TRANSESOPHAGEAL ECHOCARDIOGRAM (TEE) (N/A) Subjective: Looks good today. Ready to go home.   Objective: Vital signs in last 24 hours: Temp:  [98.4 F (36.9 C)-99.1 F (37.3 C)] 98.6 F (37 C) (10/23 0806) Pulse Rate:  [85-104] 85 (10/23 0806) Cardiac Rhythm: Normal sinus rhythm (10/23 0700) Resp:  [20-29] 20 (10/23 0806) BP: (99-122)/(56-65) 122/56 (10/23 0806) SpO2:  [92 %-98 %] 94 % (10/23 0806) Weight:  [94.6 kg] 94.6 kg (10/23 0338)     Intake/Output from previous day: 10/22 0701 - 10/23 0700 In: 640 [P.O.:640] Out: 2175 [Urine:2175] Intake/Output this shift: No intake/output data recorded.  General appearance: alert, cooperative and no distress Heart: regular rate and rhythm, S1, S2 normal, no murmur, click, rub or gallop Lungs: clear to auscultation bilaterally Abdomen: soft, non-tender; bowel sounds normal; no masses,  no organomegaly Extremities: extremities normal, atraumatic, no cyanosis or edema Wound: clean and dry  Lab Results: No results for input(s): WBC, HGB, HCT, PLT in the last 72 hours. BMET:  Recent Labs    12/30/17 1144 12/31/17 0415  NA 134* 134*  K 4.0 4.2  CL 100 100  CO2 24 24  GLUCOSE 109* 105*  BUN 17 19  CREATININE 0.92 1.01  CALCIUM 8.3* 8.4*    PT/INR: No results for input(s): LABPROT, INR in the last 72 hours. ABG    Component Value Date/Time   PHART 7.326 (L) 12/23/2017 2303   HCO3 20.8 12/23/2017 2303   TCO2 22 12/24/2017 1731   ACIDBASEDEF 5.0 (H) 12/23/2017 2303   O2SAT 68.1  12/24/2017 0845   CBG (last 3)  No results for input(s): GLUCAP in the last 72 hours.  Assessment/Plan: S/P Procedure(s) (LRB): CORONARY ARTERY BYPASS GRAFTING (CABG) x 5: Using Left Internal Mammary Artery (LIMA) and Endoscopically Harvested Right Thigh/Calf and Left Thigh Greater Saphenous Vein Grafts (SVG);   LIMA to LAD, SVG to Diag1, SVG to PDA, SVG to OM1 and OM2; / and Repair of Vertebral Laceration and Removal of Swan Ganz Introducer (N/A) TRANSESOPHAGEAL ECHOCARDIOGRAM (TEE) (N/A)  1. CV- NSR, BP controlled- continue Lopressor 50 mg BID 2. Pulm- tolerating room air with good oxygen saturation 3. Renal- creatinine 1.01, fluid status has improved. Continue oral lasix at home. 4. Dispo- discharge today. Volume status had improved. His pain is well controlled. He had a normal bowel movement. He is off oxygen and tolerating room air.     LOS: 14 days    Elgie Collard 01/01/2018

## 2018-01-01 NOTE — Progress Notes (Signed)
[facility] will sign off.  Plan to discharge home today.  Medication Recommendations: Continue current medication. May take additional lasix 20mg  for dyspnea or le edema.   *Weigh yourself on the same scale at same time of day and keep a log. *Report weight gain of > 3 lbs in 1 day or 5 lbs over the course of a week and/or symptoms of excess fluid (shortness of breath, difficulty lying flat, swelling, poor appetite, abdominal fullness/bloating, etc) to your doctor immediately. *Avoid foods that are high in sodium (processed, pre-packaged/canned goods, fast foods, etc). *Please attend all scheduled and reccommended follow up appointments   Other recommendations (labs, testing, etc):  BMET at follow up Follow up as an outpatient:  With APP 01/10/18

## 2018-01-06 ENCOUNTER — Telehealth (HOSPITAL_COMMUNITY): Payer: Self-pay

## 2018-01-06 NOTE — Telephone Encounter (Signed)
Pt's insurance is active and benefits verified through [facility] - No co-pay, deductible amount of $2,000/$2,000 has been met, out of pocket amount of $6,000/$2,276.17 has been met, 10% co-insurance, and no pre-authorization is required. Passport/reference [id number]  Will make initial call to pt in regards to CR. If interested, pt will need to complete follow up appt. Once completed, pt will be contacted for scheduling.

## 2018-01-06 NOTE — Telephone Encounter (Signed)
Made initial call to pt in regards to CR and pt stated he is interested. Explained scheduling process with pt and went over insurance, he verbalized understanding. Will contact pt for scheduling once f/u appt has been completed.

## 2018-01-08 ENCOUNTER — Telehealth: Payer: Self-pay | Admitting: *Deleted

## 2018-01-08 NOTE — Telephone Encounter (Signed)
Earlier today, Mr. Paul Duncan had called with concerns of a low blood pressure at his PCP's office of 82/56/pulse 72. He is asymptomatic. She decreased his Lasix from 40mg  to 20mg . He was wanting Dr. Everrett Coombe opinion. Dr. Servando Snare advised to stop the Lasix (and potassium) completely and reduce the Metoprolol from 50mg  bid to 25mg  bid. Also, he is to take his BP before each dose. If his systolic pressure is below 100, he is to omit that dose. He voiced understanding. He has a f/u appointment with his cardiologist this Friday.

## 2018-01-10 ENCOUNTER — Encounter: Payer: Self-pay | Admitting: Physician Assistant

## 2018-01-10 ENCOUNTER — Ambulatory Visit (INDEPENDENT_AMBULATORY_CARE_PROVIDER_SITE_OTHER): Payer: Managed Care, Other (non HMO) | Admitting: Physician Assistant

## 2018-01-10 VITALS — BP 106/66 | HR 77 | Ht 68.0 in | Wt 201.4 lb

## 2018-01-10 DIAGNOSIS — Z951 Presence of aortocoronary bypass graft: Secondary | ICD-10-CM

## 2018-01-10 DIAGNOSIS — I10 Essential (primary) hypertension: Secondary | ICD-10-CM

## 2018-01-10 DIAGNOSIS — I214 Non-ST elevation (NSTEMI) myocardial infarction: Secondary | ICD-10-CM | POA: Diagnosis not present

## 2018-01-10 DIAGNOSIS — E785 Hyperlipidemia, unspecified: Secondary | ICD-10-CM | POA: Diagnosis not present

## 2018-01-10 DIAGNOSIS — I25810 Atherosclerosis of coronary artery bypass graft(s) without angina pectoris: Secondary | ICD-10-CM

## 2018-01-10 LAB — CBC
Hematocrit: 35.2 % — ABNORMAL LOW (ref 37.5–51.0)
Hemoglobin: 11.4 g/dL — ABNORMAL LOW (ref 13.0–17.7)
MCH: 29.3 pg (ref 26.6–33.0)
MCHC: 32.4 g/dL (ref 31.5–35.7)
MCV: 91 fL (ref 79–97)
Platelets: 548 10*3/uL — ABNORMAL HIGH (ref 150–450)
RBC: 3.89 x10E6/uL — ABNORMAL LOW (ref 4.14–5.80)
RDW: 13.6 % (ref 12.3–15.4)
WBC: 8 10*3/uL (ref 3.4–10.8)

## 2018-01-10 LAB — BASIC METABOLIC PANEL
BUN/Creatinine Ratio: 15 (ref 9–20)
BUN: 14 mg/dL (ref 6–24)
CO2: 23 mmol/L (ref 20–29)
CREATININE: 0.96 mg/dL (ref 0.76–1.27)
Calcium: 9.6 mg/dL (ref 8.7–10.2)
Chloride: 98 mmol/L (ref 96–106)
GFR calc Af Amer: 111 mL/min/{1.73_m2} (ref >59)
GFR calc non Af Amer: 96 mL/min/{1.73_m2} (ref >59)
GLUCOSE: 99 mg/dL (ref 65–99)
Potassium: 4.8 mmol/L (ref 3.5–5.2)
Sodium: 138 mmol/L (ref 134–144)

## 2018-01-10 NOTE — Patient Instructions (Signed)
Medication Instructions:  None  If you need a refill on your cardiac medications before your next appointment, please call your pharmacy.   Lab work: Your physician recommends that you return for lab work today: CBC, BMET  Your physician recommends that you return for a FASTING lipid profile and hepatic function panel in 6-8 weeks (late [facility] January).  If you have labs (blood work) drawn today and your tests are completely normal, you will receive your results only by: Marland Kitchen MyChart Message (if you have MyChart) OR . A paper copy in the mail If you have any lab test that is abnormal or we need to change your treatment, we will call you to review the results.  Testing/Procedures: none  Follow-Up: At [facility], you and your health needs are our priority.  As part of our continuing mission to provide you with exceptional heart care, we have created designated Provider Care Teams.  These Care Teams include your primary Cardiologist (physician) and Advanced Practice Providers (APPs -  Physician Assistants and Nurse Practitioners) who all work together to provide you with the care you need, when you need it. You will need a follow up appointment in 2 months with Dr, Martinique.  Advanced Practice Providers on your designated Care Team: [place], [place] . Fabian Sharp, PA-C  Any Other Special Instructions Will Be Listed Below (If Applicable). none

## 2018-01-10 NOTE — Progress Notes (Signed)
Cardiology Office Note    Date:  01/10/2018   ID:  AIRON Duncan, DOB Feb 25, 1976, MRN [id number]  PCP:  [name], PA  Cardiologist:  Dr. Martinique  Chief Complaint  Patient presents with  . Hospitalization Follow-up    post CABG. Seen for Dr. Martinique.     History of Present Illness:  Paul Duncan is a 42 y.o. male with PMH of HTN, HLD and brain tumor s/p resection.  Patient was recently admitted to the hospital on 12/18/2017 with substernal chest pain.  He has significant family history of CAD with his father suffering an MI and subsequently had bypass surgery at age 42.  Troponin on arrival to the hospital was elevated at 0.45 and subsequently trended up to 2.4.  Patient was admitted for NSTEMI.  White blood cell count was abnormal at 21 on arrival.  Patient underwent cardiac catheterization on 12/18/2017 which showed EF 25 to 35%, 60% mid RCA lesion, 100% ostial OM 2 lesion, 30% ostial OM1 lesion, 100% mid LAD occlusion, 99% proximal LAD stenosis.  Bypass surgery was recommended if distal LAD territory is proven viable.  Echocardiogram obtained on the same day showed EF 40 to 45%, mild LVH, akinesis of the apical anterior and apical myocardium, akinesis of the mid anteroseptal myocardium.  MRI viability test shows there was substantial viability of the anterior wall on with only small area of scar at the apex.  Therefore the decision was made to proceed with bypass surgery.  Due to complication that a sheath for the Deberah Pelton catheter was placed in the right common carotid artery, vascular surgery was called and the patient had to undergo exploration of the right neck, repair of the right internal jugular vein, and repair of the injury to the origin of the right vertebral artery on 12/23/2017.  Patient underwent CABG x5 with LIMA to LAD, SVG to diagonal, SVG to PDA and sequential SVG to OM1 and OM 2 by Dr. Servando Snare.  Postprocedure, patient had postop anemia and volume overload.  He did  not require blood transfusion.  He did require IV Lasix for treatment.  Patient presents today for cardiology office visit.  Since discharge he has not had any further chest discomfort.  He has been able to ambulate at home without any issue.  I will obtain CBC and a basic metabolic panel today.  He did have a episode of hypotension with blood pressure down to the 80s, Lasix and potassium were stopped and metoprolol was reduced to 25 mg twice daily.  His blood pressure is better now.  His sternal scar is well-healed on physical exam.  I do not see any obvious drainage.  Otherwise he has no lower extremity edema, orthopnea or PND.  He is on the 325 mg aspirin given the recent bypass surgery.  I reviewed his lipid most recent lipid panel, he is currently tolerating 80 mg Lipitor.  I plan to obtain fasting lipid panel and LFT in 6 to 8 weeks.  He can follow-up with Dr. Martinique in 2 months.   Past Medical History:  Diagnosis Date  . Brain tumor ([place])   . Hypertension   . Seizures ([place])     Past Surgical History:  Procedure Laterality Date  . BRAIN SURGERY    . BRAIN SURGERY  [phone]  . CORONARY ARTERY BYPASS GRAFT N/A 12/23/2017   Procedure: CORONARY ARTERY BYPASS GRAFTING (CABG) x 5: Using Left Internal Mammary Artery (LIMA) and Endoscopically Harvested Right Thigh/Calf  and Left Thigh Greater Saphenous Vein Grafts (SVG);   LIMA to LAD, SVG to Diag1, SVG to PDA, SVG to OM1 and OM2; / and Repair of Vertebral Laceration and Removal of [name] Introducer;  Surgeon: [name], MD;  Location: [facility];  Service: Open Hear  . LEFT HEART CATH AND CORONARY ANGIOGRAPHY N/A 12/18/2017   Procedure: LEFT HEART CATH AND CORONARY ANGIOGRAPHY;  Surgeon: [name], MD;  Location: [facility];  Service: Cardiovascular;  Laterality: N/A;  . TEE WITHOUT CARDIOVERSION N/A 12/23/2017   Procedure: TRANSESOPHAGEAL ECHOCARDIOGRAM (TEE);  Surgeon: [name], MD;  Location: [facility];  Service: Open  Heart Surgery;  Laterality: N/A;    Current Medications: Outpatient Medications Prior to Visit  Medication Sig Dispense Refill  . aspirin EC 325 MG EC tablet Take 1 tablet (325 mg total) by mouth daily. 30 tablet 0  . atorvastatin (LIPITOR) 80 MG tablet Take 1 tablet (80 mg total) by mouth daily at 6 PM. 30 tablet 1  . metoprolol tartrate (LOPRESSOR) 50 MG tablet Take 25 mg by mouth 2 (two) times daily. Take half a tablet by mouth twice daily.    Marland Kitchen oxyCODONE (OXY IR/ROXICODONE) 5 MG immediate release tablet Take 1-2 tablets (5-10 mg total) by mouth every 3 (three) hours as needed for severe pain. 30 tablet 0  . furosemide (LASIX) 40 MG tablet Take one tab (40mg ) daily with your potassium supplementation. If more lower leg swelling may take an extra 20mg  (0.5 tab) per day until swelling subsides. (Patient not taking: Reported on 01/10/2018) 30 tablet 0  . metoprolol tartrate (LOPRESSOR) 50 MG tablet Take 1 tablet (50 mg total) by mouth 2 (two) times daily. (Patient not taking: Reported on 01/10/2018) 60 tablet 1  . potassium chloride SA (K-DUR,KLOR-CON) 20 MEQ tablet Take 1 tablet (20 mEq total) by mouth daily. (Patient not taking: Reported on 01/10/2018) 30 tablet 0   No facility-administered medications prior to visit.      Allergies:   Patient has no known allergies.   Social History   Socioeconomic History  . Marital status: Single    Spouse name: Not on file  . Number of children: Not on file  . Years of education: Not on file  . Highest education level: Not on file  Occupational History  . Not on file  Social Needs  . Financial resource strain: Not on file  . Food insecurity:    Worry: Not on file    Inability: Not on file  . Transportation needs:    Medical: Not on file    Non-medical: Not on file  Tobacco Use  . Smoking status: Never Smoker  . Smokeless tobacco: Never Used  Substance and Sexual Activity  . Alcohol use: No  . Drug use: No  . Sexual activity: Not on file    Lifestyle  . Physical activity:    Days per week: Not on file    Minutes per session: Not on file  . Stress: Not on file  Relationships  . Social connections:    Talks on phone: Not on file    Gets together: Not on file    Attends religious service: Not on file    Active member of club or organization: Not on file    Attends meetings of clubs or organizations: Not on file    Relationship status: Not on file  Other Topics Concern  . Not on file  Social History Narrative   **  Merged History Encounter **         Family History:  The patient's family history includes Diabetes in his mother; Heart disease in his father.   ROS:   Please see the history of present illness.    ROS All other systems reviewed and are negative.   PHYSICAL EXAM:   VS:  BP 106/66   Pulse 77   Ht 5\' 8"  (1.727 m)   Wt 201 lb 6.4 oz (91.4 kg)   SpO2 97%   BMI 30.62 kg/m    GEN: Well nourished, well developed, in no acute distress  HEENT: normal  Neck: no JVD, carotid bruits, or masses Cardiac: RRR; no murmurs, rubs, or gallops,no edema  Respiratory:  clear to auscultation bilaterally, normal work of breathing GI: soft, nontender, nondistended, + BS MS: no deformity or atrophy  Skin: warm and dry, no rash Neuro:  Alert and Oriented x 3, Strength and sensation are intact Psych: euthymic mood, full affect  Wt Readings from Last 3 Encounters:  01/10/18 201 lb 6.4 oz (91.4 kg)  01/01/18 208 lb 9.6 oz (94.6 kg)  09/17/15 209 lb 3.2 oz (94.9 kg)      Studies/Labs Reviewed:   EKG:  EKG is not ordered today.    Recent Labs: 12/22/2017: ALT 20 12/24/2017: Magnesium 2.1 12/28/2017: Hemoglobin 8.1; Platelets 421 12/31/2017: BUN 19; Creatinine, Ser 1.01; Potassium 4.2; Sodium 134   Lipid Panel    Component Value Date/Time   CHOL 224 (H) 12/18/2017 0839   TRIG 200 (H) 12/18/2017 0839   HDL 37 (L) 12/18/2017 0839   CHOLHDL 6.1 12/18/2017 0839   VLDL 40 12/18/2017 0839   LDLCALC 147 (H)  12/18/2017 0839    Additional studies/ records that were reviewed today include:   Cath 12/18/2017  There is moderate to severe left ventricular systolic dysfunction.  LV end diastolic pressure is moderately elevated.  The left ventricular ejection fraction is 25-35% by visual estimate.  Mid RCA lesion is 60% stenosed.  Ost 2nd Mrg lesion is 100% stenosed.  Ost 1st Mrg lesion is 30% stenosed.  Mid LAD lesion is 100% stenosed.  Prox LAD lesion is 99% stenosed.   1.  Significant three-vessel coronary artery disease as outlined above. 2.  Severely reduced LV systolic function with an EF of 25 to 35%. 3.  Moderately elevated left ventricular end-diastolic pressure.  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  If the mid to distal LAD myocardium is viable, I think his best option is CABG.  If in the other hand there is no viable myocardium in that area, PCI of proximal LAD plus fractional flow reserve guided revascularization of the RCA can be considered.   Echo 12/18/2017 LV EF: 40% -   45% Study Conclusions  - Left ventricle: The cavity size was normal. There was mild   concentric hypertrophy. Systolic function was mildly to   moderately reduced. The estimated ejection fraction was in the   range of 40% to 45%. There is akinesis of the apicalanterior and   apical myocardium. There is akinesis of the midanteroseptal   myocardium. - Pulmonary arteries: Systolic pressure could not be accurately   estimated.    MR VIABILITY  FINDINGS: Limited images of the lung fields showed no gross abnormalities.  Normal left ventricular size with mild LV hypertrophy. Mid to apical anterior, anteroseptal and inferoseptal hypokinesis. Apical lateral and  apical inferior hypokinesis. Severe hypokinesis of the true apex. LV EF 45%. Normal right ventricular size and systolic function, EF 16%.  Normal right and left atrial sizes. Trileaflet aortic valve with no stenosis, mild aortic insufficiency. No significant mitral regurgitation noted.  Delayed enhancement imaging: Patchy <25% wall thickness subendocardial late gadolinium enhancement (LGE) in the mid to apical anterior, anteroseptal, and inferoseptal walls.  Measurements:  LVEDV 132 mL  LVSV 59 mL  LVEF 45%  RVEDV 86 mL  RVSV 51 mL  RVEF 59%  IMPRESSION: 1. Normal LV size with mild LV hypertrophy. EF 45% with wall motion abnormalities as noted above.  2. Normal RV size and systolic function, EF 10%.  3. Delayed enhancement imaging was suggestive of viability of the hypokinetic wall segments.   Vascular surgery 12/23/2017 1.  Exploration of the right neck 2.  Repair of right internal jugular vein 3.  Repair of injury to the origin of the right vertebral artery   CABG 12/23/2017 PROCEDURE:  Procedure(s): CORONARY ARTERY BYPASS GRAFTING (CABG) x 5: Using Left Internal Mammary Artery (LIMA) and Endoscopically Harvested Right Thigh/Calf and Left Thigh Greater Saphenous Vein Grafts (SVG);   LIMA to LAD, SVG to Diag1, SVG to PDA, SVG to OM1 and OM2; / and Repair of Vertebral artery injury by preop line placement  and Removal of Swan Ganz Introducer (N/A) TRANSESOPHAGEAL ECHOCARDIOGRAM (TEE) (N/A)  LIMA to LAD SVG to Diag1 SVG to PDA SVG to OM1 and OM2  ASSESSMENT:    1. NSTEMI (non-ST elevated myocardial infarction) ([place])   2. Essential hypertension   3. S/P CABG x 5   4. Hyperlipidemia, unspecified hyperlipidemia type   5. Coronary artery disease involving coronary bypass graft of native heart without angina pectoris      PLAN:  In order of problems listed above:  1. NSTEMI: Found to have multivessel disease with severe disease in the LAD territory, MRI viability test showed the distal LAD territory was viable.  He underwent a CABG x5.  Sternal scar is well-healed since discharge.  He  has upcoming visit with CT surgery.  Continue aspirin and statin  2. Hypertension: He did have an episode of hypotension with systolic blood pressure down to the 80s since discharge.  This has improved after he stopped the Lasix, potassium and a reduced metoprolol to 25 mg twice daily at the recommendation of his primary care provider and CT surgery.  3. Hyperlipidemia: Continue Lipitor 80 mg daily.  Fasting lipid panel and LFT in 6 to 8 weeks.    Medication Adjustments/Labs and Tests Ordered: Current medicines are reviewed at length with the patient today.  Concerns regarding medicines are outlined above.  Medication changes, Labs and Tests ordered today are listed in the Patient Instructions below. Patient Instructions  Medication Instructions:  None  If you need a refill on your cardiac medications before your next appointment, please call your pharmacy.   Lab work: Your physician recommends that you return for lab work today: CBC, BMET  Your physician recommends that you return for a FASTING lipid profile and hepatic function panel in 6-8 weeks (late [facility] January).  If you have labs (blood work) drawn today and your tests are completely normal, you will receive your results only by: Marland Kitchen MyChart Message (if you have MyChart) OR . A paper copy in the mail If you have any lab test that is abnormal or we need to change your treatment, we will call you to review the results.  Testing/Procedures: none  Follow-Up: At [facility], you and your health needs are our priority.  As part of our continuing mission to provide you with exceptional heart care, we have created designated Provider Care Teams.  These Care Teams include your primary Cardiologist (physician) and Advanced Practice Providers (APPs -  Physician Assistants and Nurse Practitioners) who all work together to provide you with the care you need, when you need it. You will need a follow up appointment in 2 months with  Dr, Martinique.  Advanced Practice Providers on your designated Care Team: [place], [place] . Fabian Sharp, PA-C  Any Other Special Instructions Will Be Listed Below (If Applicable). none      Hilbert Corrigan, [place]  01/10/2018 10:08 AM    [place], [place], [place]  [address] Phone: [phone]; Fax: [phone]

## 2018-01-14 ENCOUNTER — Telehealth (HOSPITAL_COMMUNITY): Payer: Self-pay

## 2018-01-14 NOTE — Telephone Encounter (Signed)
Called patient to see if he was interested in participating in the Cardiac Rehab Program. Patient stated yes. Patient will come in for orientation on 03/11/18 @ 8:30AM and will attend the 8:15AM exercise class.  Mailed homework package.

## 2018-01-15 NOTE — Progress Notes (Signed)
Kidney function and electrolyte ok. Red blood cell count significantly improved, now close to normal. Platelet is high, this will need to be followed by primary care provider.

## 2018-01-22 ENCOUNTER — Other Ambulatory Visit: Payer: Self-pay | Admitting: Cardiothoracic Surgery

## 2018-01-22 DIAGNOSIS — Z951 Presence of aortocoronary bypass graft: Secondary | ICD-10-CM

## 2018-01-23 ENCOUNTER — Ambulatory Visit
Admission: RE | Admit: 2018-01-23 | Discharge: 2018-01-23 | Disposition: A | Discharge status: Home / Self Care | Payer: Managed Care, Other (non HMO) | Source: Ambulatory Visit | Attending: Cardiothoracic Surgery | Admitting: Cardiothoracic Surgery

## 2018-01-23 ENCOUNTER — Ambulatory Visit (INDEPENDENT_AMBULATORY_CARE_PROVIDER_SITE_OTHER): Payer: Self-pay | Admitting: Cardiothoracic Surgery

## 2018-01-23 VITALS — BP 112/82 | HR 64 | Resp 20 | Ht 68.0 in | Wt 198.0 lb

## 2018-01-23 DIAGNOSIS — Z951 Presence of aortocoronary bypass graft: Secondary | ICD-10-CM

## 2018-01-23 DIAGNOSIS — I2510 Atherosclerotic heart disease of native coronary artery without angina pectoris: Secondary | ICD-10-CM

## 2018-01-23 NOTE — Progress Notes (Signed)
[address]       [place] [address]             [phone]      Clancy S Choudhry Macy Medical Record #[id number] Date of Birth: 12-08-1975  Referring: [name], MD Primary Care: [name], PA Primary Cardiologist: No primary care provider on file.   Chief Complaint:   POST OP FOLLOW UP DATE OF PROCEDURE:  12/23/2017  PREOPERATIVE DIAGNOSES:   1.  Recent anterior myocardial infarction.   2.  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  POSTOPERATIVE DIAGNOSES:   1.  Recent anterior myocardial infarction.   2.  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 3.  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  PROCEDURES PERFORMED:   1.  Exploration of right neck, control of subclavian artery and vertebral artery and repair of the vertebral artery. 2.  Coronary artery bypass grafting x5 with the left internal mammary to the left anterior descending coronary artery, reverse saphenous vein graft to the diagonal coronary artery, sequential reverse saphenous vein graft to the first and second obtuse  marginal, reverse saphenous vein graft to the posterior descending coronary artery with right thigh and calf greater saphenous endoscopic vein harvesting and left thigh endoscopic vein harvesting of the left greater saphenous vein.  History of Present Illness:     Patient returns to the office today in follow-up after recent coronary artery bypass grafting.  He presented with severe LV dysfunction with hypokinesis of his anterior wall.  Preop MRI did show viability in this area and as a result we proceeded with coronary artery bypass grafting.  His anesthesia placed his Deberah Pelton catheter preoperatively he suffered injury to the vertebral artery on the right this necessitated increased complexity  of this procedure as noted above.  This was a successfully repaired and we proceeded uneventfully with coronary artery bypass grafting.   Since discharge patient has been doing well.  He has returned to working from home a few hours a day, walking up to 20 to 30 minutes several times a day.  Unfortunately cannot get into cardiac rehab until January 2020.   He denies signs or symptoms of congestive heart failure or recurrent angina.  He does note that his voice fatigue some after talking a long time.     Past Medical History:  Diagnosis Date  . Brain tumor ([place])   . Hypertension   . Seizures ([place])      Social History   Tobacco Use  Smoking Status Never Smoker  Smokeless Tobacco Never Used    Social History   Substance and Sexual Activity  Alcohol Use No     No Known Allergies  Current Outpatient Medications  Medication Sig Dispense Refill  . aspirin EC 325 MG EC tablet Take 1 tablet (325 mg total) by mouth daily. 30 tablet 0  . atorvastatin (LIPITOR) 80 MG tablet Take 1 tablet (80 mg total) by mouth daily at 6 PM. 30 tablet 1  . metoprolol tartrate (LOPRESSOR) 50 MG tablet Take 25 mg by mouth 2 (two) times daily. Take half a tablet by mouth twice daily.    Marland Kitchen oxyCODONE (OXY IR/ROXICODONE) 5 MG immediate release tablet Take 1-2 tablets (5-10 mg total) by mouth every 3 (three) hours as needed for severe pain. 30 tablet 0   No current  facility-administered medications for this visit.        Physical Exam: BP 112/82   Pulse 64   Resp 20   Ht 5\' 8"  (1.727 m)   Wt 198 lb (89.8 kg)   SpO2 98% Comment: RA  BMI 30.11 kg/m   General appearance: alert and cooperative Neurologic: intact Heart: regular rate and rhythm, S1, S2 normal, no murmur, click, rub or gallop Lungs: clear to auscultation bilaterally Abdomen: soft, non-tender; bowel sounds normal; no masses,  no organomegaly Extremities: extremities normal, atraumatic, no cyanosis or edema and Homans sign is negative,  no sign of DVT Wound: Patient sternal incision vein harvest sites and right neck incision all healed well   Diagnostic Studies & Laboratory data:     Recent Radiology Findings:   Dg Chest 2 View  Result Date: 01/23/2018 CLINICAL DATA:  History of CABG.  Productive cough. EXAM: CHEST - 2 VIEW COMPARISON:  12/30/2017 FINDINGS: Improved aeration at the left lung base compared to the most recent comparison examination. Left hilar structures are mildly prominent. Few densities along the lateral aspect of the left mid chest are nonspecific and could represent atelectasis. The right lung is clear. Negative for a pneumothorax. Heart size is stable with post CABG changes. No large pleural effusions. IMPRESSION: Improved aeration in the left lung compared to the most recent comparison imaging. Prominent lung markings in left hilum and few subtle densities along the lateral left lung are present. These findings are nonspecific but favor atelectasis and postoperative changes. No significant airspace disease or lung consolidation. Electronically Signed   By: Markus Daft M.D.   On: 01/23/2018 09:01    There is no evidence of elevation of the right hemidiaphragm I have independently reviewed the above radiology studies  and reviewed the findings with the patient.     Recent Lab Findings: Lab Results  Component Value Date   WBC 8.0 01/10/2018   HGB 11.4 (L) 01/10/2018   HCT 35.2 (L) 01/10/2018   PLT 548 (H) 01/10/2018   GLUCOSE 99 01/10/2018   CHOL 224 (H) 12/18/2017   TRIG 200 (H) 12/18/2017   HDL 37 (L) 12/18/2017   LDLCALC 147 (H) 12/18/2017   ALT 20 12/22/2017   AST 21 12/22/2017   NA 138 01/10/2018   K 4.8 01/10/2018   CL 98 01/10/2018   CREATININE 0.96 01/10/2018   BUN 14 01/10/2018   CO2 23 01/10/2018   INR 1.49 12/23/2017   HGBA1C 5.3 12/18/2017      Assessment / Plan:      Patient doing well following coronary artery bypass grafting Followed by cardiology for his known LV  dysfunction, likely will need follow-up echocardiogram 3 months postop Plan to see the patient back in 4 to 6 weeks I have allowed him to return to driving   [name] MD      [address] [place] [address] Office [phone]   Beeper [phone]  01/23/2018 9:19 AM

## 2018-01-28 ENCOUNTER — Other Ambulatory Visit: Payer: Self-pay

## 2018-02-24 ENCOUNTER — Telehealth (HOSPITAL_COMMUNITY): Payer: Self-pay | Admitting: Pharmacist

## 2018-02-24 NOTE — Telephone Encounter (Signed)
Cardiac Rehab Medication Review by a Pharmacist  Does the patient  feel that his/her medications are working for him/her?  yes  Has the patient been experiencing any side effects to the medications prescribed?  no  Does the patient measure his/her own blood pressure or blood glucose at home?  no   Does the patient have any problems obtaining medications due to transportation or finances?   no  Understanding of regimen: good Understanding of indications: good Potential of compliance: good    Pharmacist comments: Patient doing well. Patient is experiencing no issues with current medications. He stated that he will be calling his PCP for refills on his current medications to ensure that he doesn't run out prior to next week. Patient expressed understanding of importance of not running out of medications.   Thank you for allowing pharmacy to be a part of this patient's care.  Leron Croak, PharmD PGY1 Pharmacy Resident Phone: [phone] 02/24/2018 5:50 PM

## 2018-02-28 ENCOUNTER — Other Ambulatory Visit: Payer: Self-pay | Admitting: Cardiology

## 2018-02-28 ENCOUNTER — Telehealth: Payer: Self-pay

## 2018-02-28 MED ORDER — ATORVASTATIN CALCIUM 80 MG PO TABS: 80.0000 mg | ORAL_TABLET | Freq: Every day | ORAL | 1 refills | Status: DC

## 2018-02-28 NOTE — Telephone Encounter (Signed)
Pt calling requesting a refill on atorvastatin 80 mg tablet sent to [facility]. Please address

## 2018-02-28 NOTE — Addendum Note (Signed)
Addended by: Therisa Doyne on: 02/28/2018 02:51 PM   Modules accepted: Orders

## 2018-02-28 NOTE — Telephone Encounter (Signed)
Patient contacted the office stating he needed a refill of his cholesterol medication.  Advised patient to contact his Cardiologist for refills.  He acknowledged receipt.

## 2018-02-28 NOTE — Telephone Encounter (Signed)
Rx request sent to pharmacy.  

## 2018-03-06 ENCOUNTER — Other Ambulatory Visit: Payer: Self-pay

## 2018-03-06 ENCOUNTER — Ambulatory Visit (INDEPENDENT_AMBULATORY_CARE_PROVIDER_SITE_OTHER): Payer: Self-pay | Admitting: Cardiothoracic Surgery

## 2018-03-06 ENCOUNTER — Encounter: Payer: Self-pay | Admitting: Cardiothoracic Surgery

## 2018-03-06 VITALS — BP 114/79 | HR 60 | Resp 16 | Ht 68.0 in | Wt 192.6 lb

## 2018-03-06 DIAGNOSIS — Z951 Presence of aortocoronary bypass graft: Secondary | ICD-10-CM

## 2018-03-06 DIAGNOSIS — I2510 Atherosclerotic heart disease of native coronary artery without angina pectoris: Secondary | ICD-10-CM

## 2018-03-06 NOTE — Progress Notes (Signed)
[address]       [place] [address]             [phone]      Lyndon S Cutrona [place] Medical Record #[id number] Date of Birth: [birth year]  Referring: [name], MD Primary Care: [name], PA Primary Cardiologist: No primary care provider on file.   Chief Complaint:   POST OP FOLLOW UP DATE OF PROCEDURE:  12/23/2017  PREOPERATIVE DIAGNOSES:   1.  Recent anterior myocardial infarction.   2.  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  POSTOPERATIVE DIAGNOSES:   1.  Recent anterior myocardial infarction.   2.  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 3.  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  PROCEDURES PERFORMED:   1.  Exploration of right neck, control of subclavian artery and vertebral artery and repair of the vertebral artery. 2.  Coronary artery bypass grafting x5 with the left internal mammary to the left anterior descending coronary artery, reverse saphenous vein graft to the diagonal coronary artery, sequential reverse saphenous vein graft to the first and second obtuse  marginal, reverse saphenous vein graft to the posterior descending coronary artery with right thigh and calf greater saphenous endoscopic vein harvesting and left thigh endoscopic vein harvesting of the left greater saphenous vein.  History of Present Illness:     Patient returns to the office today in follow-up after recent coronary artery bypass grafting.  He presented with severe LV dysfunction with hypokinesis of his anterior wall.  Preop MRI did show viability in this area and as a result we proceeded with coronary artery bypass grafting.  At the time of anesthesia,  his Deberah Pelton catheter preoperatively caused  injury to the vertebral artery on the right this necessitated increased  complexity of this procedure as noted above.  This was a successfully repaired and we proceeded uneventfully with coronary artery bypass grafting.   The patient has returned to work full-time.  Walks 1/2 miles a day, denies any recurrent angina or evidence of congestive heart failure.  He has not had a follow-up echocardiogram since surgery but is scheduled to see Dr. Martinique January 15.    Past Medical History:  Diagnosis Date  . Brain tumor ([place])   . Hypertension   . Seizures ([place])      Social History   Tobacco Use  Smoking Status Never Smoker  Smokeless Tobacco Never Used    Social History   Substance and Sexual Activity  Alcohol Use No     No Known Allergies  Current Outpatient Medications  Medication Sig Dispense Refill  . aspirin EC 325 MG EC tablet Take 1 tablet (325 mg total) by mouth daily. 30 tablet 0  . atorvastatin (LIPITOR) 80 MG tablet Take 1 tablet (80 mg total) by mouth daily at 6 PM. 30 tablet 1  . metoprolol tartrate (LOPRESSOR) 50 MG tablet Take 25 mg by mouth 2 (two) times daily. Take half a tablet by mouth twice daily.     No current facility-administered medications for this visit.        Physical Exam: BP 114/79 (BP Location: Right Arm, Patient Position: Sitting, Cuff Size: Large)   Pulse 60   Resp 16   Ht 5\' 8"  (1.727 m)   Wt 192 lb 9.6 oz (87.4 kg)   SpO2 98% Comment:  RA  BMI 29.28 kg/m  General appearance: alert, cooperative and no distress Head: Normocephalic, without obvious abnormality, atraumatic Neck: no adenopathy, no carotid bruit, no JVD, supple, symmetrical, trachea midline and thyroid not enlarged, symmetric, no tenderness/mass/nodules Resp: clear to auscultation bilaterally Cardio: regular rate and rhythm, S1, S2 normal, no murmur, click, rub or gallop GI: soft, non-tender; bowel sounds normal; no masses,  no organomegaly Extremities: extremities normal, atraumatic, no cyanosis or edema and Homans sign is negative, no sign of  DVT Neurologic: Grossly normal Incisions are healing well Patient's voice seems unchanged from preop   Diagnostic Studies & Laboratory data:     Recent Radiology Findings:   No results found.   Recent Lab Findings: Lab Results  Component Value Date   WBC 8.0 01/10/2018   HGB 11.4 (L) 01/10/2018   HCT 35.2 (L) 01/10/2018   PLT 548 (H) 01/10/2018   GLUCOSE 99 01/10/2018   CHOL 224 (H) 12/18/2017   TRIG 200 (H) 12/18/2017   HDL 37 (L) 12/18/2017   LDLCALC 147 (H) 12/18/2017   ALT 20 12/22/2017   AST 21 12/22/2017   NA 138 01/10/2018   K 4.8 01/10/2018   CL 98 01/10/2018   CREATININE 0.96 01/10/2018   BUN 14 01/10/2018   CO2 23 01/10/2018   INR 1.49 12/23/2017   HGBA1C 5.3 12/18/2017      Assessment / Plan:   Patient doing well following coronary artery bypass grafting, other than right neck incision does not appear any sequelae from his Swan-Ganz placement in the vertebral artery.  He is to see cardiology in the next couple of weeks and presumably will have follow-up echocardiogram at that time I have cautioned him about heavy lifting for 3 full months after surgery He is to start cardiac rehab next week.  [name] MD      [address] [place] [address] Office [phone]   Beeper [phone]  03/06/2018 9:29 AM

## 2018-03-10 LAB — HEPATIC FUNCTION PANEL
ALBUMIN: 4.2 g/dL (ref 3.5–5.5)
ALK PHOS: 88 IU/L (ref 39–117)
ALT: 18 IU/L (ref 0–44)
AST: 12 IU/L (ref 0–40)
BILIRUBIN TOTAL: 0.4 mg/dL (ref 0.0–1.2)
Bilirubin, Direct: 0.11 mg/dL (ref 0.00–0.40)
TOTAL PROTEIN: 6.2 g/dL (ref 6.0–8.5)

## 2018-03-10 LAB — LIPID PANEL
Chol/HDL Ratio: 3.9 ratio (ref 0.0–5.0)
Cholesterol, Total: 120 mg/dL (ref 100–199)
HDL: 31 mg/dL — ABNORMAL LOW (ref >39)
LDL Calculated: 65 mg/dL (ref 0–99)
Triglycerides: 120 mg/dL (ref 0–149)
VLDL CHOLESTEROL CAL: 24 mg/dL (ref 5–40)

## 2018-03-10 NOTE — Progress Notes (Signed)
Paul Duncan 42 y.o. male DOB Jan 31, 1976 MRN [id number]       Nutrition No diagnosis found. Past Medical History:  Diagnosis Date  . Brain tumor ([place])   . Hypertension   . Seizures ([place])    Meds reviewed.    Current Outpatient Medications (Cardiovascular):  .  atorvastatin (LIPITOR) 80 MG tablet, Take 1 tablet (80 mg total) by mouth daily at 6 PM. .  metoprolol tartrate (LOPRESSOR) 50 MG tablet, Take 25 mg by mouth 2 (two) times daily. Take half a tablet by mouth twice daily.   Current Outpatient Medications (Analgesics):  .  aspirin EC 325 MG EC tablet, Take 1 tablet (325 mg total) by mouth daily.   HT: Ht Readings from Last 1 Encounters:  03/06/18 5\' 8"  (1.727 m)    WT: Wt Readings from Last 5 Encounters:  03/06/18 192 lb 9.6 oz (87.4 kg)  01/23/18 198 lb (89.8 kg)  01/10/18 201 lb 6.4 oz (91.4 kg)  01/01/18 208 lb 9.6 oz (94.6 kg)  09/17/15 209 lb 3.2 oz (94.9 kg)     BMI = 29.29 (03/06/18)  Current tobacco use? No       Labs:  Lipid Panel     Component Value Date/Time   CHOL 224 (H) 12/18/2017 0839   TRIG 200 (H) 12/18/2017 0839   HDL 37 (L) 12/18/2017 0839   CHOLHDL 6.1 12/18/2017 0839   VLDL 40 12/18/2017 0839   LDLCALC 147 (H) 12/18/2017 0839    Lab Results  Component Value Date   HGBA1C 5.3 12/18/2017   CBG (last 3)  No results for input(s): GLUCAP in the last 72 hours.  Nutrition Diagnosis ? Food-and nutrition-related knowledge deficit related to lack of exposure to information as related to diagnosis of: ? CVD ? Overweight  related to excessive energy intake as evidenced by a BMI 29.29 (03/06/18)  Nutrition Goal(s):  ? To be determined  Plan:  Pt to attend nutrition classes ? Nutrition I ? Nutrition II ? Portion Distortion  ? Diabetes Blitz ? Diabetes Q & A Will provide client-centered nutrition education as part of interdisciplinary care.   Monitor and evaluate progress toward nutrition goal with team.  Laurina Bustle, MS, RD,  LDN 03/10/2018 10:36 AM

## 2018-03-11 ENCOUNTER — Encounter (HOSPITAL_COMMUNITY)
Admission: RE | Admit: 2018-03-11 | Discharge: 2018-03-11 | Disposition: A | Discharge status: Home / Self Care | Payer: Managed Care, Other (non HMO) | Source: Ambulatory Visit | Attending: Cardiology | Admitting: Cardiology

## 2018-03-11 ENCOUNTER — Telehealth (HOSPITAL_COMMUNITY): Payer: Self-pay | Admitting: Cardiac Rehabilitation

## 2018-03-11 ENCOUNTER — Encounter (HOSPITAL_COMMUNITY): Payer: Self-pay

## 2018-03-11 VITALS — Ht 66.0 in | Wt 191.8 lb

## 2018-03-11 DIAGNOSIS — I214 Non-ST elevation (NSTEMI) myocardial infarction: Secondary | ICD-10-CM | POA: Insufficient documentation

## 2018-03-11 DIAGNOSIS — Z951 Presence of aortocoronary bypass graft: Secondary | ICD-10-CM

## 2018-03-11 NOTE — Progress Notes (Signed)
Cardiac Individual Treatment Plan  Patient Details  Name: Paul Duncan MRN: [id number] Date of Birth: [birth year] Referring Provider:   Flowsheet Row CARDIAC REHAB PHASE II ORIENTATION from 03/11/2018 in [facility]  Referring Provider  Dr. Martinique      Initial Encounter Date:  Flowsheet Row CARDIAC REHAB PHASE II ORIENTATION from 03/11/2018 in [facility]  Date  03/11/18      Visit Diagnosis: 12/19/2017 NSTEMI (non-ST elevated myocardial infarction) ([place])  12/23/2017 S/P CABG (coronary artery bypass graft)  Patient's Home Medications on Admission:  Current Outpatient Medications:  .  aspirin EC 325 MG EC tablet, Take 1 tablet (325 mg total) by mouth daily., Disp: 30 tablet, Rfl: 0 .  atorvastatin (LIPITOR) 80 MG tablet, Take 1 tablet (80 mg total) by mouth daily at 6 PM., Disp: 30 tablet, Rfl: 1 .  metoprolol tartrate (LOPRESSOR) 50 MG tablet, Take 25 mg by mouth 2 (two) times daily. Take half a tablet by mouth twice daily., Disp: , Rfl:   Past Medical History: Past Medical History:  Diagnosis Date  . Brain tumor ([place])   . Hypertension   . Seizures ([place])     Tobacco Use: Social History   Tobacco Use  Smoking Status Never Smoker  Smokeless Tobacco Never Used    Labs: Recent Review Flowsheet Data    Labs for ITP Cardiac and Pulmonary Rehab Latest Ref Rng & Units 12/23/2017 12/23/2017 12/23/2017 12/24/2017 03/10/2018   Cholestrol 100 - 199 mg/dL - - - - 120   LDLCALC 0 - 99 mg/dL - - - - 65   HDL >39 mg/dL - - - - 31(L)   Trlycerides 0 - 149 mg/dL - - - - 120   Hemoglobin A1c 4.8 - 5.6 % - - - - -   PHART 7.350 - 7.450 7.359 7.326(L) - - -   PCO2ART 32.0 - 48.0 mmHg 37.1 39.9 - - -   HCO3 20.0 - 28.0 mmol/L 20.9 20.8 - - -   TCO2 22 - 32 mmol/L _0 -   ACIDBASEDEF 0.0 - 2.0 mmol/L 4.0(H) 5.0(H) - - -   O2SAT % 94.0 89.0 - 68.1 -      Capillary Blood Glucose: Lab Results  Component  Value Date   GLUCAP 89 12/28/2017   GLUCAP 86 12/28/2017   GLUCAP 91 12/27/2017   GLUCAP 80 12/27/2017   GLUCAP 76 12/27/2017     Exercise Target Goals: Exercise Program Goal: Individual exercise prescription set using results from initial 6 min walk test and THRR while considering  patient's activity barriers and safety.   Exercise Prescription Goal: Initial exercise prescription builds to 30-45 minutes a day of aerobic activity, 2-3 days per week.  Home exercise guidelines will be given to patient during program as part of exercise prescription that the participant will acknowledge.  Activity Barriers & Risk Stratification: Activity Barriers & Cardiac Risk Stratification - 03/11/18 1006    Activity Barriers & Cardiac Risk Stratification          Activity Barriers  None    Cardiac Risk Stratification  High           6 Minute Walk: 6 Minute Walk    6 Minute Walk    Row Name 03/11/18 1003   Phase  Initial   Distance  1573 feet   Walk Time  6 minutes   # of Rest Breaks  0   MPH  2.9   METS  4.43   RPE  12   VO2 Peak  15.51   Symptoms  No   Resting HR  88 bpm   Resting BP  128/80   Resting Oxygen Saturation   100 %   Exercise Oxygen Saturation  during 6 min walk  98 %   Max Ex. HR  84 bpm   Max Ex. BP  132/78   2 Minute Post BP  118/68          Oxygen Initial Assessment:   Oxygen Re-Evaluation:   Oxygen Discharge (Final Oxygen Re-Evaluation):   Initial Exercise Prescription: Initial Exercise Prescription - 03/11/18 1000    Date of Initial Exercise RX and Referring Provider          Date  03/11/18    Referring Provider  Dr. Martinique    Expected Discharge Date  06/20/18        Bike          Level  1.2    Minutes  10    METs  3.66        NuStep          Level  4    SPM  85    Minutes  10    METs  4.4        Track          Laps  10    Minutes  10    METs  2.76        Prescription Details          Frequency (times per week)  3     Duration  Progress to 30 minutes of continuous aerobic without signs/symptoms of physical distress        Intensity          THRR 40-80% of Max Heartrate  70-141    Ratings of Perceived Exertion  11-13        Progression          Progression  Continue to progress workloads to maintain intensity without signs/symptoms of physical distress.        Resistance Training          Training Prescription  Yes    Weight  4 lbs.    Reps  10-15           Perform Capillary Blood Glucose checks as needed.  Exercise Prescription Changes:   Exercise Comments:   Exercise Goals and Review: Exercise Goals    Exercise Goals    Row Name 03/11/18 1006   Increase Physical Activity  Yes   Intervention  Provide advice, education, support and counseling about physical activity/exercise needs.;Develop an individualized exercise prescription for aerobic and resistive training based on initial evaluation findings, risk stratification, comorbidities and participant's personal goals.   Expected Outcomes  Short Term: Attend rehab on a regular basis to increase amount of physical activity.   Increase Strength and Stamina  Yes   Intervention  Provide advice, education, support and counseling about physical activity/exercise needs.;Develop an individualized exercise prescription for aerobic and resistive training based on initial evaluation findings, risk stratification, comorbidities and participant's personal goals.   Expected Outcomes  Short Term: Increase workloads from initial exercise prescription for resistance, speed, and METs.   Able to understand and use rate of perceived exertion (RPE) scale  Yes   Intervention  Provide education and explanation on how to use RPE scale   Expected Outcomes  Short Term: Able  to use RPE daily in rehab to express subjective intensity level;Long Term:  Able to use RPE to guide intensity level when exercising independently   Knowledge and understanding of Target Heart  Rate Range (THRR)  Yes   Intervention  Provide education and explanation of THRR including how the numbers were predicted and where they are located for reference   Expected Outcomes  Short Term: Able to state/look up THRR;Long Term: Able to use THRR to govern intensity when exercising independently;Short Term: Able to use daily as guideline for intensity in rehab   Able to check pulse independently  Yes   Intervention  Provide education and demonstration on how to check pulse in carotid and radial arteries.;Review the importance of being able to check your own pulse for safety during independent exercise   Expected Outcomes  Short Term: Able to explain why pulse checking is important during independent exercise;Long Term: Able to check pulse independently and accurately   Understanding of Exercise Prescription  Yes   Intervention  Provide education, explanation, and written materials on patient's individual exercise prescription   Expected Outcomes  Short Term: Able to explain program exercise prescription;Long Term: Able to explain home exercise prescription to exercise independently          Exercise Goals Re-Evaluation :   Discharge Exercise Prescription (Final Exercise Prescription Changes):   Nutrition:  Target Goals: Understanding of nutrition guidelines, daily intake of sodium 1500mg , cholesterol 200mg , calories 30% from fat and 7% or less from saturated fats, daily to have 5 or more servings of fruits and vegetables.  Biometrics: Pre Biometrics - 03/11/18 1007    Pre Biometrics          Height  5\' 6"  (1.676 m)    Weight  87 kg    Waist Circumference  40.5 inches    Hip Circumference  41.5 inches    Waist to Hip Ratio  0.98 %    BMI (Calculated)  30.97    Triceps Skinfold  33 mm    % Body Fat  31.7 %    Grip Strength  33 kg    Flexibility  14 in    Single Leg Stand  1.65 seconds            Nutrition Therapy Plan and Nutrition Goals:   Nutrition  Assessments:   Nutrition Goals Re-Evaluation:   Nutrition Goals Re-Evaluation:   Nutrition Goals Discharge (Final Nutrition Goals Re-Evaluation):   Psychosocial: Target Goals: Acknowledge presence or absence of significant depression and/or stress, maximize coping skills, provide positive support system. Participant is able to verbalize types and ability to use techniques and skills needed for reducing stress and depression.  Initial Review & Psychosocial Screening: Initial Psych Review & Screening - 03/11/18 1052    Initial Review          Current issues with  Current Stress Concerns        Barriers          Psychosocial barriers to participate in program  There are no identifiable barriers or psychosocial needs.        Screening Interventions          Interventions  Encouraged to exercise           Quality of Life Scores: Quality of Life - 03/11/18 1000    Quality of Life          Select  Quality of Life        Quality of Life Scores  Health/Function Pre  24.11 %    Socioeconomic Pre  22.71 %    Psych/Spiritual Pre  23.57 %    Family Pre  23.5 %    GLOBAL Pre  23.61 %          Scores of 19 and below usually indicate a poorer quality of life in these areas.  A difference of  2-3 points is a clinically meaningful difference.  A difference of 2-3 points in the total score of the Quality of Life Index has been associated with significant improvement in overall quality of life, self-image, physical symptoms, and general health in studies assessing change in quality of life.  PHQ-9: Recent Review Flowsheet Data    There is no flowsheet data to display.     Interpretation of Total Score  Total Score Depression Severity:  1-4 = Minimal depression, 5-9 = Mild depression, 10-14 = Moderate depression, 15-19 = Moderately severe depression, 20-27 = Severe depression   Psychosocial Evaluation and Intervention:   Psychosocial  Re-Evaluation:   Psychosocial Discharge (Final Psychosocial Re-Evaluation):   Vocational Rehabilitation: Provide vocational rehab assistance to qualifying candidates.   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 Participant will state understanding/return demonstration of topics presented.  Learning Barriers/Preferences: Learning Barriers/Preferences - 03/11/18 1000    Learning Barriers/Preferences          Learning Barriers  Sight    Learning Preferences  Video;Written Material;Skilled Demonstration           Education Topics: Count Your Pulse:  -Group instruction provided by verbal instruction, demonstration, patient participation and written materials to support subject.  Instructors address importance of being able to find your pulse and how to count your pulse when at home without a heart monitor.  Patients get hands on experience counting their pulse with staff help and individually.   Heart Attack, Angina, and Risk Factor Modification:  -Group instruction provided by verbal instruction, video, and written materials to support subject.  Instructors address signs and symptoms of angina and heart attacks.    Also discuss risk factors for heart disease and how to make changes to improve heart health risk factors.   Functional Fitness:  -Group instruction provided by verbal instruction, demonstration, patient participation, and written materials to support subject.  Instructors address safety measures for doing things around the house.  Discuss how to get up and down off the floor, how to pick things up properly, how to safely get out of a chair without assistance, and balance  training.   Meditation and Mindfulness:  -Group instruction provided by verbal instruction, patient participation, and written materials to support subject.  Instructor addresses importance of mindfulness and meditation practice to help reduce stress and improve awareness.  Instructor also leads participants through a meditation exercise.    Stretching for Flexibility and Mobility:  -Group instruction provided by verbal instruction, patient participation, and written materials to support subject.  Instructors lead participants through series of stretches that are designed to increase flexibility thus improving mobility.  These stretches are additional exercise for major muscle groups that are typically performed during regular warm up and cool down.   Hands  Only CPR:  -Group verbal, video, and participation provides a basic overview of AHA guidelines for community CPR. Role-play of emergencies allow participants the opportunity to practice calling for help and chest compression technique with discussion of AED use.   Hypertension: -Group verbal and written instruction that provides a basic overview of hypertension including the most recent diagnostic guidelines, risk factor reduction with self-care instructions and medication management.    Nutrition I class: Heart Healthy Eating:  -Group instruction provided by PowerPoint slides, verbal discussion, and written materials to support subject matter. The instructor gives an explanation and review of the Therapeutic Lifestyle Changes diet recommendations, which includes a discussion on lipid goals, dietary fat, sodium, fiber, plant stanol/sterol esters, sugar, and the components of a well-balanced, healthy diet.   Nutrition II class: Lifestyle Skills:  -Group instruction provided by PowerPoint slides, verbal discussion, and written materials to support subject matter. The instructor gives an explanation and review of label reading, grocery  shopping for heart health, heart healthy recipe modifications, and ways to make healthier choices when eating out.   Diabetes Question & Answer:  -Group instruction provided by PowerPoint slides, verbal discussion, and written materials to support subject matter. The instructor gives an explanation and review of diabetes co-morbidities, pre- and post-prandial blood glucose goals, pre-exercise blood glucose goals, signs, symptoms, and treatment of hypoglycemia and hyperglycemia, and foot care basics.   Diabetes Blitz:  -Group instruction provided by PowerPoint slides, verbal discussion, and written materials to support subject matter. The instructor gives an explanation and review of the physiology behind type 1 and type 2 diabetes, diabetes medications and rational behind using different medications, pre- and post-prandial blood glucose recommendations and Hemoglobin A1c goals, diabetes diet, and exercise including blood glucose guidelines for exercising safely.    Portion Distortion:  -Group instruction provided by PowerPoint slides, verbal discussion, written materials, and food models to support subject matter. The instructor gives an explanation of serving size versus portion size, changes in portions sizes over the last 20 years, and what consists of a serving from each food group.   Stress Management:  -Group instruction provided by verbal instruction, video, and written materials to support subject matter.  Instructors review role of stress in heart disease and how to cope with stress positively.     Exercising on Your Own:  -Group instruction provided by verbal instruction, power point, and written materials to support subject.  Instructors discuss benefits of exercise, components of exercise, frequency and intensity of exercise, and end points for exercise.  Also discuss use of nitroglycerin and activating EMS.  Review options of places to exercise outside of rehab.  Review guidelines  for sex with heart disease.   Cardiac Drugs I:  -Group instruction provided by verbal instruction and written materials to support subject.  Instructor reviews cardiac drug classes: antiplatelets, anticoagulants, beta blockers, and statins.  Instructor discusses reasons, side effects, and lifestyle considerations for each drug class.   Cardiac Drugs II:  -Group instruction provided by verbal instruction and written materials to support subject.  Instructor reviews cardiac drug classes: angiotensin converting enzyme inhibitors (ACE-I), angiotensin II receptor blockers (ARBs), nitrates, and calcium channel blockers.  Instructor discusses reasons, side effects, and lifestyle considerations for each drug class.   Anatomy and Physiology of the Circulatory System:  Group verbal and written instruction and models provide basic cardiac anatomy and physiology, with the coronary electrical and arterial systems. Review of: AMI, Angina, Valve disease, Heart Failure, Peripheral Artery Disease, Cardiac Arrhythmia, Pacemakers,  and the ICD.   Other Education:  -Group or individual verbal, written, or video instructions that support the educational goals of the cardiac rehab program.   Holiday Eating Survival Tips:  -Group instruction provided by PowerPoint slides, verbal discussion, and written materials to support subject matter. The instructor gives patients tips, tricks, and techniques to help them not only survive but enjoy the holidays despite the onslaught of food that accompanies the holidays.   Knowledge Questionnaire Score: Knowledge Questionnaire Score - 03/11/18 0955    Knowledge Questionnaire Score          Pre Score  21/24           Core Components/Risk Factors/Patient Goals at Admission: Personal Goals and Risk Factors at Admission - 03/11/18 1001    Core Components/Risk Factors/Patient Goals on Admission           Weight Management  Yes    Intervention  Weight Management: Develop  a combined nutrition and exercise program designed to reach desired caloric intake, while maintaining appropriate intake of nutrient and fiber, sodium and fats, and appropriate energy expenditure required for the weight goal.;Weight Management: Provide education and appropriate resources to help participant work on and attain dietary goals.;Weight Management/Obesity: Establish reasonable short term and long term weight goals.    Admit Weight  191 lb 12.8 oz (87 kg)    Expected Outcomes  Short Term: Continue to assess and modify interventions until short term weight is achieved;Long Term: Adherence to nutrition and physical activity/exercise program aimed toward attainment of established weight goal;Weight Maintenance: Understanding of the daily nutrition guidelines, which includes 25-35% calories from fat, 7% or less cal from saturated fats, less than 200mg  cholesterol, less than 1.5gm of sodium, & 5 or more servings of fruits and vegetables daily;Understanding recommendations for meals to include 15-35% energy as protein, 25-35% energy from fat, 35-60% energy from carbohydrates, less than 200mg  of dietary cholesterol, 20-35 gm of total fiber daily;Understanding of distribution of calorie intake throughout the day with the consumption of 4-5 meals/snacks;Weight Loss: Understanding of general recommendations for a balanced deficit meal plan, which promotes 1-2 lb weight loss per week and includes a negative energy balance of [phone] kcal/d    Hypertension  Yes    Intervention  Provide education on lifestyle modifcations including regular physical activity/exercise, weight management, moderate sodium restriction and increased consumption of fresh fruit, vegetables, and low fat dairy, alcohol moderation, and smoking cessation.;Monitor prescription use compliance.    Expected Outcomes  Short Term: Continued assessment and intervention until BP is < 140/51mm HG in hypertensive participants. < 130/31mm HG in  hypertensive participants with diabetes, heart failure or chronic kidney disease.;Long Term: Maintenance of blood pressure at goal levels.    Lipids  Yes    Intervention  Provide education and support for participant on nutrition & aerobic/resistive exercise along with prescribed medications to achieve LDL 70mg , HDL >40mg .    Expected Outcomes  Short Term: Participant states understanding of desired cholesterol values and is compliant with medications prescribed. Participant is following exercise prescription and nutrition guidelines.;Long Term: Cholesterol controlled with medications as prescribed, with individualized exercise RX and with personalized nutrition plan. Value goals: LDL < 70mg , HDL > 40 mg.    Stress  Yes    Intervention  Offer individual and/or small group education and counseling on adjustment to heart disease, stress management and health-related lifestyle change. Teach and support self-help strategies.;Refer participants experiencing significant psychosocial distress to appropriate mental health specialists for further evaluation and  treatment. When possible, include family members and significant others in education/counseling sessions.    Expected Outcomes  Short Term: Participant demonstrates changes in health-related behavior, relaxation and other stress management skills, ability to obtain effective social support, and compliance with psychotropic medications if prescribed.;Long Term: Emotional wellbeing is indicated by absence of clinically significant psychosocial distress or social isolation.           Core Components/Risk Factors/Patient Goals Review:    Core Components/Risk Factors/Patient Goals at Discharge (Final Review):    ITP Comments: ITP Comments    Row Name 03/11/18 0910   ITP Comments  Dr. Fransico Him, Medical Director       Comments: Patient attended orientation from [phone] to [phone] to review rules and guidelines for program. Completed 6 minute walk  test, Intitial ITP, and exercise prescription.  Telemetry-sinus brady -48, pt  Asymptomatic.  Dr. Martinique made aware.  Pt reports he notes HR 50-60 on his fitbit at home.  Otherwise VSS.  Andi Hence, RN, BSN Cardiac Pulmonary Rehab 03/11/18 11:02 AM

## 2018-03-11 NOTE — Telephone Encounter (Signed)
-----   Message from Peter M Martinique, MD sent at 03/11/2018  1:13 PM EST ----- Regarding: RE: cardiac rehab If he is asymptomatic I would just continue to monitor. HR at last 2 OV looked good. Let me know if HR is sustained less than 50  Peter Martinique MD, [facility]  ----- Message ----- From: Lowell Guitar, RN Sent: 03/11/2018  11:55 AM EST To: Peter M Martinique, MD Subject: cardiac rehab                                  Dear Dr. Martinique, [place] attended cardiac rehab orientation today. Pt tolerated 6 min walk test without difficulty.  Pt noted to be in sinus brady at rest. Lowest rate 48.   Pt asymptomatic.  Pt states he notes HR -50-60s at home at rest.  Is this expected for him?  Please advise low HR parameters.    Thank you, Andi Hence, RN, BSN Cardiac Pulmonary Rehab

## 2018-03-13 NOTE — Progress Notes (Signed)
The patient has been notified of the result and verbalized understanding.  All questions (if any) were answered. [name], [place] 03/13/2018 2:07 PM

## 2018-03-17 ENCOUNTER — Encounter (HOSPITAL_COMMUNITY)
Admission: RE | Admit: 2018-03-17 | Discharge: 2018-03-17 | Disposition: A | Discharge status: Home / Self Care | Payer: Managed Care, Other (non HMO) | Source: Ambulatory Visit | Attending: Cardiology | Admitting: Cardiology

## 2018-03-17 ENCOUNTER — Ambulatory Visit (HOSPITAL_COMMUNITY): Payer: Self-pay

## 2018-03-17 DIAGNOSIS — Z951 Presence of aortocoronary bypass graft: Secondary | ICD-10-CM | POA: Diagnosis present

## 2018-03-17 DIAGNOSIS — I214 Non-ST elevation (NSTEMI) myocardial infarction: Secondary | ICD-10-CM | POA: Diagnosis not present

## 2018-03-17 NOTE — Progress Notes (Signed)
Daily Session Note  Patient Details  Name: Paul Duncan MRN: [id number] Date of Birth: 04-21-1975 Referring Provider:   Flowsheet Row CARDIAC REHAB PHASE II ORIENTATION from 03/11/2018 in [place]  Referring Provider  Dr. Martinique      Encounter Date: 03/17/2018  Check In: Session Check In - 03/17/18 0859    Check-In          Supervising physician immediately available to respond to emergencies  [facility] immediately available    Physician(s)  Dr. Tana Coast    Location  [facility]    Staff Present  Andi Hence, RN, Marga Melnick, RN, Mosie Epstein, MS,ACSM CEP, Exercise Physiologist;Tara Karle Starch, RN, BSN    Medication changes reported      No    Fall or balance concerns reported     No    Tobacco Cessation  No Change    Warm-up and Cool-down  Performed as group-led instruction    Resistance Training Performed  Yes    VAD Patient?  No    PAD/SET Patient?  No        Pain Assessment          Currently in Pain?  No/denies           Capillary Blood Glucose: No results found for this or any previous visit (from the past 24 hour(s)).  Exercise Prescription Changes - 03/17/18 0900    Response to Exercise          Blood Pressure (Admit)  118/70    Blood Pressure (Exercise)  128/80    Blood Pressure (Exit)  102/60    Heart Rate (Admit)  66 bpm    Heart Rate (Exercise)  97 bpm    Heart Rate (Exit)  61 bpm    Rating of Perceived Exertion (Exercise)  12    Perceived Dyspnea (Exercise)  0    Symptoms  None    Comments  Pt oriented to exercise equipment    Duration  Progress to 30 minutes of  aerobic without signs/symptoms of physical distress    Intensity  THRR unchanged        Progression          Progression  Continue to progress workloads to maintain intensity without signs/symptoms of physical distress.    Average METs  3.48        Resistance Training          Training Prescription  Yes    Weight   4lbs    Reps  10-15    Time  10 Minutes        Interval Training          Interval Training  No        Bike          Level  1.2    Minutes  10    METs  3.6        NuStep          Level  4    SPM  85    Minutes  10    METs  3.4        Track          Laps  14    Minutes  10    METs  3.45           Social History   Tobacco Use  Smoking Status Never Smoker  Smokeless Tobacco Never Used  Goals Met:  Exercise tolerated well  Goals Unmet:  Not Applicable  Comments:  Pt started cardiac rehab today.  Pt tolerated light exercise without difficulty. VSS, telemetry-sinus rhythm, asymptomatic.  Medication list reconciled. Pt denies barriers to medicaiton compliance.  PSYCHOSOCIAL ASSESSMENT:  PHQ-0. No psychosocial needs identified, no interventions necessary.   Pt oriented to exercise equipment and routine.    Understanding verbalized.    Dr. Fransico Him is Medical Director for Cardiac Rehab at [facility].

## 2018-03-19 ENCOUNTER — Encounter (HOSPITAL_COMMUNITY)
Admission: RE | Admit: 2018-03-19 | Discharge: 2018-03-19 | Disposition: A | Discharge status: Home / Self Care | Payer: Managed Care, Other (non HMO) | Source: Ambulatory Visit | Attending: Cardiology | Admitting: Cardiology

## 2018-03-19 ENCOUNTER — Ambulatory Visit (HOSPITAL_COMMUNITY): Payer: Self-pay

## 2018-03-19 DIAGNOSIS — Z951 Presence of aortocoronary bypass graft: Secondary | ICD-10-CM

## 2018-03-19 DIAGNOSIS — I214 Non-ST elevation (NSTEMI) myocardial infarction: Secondary | ICD-10-CM | POA: Diagnosis not present

## 2018-03-19 NOTE — Progress Notes (Signed)
Cardiac Individual Treatment Plan  Patient Details  Name: Paul Duncan MRN: [id number] Date of Birth: [birth year] Referring Provider:   Flowsheet Row CARDIAC REHAB PHASE II ORIENTATION from 03/11/2018 in [facility]  Referring Provider  Dr. Martinique      Initial Encounter Date:  Flowsheet Row CARDIAC REHAB PHASE II ORIENTATION from 03/11/2018 in [facility]  Date  03/11/18      Visit Diagnosis: 12/23/2017 S/P CABG (coronary artery bypass graft)  12/19/2017 NSTEMI (non-ST elevated myocardial infarction) ([place])  Patient's Home Medications on Admission:  Current Outpatient Medications:  .  aspirin EC 325 MG EC tablet, Take 1 tablet (325 mg total) by mouth daily., Disp: 30 tablet, Rfl: 0 .  atorvastatin (LIPITOR) 80 MG tablet, Take 1 tablet (80 mg total) by mouth daily at 6 PM., Disp: 30 tablet, Rfl: 1 .  metoprolol tartrate (LOPRESSOR) 50 MG tablet, Take 25 mg by mouth 2 (two) times daily. Take half a tablet by mouth twice daily., Disp: , Rfl:   Past Medical History: Past Medical History:  Diagnosis Date  . Brain tumor ([place])   . Hypertension   . Seizures ([place])     Tobacco Use: Social History   Tobacco Use  Smoking Status Never Smoker  Smokeless Tobacco Never Used    Labs: Recent Review Flowsheet Data    Labs for ITP Cardiac and Pulmonary Rehab Latest Ref Rng & Units 12/23/2017 12/23/2017 12/23/2017 12/24/2017 03/10/2018   Cholestrol 100 - 199 mg/dL - - - - 120   LDLCALC 0 - 99 mg/dL - - - - 65   HDL >39 mg/dL - - - - 31(L)   Trlycerides 0 - 149 mg/dL - - - - 120   Hemoglobin A1c 4.8 - 5.6 % - - - - -   PHART 7.350 - 7.450 7.359 7.326(L) - - -   PCO2ART 32.0 - 48.0 mmHg 37.1 39.9 - - -   HCO3 20.0 - 28.0 mmol/L 20.9 20.8 - - -   TCO2 22 - 32 mmol/L 22 22 22 22  -   ACIDBASEDEF 0.0 - 2.0 mmol/L 4.0(H) 5.0(H) - - -   O2SAT % 94.0 89.0 - 68.1 -      Capillary Blood Glucose: Lab Results  Component  Value Date   GLUCAP 89 12/28/2017   GLUCAP 86 12/28/2017   GLUCAP 91 12/27/2017   GLUCAP 80 12/27/2017   GLUCAP 76 12/27/2017     Exercise Target Goals: Exercise Program Goal: Individual exercise prescription set using results from initial 6 min walk test and THRR while considering  patient's activity barriers and safety.   Exercise Prescription Goal: Initial exercise prescription builds to 30-45 minutes a day of aerobic activity, 2-3 days per week.  Home exercise guidelines will be given to patient during program as part of exercise prescription that the participant will acknowledge.  Activity Barriers & Risk Stratification: Activity Barriers & Cardiac Risk Stratification - 03/11/18 1006    Activity Barriers & Cardiac Risk Stratification          Activity Barriers  None    Cardiac Risk Stratification  High           6 Minute Walk: 6 Minute Walk    6 Minute Walk    Row Name 03/11/18 1003   Phase  Initial   Distance  1573 feet   Walk Time  6 minutes   # of Rest Breaks  0   MPH  2.9   METS  4.43   RPE  12   VO2 Peak  15.51   Symptoms  No   Resting HR  88 bpm   Resting BP  128/80   Resting Oxygen Saturation   100 %   Exercise Oxygen Saturation  during 6 min walk  98 %   Max Ex. HR  84 bpm   Max Ex. BP  132/78   2 Minute Post BP  118/68          Oxygen Initial Assessment:   Oxygen Re-Evaluation:   Oxygen Discharge (Final Oxygen Re-Evaluation):   Initial Exercise Prescription: Initial Exercise Prescription - 03/11/18 1000    Date of Initial Exercise RX and Referring Provider          Date  03/11/18    Referring Provider  Dr. Martinique    Expected Discharge Date  06/20/18        Bike          Level  1.2    Minutes  10    METs  3.66        NuStep          Level  4    SPM  85    Minutes  10    METs  4.4        Track          Laps  10    Minutes  10    METs  2.76        Prescription Details          Frequency (times per week)  3     Duration  Progress to 30 minutes of continuous aerobic without signs/symptoms of physical distress        Intensity          THRR 40-80% of Max Heartrate  70-141    Ratings of Perceived Exertion  11-13        Progression          Progression  Continue to progress workloads to maintain intensity without signs/symptoms of physical distress.        Resistance Training          Training Prescription  Yes    Weight  4 lbs.    Reps  10-15           Perform Capillary Blood Glucose checks as needed.  Exercise Prescription Changes: Exercise Prescription Changes    Response to Exercise    Row Name 03/17/18 0900   Blood Pressure (Admit)  118/70   Blood Pressure (Exercise)  128/80   Blood Pressure (Exit)  102/60   Heart Rate (Admit)  66 bpm   Heart Rate (Exercise)  97 bpm   Heart Rate (Exit)  61 bpm   Rating of Perceived Exertion (Exercise)  12   Perceived Dyspnea (Exercise)  0   Symptoms  None   Comments  Pt oriented to exercise equipment   Duration  Progress to 30 minutes of  aerobic without signs/symptoms of physical distress   Intensity  THRR unchanged       Progression    Row Name 03/17/18 0900   Progression  Continue to progress workloads to maintain intensity without signs/symptoms of physical distress.   Average METs  3.48       Resistance Training    Row Name 03/17/18 0900   Training Prescription  Yes   Weight  4lbs   Reps  10-15   Time  [place] Name 03/17/18 0900   Interval Training  No       Bike    Row Name 03/17/18 0900   Level  1.2   Minutes  10   METs  3.6       NuStep    Row Name 03/17/18 0900   Level  4   SPM  85   Minutes  10   METs  3.4       Track    Row Name 03/17/18 0900   Laps  14   Minutes  10   METs  3.45          Exercise Comments: Exercise Comments    Row Name 03/17/18 0936   Exercise Comments  Pt's first day of exercise. Pt oriented to exercise equipment. Pt responded well to  workloads. Will continue to monitor and progress pt.      Exercise Goals and Review: Exercise Goals    Exercise Goals    Row Name 03/11/18 1006   Increase Physical Activity  Yes   Intervention  Provide advice, education, support and counseling about physical activity/exercise needs.;Develop an individualized exercise prescription for aerobic and resistive training based on initial evaluation findings, risk stratification, comorbidities and participant's personal goals.   Expected Outcomes  Short Term: Attend rehab on a regular basis to increase amount of physical activity.   Increase Strength and Stamina  Yes   Intervention  Provide advice, education, support and counseling about physical activity/exercise needs.;Develop an individualized exercise prescription for aerobic and resistive training based on initial evaluation findings, risk stratification, comorbidities and participant's personal goals.   Expected Outcomes  Short Term: Increase workloads from initial exercise prescription for resistance, speed, and METs.   Able to understand and use rate of perceived exertion (RPE) scale  Yes   Intervention  Provide education and explanation on how to use RPE scale   Expected Outcomes  Short Term: Able to use RPE daily in rehab to express subjective intensity level;Long Term:  Able to use RPE to guide intensity level when exercising independently   Knowledge and understanding of Target Heart Rate Range (THRR)  Yes   Intervention  Provide education and explanation of THRR including how the numbers were predicted and where they are located for reference   Expected Outcomes  Short Term: Able to state/look up THRR;Long Term: Able to use THRR to govern intensity when exercising independently;Short Term: Able to use daily as guideline for intensity in rehab   Able to check pulse independently  Yes   Intervention  Provide education and demonstration on how to check pulse in carotid and radial  arteries.;Review the importance of being able to check your own pulse for safety during independent exercise   Expected Outcomes  Short Term: Able to explain why pulse checking is important during independent exercise;Long Term: Able to check pulse independently and accurately   Understanding of Exercise Prescription  Yes   Intervention  Provide education, explanation, and written materials on patient's individual exercise prescription   Expected Outcomes  Short Term: Able to explain program exercise prescription;Long Term: Able to explain home exercise prescription to exercise independently          Exercise Goals Re-Evaluation :   Discharge Exercise Prescription (Final Exercise Prescription Changes): Exercise Prescription Changes - 03/17/18 0900    Response to Exercise          Blood Pressure (Admit)  118/70    Blood Pressure (Exercise)  128/80    Blood Pressure (Exit)  102/60    Heart Rate (Admit)  66 bpm    Heart Rate (Exercise)  97 bpm    Heart Rate (Exit)  61 bpm    Rating of Perceived Exertion (Exercise)  12    Perceived Dyspnea (Exercise)  0    Symptoms  None    Comments  Pt oriented to exercise equipment    Duration  Progress to 30 minutes of  aerobic without signs/symptoms of physical distress    Intensity  THRR unchanged        Progression          Progression  Continue to progress workloads to maintain intensity without signs/symptoms of physical distress.    Average METs  3.48        Resistance Training          Training Prescription  Yes    Weight  4lbs    Reps  10-15    Time  10 Minutes        Interval Training          Interval Training  No        Bike          Level  1.2    Minutes  10    METs  3.6        NuStep          Level  4    SPM  85    Minutes  10    METs  3.4        Track          Laps  14    Minutes  10    METs  3.45           Nutrition:  Target Goals: Understanding of nutrition guidelines, daily intake of sodium  1500mg , cholesterol 200mg , calories 30% from fat and 7% or less from saturated fats, daily to have 5 or more servings of fruits and vegetables.  Biometrics: Pre Biometrics - 03/11/18 1007    Pre Biometrics          Height  5\' 6"  (1.676 m)    Weight  87 kg    Waist Circumference  40.5 inches    Hip Circumference  41.5 inches    Waist to Hip Ratio  0.98 %    BMI (Calculated)  30.97    Triceps Skinfold  33 mm    % Body Fat  31.7 %    Grip Strength  33 kg    Flexibility  14 in    Single Leg Stand  1.65 seconds            Nutrition Therapy Plan and Nutrition Goals:   Nutrition Assessments:   Nutrition Goals Re-Evaluation:   Nutrition Goals Re-Evaluation:   Nutrition Goals Discharge (Final Nutrition Goals Re-Evaluation):   Psychosocial: Target Goals: Acknowledge presence or absence of significant depression and/or stress, maximize coping skills, provide positive support system. Participant is able to verbalize types and ability to use techniques and skills needed for reducing stress and depression.  Initial Review & Psychosocial Screening: Initial Psych Review & Screening - 03/11/18 1052    Initial Review          Current issues with  Current Stress Concerns        Barriers          Psychosocial barriers to participate in program  There are no identifiable barriers or psychosocial needs.        Screening Interventions          Interventions  Encouraged to exercise           Quality of Life Scores: Quality of Life - 03/11/18 1000    Quality of Life          Select  Quality of Life        Quality of Life Scores          Health/Function Pre  24.11 %    Socioeconomic Pre  22.71 %    Psych/Spiritual Pre  23.57 %    Family Pre  23.5 %    GLOBAL Pre  23.61 %          Scores of 19 and below usually indicate a poorer quality of life in these areas.  A difference of  2-3 points is a clinically meaningful difference.  A difference of 2-3 points in the  total score of the Quality of Life Index has been associated with significant improvement in overall quality of life, self-image, physical symptoms, and general health in studies assessing change in quality of life.  PHQ-9: Recent Review Flowsheet Data    Depression screen [facility] 2/9 03/17/2018   Decreased Interest 0   Down, Depressed, Hopeless 0   PHQ - 2 Score 0     Interpretation of Total Score  Total Score Depression Severity:  1-4 = Minimal depression, 5-9 = Mild depression, 10-14 = Moderate depression, 15-19 = Moderately severe depression, 20-27 = Severe depression   Psychosocial Evaluation and Intervention: Psychosocial Evaluation - 03/17/18 0938    Psychosocial Evaluation & Interventions          Interventions  Stress management education;Relaxation education;Encouraged to exercise with the program and follow exercise prescription    Comments  pt with current stressors, demonstrates positive outlook with good coping skills. pt enjoys outdoor activities including sail boat on [employer].      Expected Outcomes  pt will exhibit improved stress management maintaining positive outlook  and coping skills.     Continue Psychosocial Services   Follow up required by staff           Psychosocial Re-Evaluation:   Psychosocial Discharge (Final Psychosocial Re-Evaluation):   Vocational Rehabilitation: Provide vocational rehab assistance to qualifying candidates.   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 Participant will state understanding/return demonstration of topics presented.  Learning Barriers/Preferences: Learning Barriers/Preferences - 03/11/18 1000    Learning  Barriers/Preferences          Learning Barriers  Sight    Learning Preferences  Video;Written Material;Skilled Demonstration           Education Topics: Count Your Pulse:  -Group instruction provided by verbal instruction, demonstration, patient participation and written materials to support subject.  Instructors address importance of being able to find your pulse and how to count your pulse when at home without a heart monitor.  Patients get hands on experience counting their pulse with staff help and individually.   Heart Attack, Angina, and Risk Factor Modification:  -Group instruction provided  by verbal instruction, video, and written materials to support subject.  Instructors address signs and symptoms of angina and heart attacks.    Also discuss risk factors for heart disease and how to make changes to improve heart health risk factors.   Functional Fitness:  -Group instruction provided by verbal instruction, demonstration, patient participation, and written materials to support subject.  Instructors address safety measures for doing things around the house.  Discuss how to get up and down off the floor, how to pick things up properly, how to safely get out of a chair without assistance, and balance training.   Meditation and Mindfulness:  -Group instruction provided by verbal instruction, patient participation, and written materials to support subject.  Instructor addresses importance of mindfulness and meditation practice to help reduce stress and improve awareness.  Instructor also leads participants through a meditation exercise.    Stretching for Flexibility and Mobility:  -Group instruction provided by verbal instruction, patient participation, and written materials to support subject.  Instructors lead participants through series of stretches that are designed to increase flexibility thus improving mobility.  These stretches are additional exercise for major muscle groups that  are typically performed during regular warm up and cool down.   Hands Only CPR:  -Group verbal, video, and participation provides a basic overview of AHA guidelines for community CPR. Role-play of emergencies allow participants the opportunity to practice calling for help and chest compression technique with discussion of AED use.   Hypertension: -Group verbal and written instruction that provides a basic overview of hypertension including the most recent diagnostic guidelines, risk factor reduction with self-care instructions and medication management.    Nutrition I class: Heart Healthy Eating:  -Group instruction provided by PowerPoint slides, verbal discussion, and written materials to support subject matter. The instructor gives an explanation and review of the Therapeutic Lifestyle Changes diet recommendations, which includes a discussion on lipid goals, dietary fat, sodium, fiber, plant stanol/sterol esters, sugar, and the components of a well-balanced, healthy diet.   Nutrition II class: Lifestyle Skills:  -Group instruction provided by PowerPoint slides, verbal discussion, and written materials to support subject matter. The instructor gives an explanation and review of label reading, grocery shopping for heart health, heart healthy recipe modifications, and ways to make healthier choices when eating out.   Diabetes Question & Answer:  -Group instruction provided by PowerPoint slides, verbal discussion, and written materials to support subject matter. The instructor gives an explanation and review of diabetes co-morbidities, pre- and post-prandial blood glucose goals, pre-exercise blood glucose goals, signs, symptoms, and treatment of hypoglycemia and hyperglycemia, and foot care basics.   Diabetes Blitz:  -Group instruction provided by PowerPoint slides, verbal discussion, and written materials to support subject matter. The instructor gives an explanation and review of the  physiology behind type 1 and type 2 diabetes, diabetes medications and rational behind using different medications, pre- and post-prandial blood glucose recommendations and Hemoglobin A1c goals, diabetes diet, and exercise including blood glucose guidelines for exercising safely.    Portion Distortion:  -Group instruction provided by PowerPoint slides, verbal discussion, written materials, and food models to support subject matter. The instructor gives an explanation of serving size versus portion size, changes in portions sizes over the last 20 years, and what consists of a serving from each food group.   Stress Management:  -Group instruction provided by verbal instruction, video, and written materials to support subject matter.  Instructors review role of stress in heart disease and how to  cope with stress positively.     Exercising on Your Own:  -Group instruction provided by verbal instruction, power point, and written materials to support subject.  Instructors discuss benefits of exercise, components of exercise, frequency and intensity of exercise, and end points for exercise.  Also discuss use of nitroglycerin and activating EMS.  Review options of places to exercise outside of rehab.  Review guidelines for sex with heart disease.   Cardiac Drugs I:  -Group instruction provided by verbal instruction and written materials to support subject.  Instructor reviews cardiac drug classes: antiplatelets, anticoagulants, beta blockers, and statins.  Instructor discusses reasons, side effects, and lifestyle considerations for each drug class. Flowsheet Row CARDIAC REHAB PHASE II EXERCISE from 03/19/2018 in [place] [facility]  Date  03/19/18  Instruction Review Code  2- Demonstrated Understanding      Cardiac Drugs II:  -Group instruction provided by verbal instruction and written materials to support subject.  Instructor reviews cardiac drug classes: angiotensin converting  enzyme inhibitors (ACE-I), angiotensin II receptor blockers (ARBs), nitrates, and calcium channel blockers.  Instructor discusses reasons, side effects, and lifestyle considerations for each drug class.   Anatomy and Physiology of the Circulatory System:  Group verbal and written instruction and models provide basic cardiac anatomy and physiology, with the coronary electrical and arterial systems. Review of: AMI, Angina, Valve disease, Heart Failure, Peripheral Artery Disease, Cardiac Arrhythmia, Pacemakers, and the ICD.   Other Education:  -Group or individual verbal, written, or video instructions that support the educational goals of the cardiac rehab program.   Holiday Eating Survival Tips:  -Group instruction provided by PowerPoint slides, verbal discussion, and written materials to support subject matter. The instructor gives patients tips, tricks, and techniques to help them not only survive but enjoy the holidays despite the onslaught of food that accompanies the holidays.   Knowledge Questionnaire Score: Knowledge Questionnaire Score - 03/11/18 0955    Knowledge Questionnaire Score          Pre Score  21/24           Core Components/Risk Factors/Patient Goals at Admission: Personal Goals and Risk Factors at Admission - 03/11/18 1001    Core Components/Risk Factors/Patient Goals on Admission           Weight Management  Yes    Intervention  Weight Management: Develop a combined nutrition and exercise program designed to reach desired caloric intake, while maintaining appropriate intake of nutrient and fiber, sodium and fats, and appropriate energy expenditure required for the weight goal.;Weight Management: Provide education and appropriate resources to help participant work on and attain dietary goals.;Weight Management/Obesity: Establish reasonable short term and long term weight goals.    Admit Weight  191 lb 12.8 oz (87 kg)    Expected Outcomes  Short Term: Continue to  assess and modify interventions until short term weight is achieved;Long Term: Adherence to nutrition and physical activity/exercise program aimed toward attainment of established weight goal;Weight Maintenance: Understanding of the daily nutrition guidelines, which includes 25-35% calories from fat, 7% or less cal from saturated fats, less than 200mg  cholesterol, less than 1.5gm of sodium, & 5 or more servings of fruits and vegetables daily;Understanding recommendations for meals to include 15-35% energy as protein, 25-35% energy from fat, 35-60% energy from carbohydrates, less than 200mg  of dietary cholesterol, 20-35 gm of total fiber daily;Understanding of distribution of calorie intake throughout the day with the consumption of 4-5 meals/snacks;Weight Loss: Understanding of general recommendations for a  balanced deficit meal plan, which promotes 1-2 lb weight loss per week and includes a negative energy balance of [phone] kcal/d    Hypertension  Yes    Intervention  Provide education on lifestyle modifcations including regular physical activity/exercise, weight management, moderate sodium restriction and increased consumption of fresh fruit, vegetables, and low fat dairy, alcohol moderation, and smoking cessation.;Monitor prescription use compliance.    Expected Outcomes  Short Term: Continued assessment and intervention until BP is < 140/47mm HG in hypertensive participants. < 130/82mm HG in hypertensive participants with diabetes, heart failure or chronic kidney disease.;Long Term: Maintenance of blood pressure at goal levels.    Lipids  Yes    Intervention  Provide education and support for participant on nutrition & aerobic/resistive exercise along with prescribed medications to achieve LDL 70mg , HDL >40mg .    Expected Outcomes  Short Term: Participant states understanding of desired cholesterol values and is compliant with medications prescribed. Participant is following exercise prescription and  nutrition guidelines.;Long Term: Cholesterol controlled with medications as prescribed, with individualized exercise RX and with personalized nutrition plan. Value goals: LDL < 70mg , HDL > 40 mg.    Stress  Yes    Intervention  Offer individual and/or small group education and counseling on adjustment to heart disease, stress management and health-related lifestyle change. Teach and support self-help strategies.;Refer participants experiencing significant psychosocial distress to appropriate mental health specialists for further evaluation and treatment. When possible, include family members and significant others in education/counseling sessions.    Expected Outcomes  Short Term: Participant demonstrates changes in health-related behavior, relaxation and other stress management skills, ability to obtain effective social support, and compliance with psychotropic medications if prescribed.;Long Term: Emotional wellbeing is indicated by absence of clinically significant psychosocial distress or social isolation.           Core Components/Risk Factors/Patient Goals Review:  Goals and Risk Factor Review    Core Components/Risk Factors/Patient Goals Review    Row Name 03/17/18 0939   Personal Goals Review  Weight Management/Obesity;Hypertension;Lipids;Stress   Review  pt with multiple CAD RF demonstrates eagerness to participate in CR program.  pt personal goals are to improve heart health and lose weight.    Expected Outcomes  pt will participate in CR exercise, nutrition and lifestyle modification opportunities to decrease overall RF.           Core Components/Risk Factors/Patient Goals at Discharge (Final Review):  Goals and Risk Factor Review - 03/17/18 0939    Core Components/Risk Factors/Patient Goals Review          Personal Goals Review  Weight Management/Obesity;Hypertension;Lipids;Stress    Review  pt with multiple CAD RF demonstrates eagerness to participate in CR program.  pt  personal goals are to improve heart health and lose weight.     Expected Outcomes  pt will participate in CR exercise, nutrition and lifestyle modification opportunities to decrease overall RF.            ITP Comments: ITP Comments    Row Name 03/11/18 [id number] 03/17/18 0942 03/19/18 1508   ITP Comments  Dr. Fransico Him, Medical Director   pt started group exercise session today.  pt tolerated light activity without difficulty.  pt oriented to exercise equipment and safety routine. understanding verbalized.   30 day ITP review.       Comments:

## 2018-03-21 ENCOUNTER — Encounter (HOSPITAL_COMMUNITY)
Admission: RE | Admit: 2018-03-21 | Discharge: 2018-03-21 | Disposition: A | Discharge status: Home / Self Care | Payer: Managed Care, Other (non HMO) | Source: Ambulatory Visit | Attending: Cardiology | Admitting: Cardiology

## 2018-03-21 ENCOUNTER — Ambulatory Visit (HOSPITAL_COMMUNITY): Payer: Self-pay

## 2018-03-21 DIAGNOSIS — I214 Non-ST elevation (NSTEMI) myocardial infarction: Secondary | ICD-10-CM

## 2018-03-21 DIAGNOSIS — Z951 Presence of aortocoronary bypass graft: Secondary | ICD-10-CM

## 2018-03-23 NOTE — Progress Notes (Signed)
Cardiology Office Note    Date:  03/26/2018   ID:  Paul Duncan, DOB Oct 08, 1975, MRN [id number]  PCP:  [name], PA  Cardiologist:  Dr. Martinique  Chief Complaint  Patient presents with  . Coronary Artery Disease  . Hypertension    History of Present Illness:  Paul Duncan is a 43 y.o. male with PMH of HTN, HLD and brain tumor s/p resection.  He was  admitted to the hospital on 12/18/2017 with substernal chest pain.  He has significant family history of CAD with his father suffering an MI and subsequently had bypass surgery at age 51.  Troponin on arrival to the hospital was elevated at 0.45 and subsequently trended up to 2.4.  Patient was admitted for NSTEMI.    Patient underwent cardiac catheterization on 12/18/2017 which showed EF 25 to 35%, 60% mid RCA lesion, 100% ostial OM 2 lesion, 30% ostial OM1 lesion, 100% mid LAD occlusion, 99% proximal LAD stenosis.  Bypass surgery was recommended if distal LAD territory is proven viable.  Echocardiogram obtained on the same day showed EF 40 to 45%, mild LVH, akinesis of the apical anterior and apical myocardium, akinesis of the mid anteroseptal myocardium.  MRI viability test shows there was substantial viability of the anterior wall on with only small area of scar at the apex.  Therefore the decision was made to proceed with bypass surgery.  Due to complication that a sheath for the Deberah Pelton catheter was placed in the right common carotid artery, vascular surgery was called and the patient had to undergo exploration of the right neck, repair of the right internal jugular vein, and repair of the injury to the origin of the right vertebral artery on 12/23/2017.  Patient underwent CABG x5 with LIMA to LAD, SVG to diagonal, SVG to PDA and sequential SVG to OM1 and OM 2 by Dr. Servando Snare.  Postprocedure, patient had postop anemia and volume overload.  He did not require blood transfusion.  He did require IV Lasix for treatment.  Initially  after CABG BP was running low and medication was reduced. He is active with Cardiac Rehab and walking daily. He is back working full time. He is eating healthy. No pain, dyspnea, palpitations or edema.    Past Medical History:  Diagnosis Date  . Brain tumor ([facility])   . Hypertension   . Seizures ([place])     Past Surgical History:  Procedure Laterality Date  . BRAIN SURGERY    . BRAIN SURGERY  [phone]  . CORONARY ARTERY BYPASS GRAFT N/A 12/23/2017   Procedure: CORONARY ARTERY BYPASS GRAFTING (CABG) x 5: Using Left Internal Mammary Artery (LIMA) and Endoscopically Harvested Right Thigh/Calf and Left Thigh Greater Saphenous Vein Grafts (SVG);   LIMA to LAD, SVG to Diag1, SVG to PDA, SVG to OM1 and OM2; / and Repair of Vertebral Laceration and Removal of [name] Introducer;  Surgeon: [name], MD;  Location: [facility];  Service: Open Hear  . LEFT HEART CATH AND CORONARY ANGIOGRAPHY N/A 12/18/2017   Procedure: LEFT HEART CATH AND CORONARY ANGIOGRAPHY;  Surgeon: [name], MD;  Location: [facility];  Service: Cardiovascular;  Laterality: N/A;  . TEE WITHOUT CARDIOVERSION N/A 12/23/2017   Procedure: TRANSESOPHAGEAL ECHOCARDIOGRAM (TEE);  Surgeon: [name], MD;  Location: [facility];  Service: Open Heart Surgery;  Laterality: N/A;    Current Medications: Outpatient Medications Prior to Visit  Medication Sig Dispense Refill  . aspirin EC  325 MG EC tablet Take 1 tablet (325 mg total) by mouth daily. 30 tablet 0  . atorvastatin (LIPITOR) 80 MG tablet Take 1 tablet (80 mg total) by mouth daily at 6 PM. 30 tablet 1  . metoprolol tartrate (LOPRESSOR) 50 MG tablet Take 25 mg by mouth 2 (two) times daily. Take half a tablet by mouth twice daily.     No facility-administered medications prior to visit.      Allergies:   Patient has no known allergies.   Social History   Socioeconomic History  . Marital status: Single    Spouse name: Not on file  . Number of children: Not  on file  . Years of education: Not on file  . Highest education level: Not on file  Occupational History  . Not on file  Social Needs  . Financial resource strain: Not on file  . Food insecurity:    Worry: Not on file    Inability: Not on file  . Transportation needs:    Medical: Not on file    Non-medical: Not on file  Tobacco Use  . Smoking status: Never Smoker  . Smokeless tobacco: Never Used  Substance and Sexual Activity  . Alcohol use: No  . Drug use: No  . Sexual activity: Not on file  Lifestyle  . Physical activity:    Days per week: Not on file    Minutes per session: Not on file  . Stress: Not on file  Relationships  . Social connections:    Talks on phone: Not on file    Gets together: Not on file    Attends religious service: Not on file    Active member of club or organization: Not on file    Attends meetings of clubs or organizations: Not on file    Relationship status: Not on file  Other Topics Concern  . Not on file  Social History Narrative   ** Merged History Encounter **         Family History:  The patient's family history includes Diabetes in his mother; Heart disease in his father.   ROS:   Please see the history of present illness.    ROS All other systems reviewed and are negative.   PHYSICAL EXAM:   VS:  BP 120/78   Pulse 60   Ht 5\' 6"  (1.676 m)   Wt 192 lb 6 oz (87.3 kg)   BMI 31.05 kg/m    GEN: Well nourished, well developed, in no acute distress  HEENT: normal  Neck: no JVD, carotid bruits, or masses Cardiac: RRR; no murmurs, rubs, or gallops,no edema. Sternotomy scar is healing well. Respiratory:  clear to auscultation bilaterally, normal work of breathing GI: soft, nontender, nondistended, + BS MS: no deformity or atrophy  Skin: warm and dry, no rash Neuro:  Alert and Oriented x 3, Strength and sensation are intact Psych: euthymic mood, full affect  Wt Readings from Last 3 Encounters:  03/26/18 192 lb 6 oz (87.3 kg)    03/11/18 191 lb 12.8 oz (87 kg)  03/06/18 192 lb 9.6 oz (87.4 kg)      Studies/Labs Reviewed:   EKG:  EKG is not ordered today.    Recent Labs: 12/24/2017: Magnesium 2.1 01/10/2018: BUN 14; Creatinine, Ser 0.96; Hemoglobin 11.4; Platelets 548; Potassium 4.8; Sodium 138 03/10/2018: ALT 18   Lipid Panel    Component Value Date/Time   CHOL 120 03/10/2018 0818   TRIG 120 03/10/2018 0818  HDL 31 (L) 03/10/2018 0818   CHOLHDL 3.9 03/10/2018 0818   CHOLHDL 6.1 12/18/2017 0839   VLDL 40 12/18/2017 0839   LDLCALC 65 03/10/2018 0818    Additional studies/ records that were reviewed today include:   Cath 12/18/2017  There is moderate to severe left ventricular systolic dysfunction.  LV end diastolic pressure is moderately elevated.  The left ventricular ejection fraction is 25-35% by visual estimate.  Mid RCA lesion is 60% stenosed.  Ost 2nd Mrg lesion is 100% stenosed.  Ost 1st Mrg lesion is 30% stenosed.  Mid LAD lesion is 100% stenosed.  Prox LAD lesion is 99% stenosed.   1.  Significant three-vessel coronary artery disease as outlined above. 2.  Severely reduced LV systolic function with an EF of 25 to 35%. 3.  Moderately elevated left ventricular end-diastolic pressure.  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  If the mid to distal LAD myocardium is viable, I think his best option is CABG.  If in the other hand there is no viable myocardium in that area, PCI of proximal LAD plus fractional flow reserve guided revascularization of the RCA can be considered.   Echo 12/18/2017 LV EF: 40% -   45% Study Conclusions  - Left ventricle: The cavity size was normal. There was mild   concentric hypertrophy. Systolic function was mildly to   moderately reduced. The estimated ejection fraction was in the   range of 40% to 45%. There is akinesis of the apicalanterior and    apical myocardium. There is akinesis of the midanteroseptal   myocardium. - Pulmonary arteries: Systolic pressure could not be accurately   estimated.    MR VIABILITY  FINDINGS: Limited images of the lung fields showed no gross abnormalities.  Normal left ventricular size with mild LV hypertrophy. Mid to apical anterior, anteroseptal and inferoseptal hypokinesis. Apical lateral and apical inferior hypokinesis. Severe hypokinesis of the true apex. LV EF 45%. Normal right ventricular size and systolic function, EF 24%. Normal right and left atrial sizes. Trileaflet aortic valve with no stenosis, mild aortic insufficiency. No significant mitral regurgitation noted.  Delayed enhancement imaging: Patchy <25% wall thickness subendocardial late gadolinium enhancement (LGE) in the mid to apical anterior, anteroseptal, and inferoseptal walls.  Measurements:  LVEDV 132 mL  LVSV 59 mL  LVEF 45%  RVEDV 86 mL  RVSV 51 mL  RVEF 59%  IMPRESSION: 1. Normal LV size with mild LV hypertrophy. EF 45% with wall motion abnormalities as noted above.  2. Normal RV size and systolic function, EF 40%.  3. Delayed enhancement imaging was suggestive of viability of the hypokinetic wall segments.   Vascular surgery 12/23/2017 1.  Exploration of the right neck 2.  Repair of right internal jugular vein 3.  Repair of injury to the origin of the right vertebral artery   CABG 12/23/2017 PROCEDURE:  Procedure(s): CORONARY ARTERY BYPASS GRAFTING (CABG) x 5: Using Left Internal Mammary Artery (LIMA) and Endoscopically Harvested Right Thigh/Calf and Left Thigh Greater Saphenous Vein Grafts (SVG);   LIMA to LAD, SVG to Diag1, SVG to PDA, SVG to OM1 and OM2; / and Repair of Vertebral artery injury by preop line placement  and Removal of [place] Introducer (N/A) TRANSESOPHAGEAL ECHOCARDIOGRAM (TEE) (N/A)  LIMA to LAD SVG to Diag1 SVG to PDA SVG to OM1 and OM2  ASSESSMENT:    1.  Coronary artery disease involving  coronary bypass graft of native heart without angina pectoris   2. S/P CABG x 5   3. Hyperlipidemia, unspecified hyperlipidemia type   4. Essential hypertension   5. Ischemic cardiomyopathy      PLAN:  In order of problems listed above:  1. CAD s/pNSTEMI: Found to have multivessel disease with severe disease in the LAD territory, MRI viability test showed the distal LAD territory was viable.  He underwent a CABG x5 October 2019.  He has recovered well and is asymptomatic.  Continue aspirin and statin. May reduce ASA to 81 mg daily.  2. Hypertension: well controlled on metoprolol 25 mg bid.   3    Hyperlipidemia: on Lipitor 80 mg daily.  LDL 68 which is at goal < 70.   4.    Ischemic CM- pre op EF 40-45%., volume status is good. Will update Echo. If EF still depressed would add ACEi.   Follow up in 6 months.   Medication Adjustments/Labs and Tests Ordered: Current medicines are reviewed at length with the patient today.  Concerns regarding medicines are outlined above.  Medication changes, Labs and Tests ordered today are listed in the Patient Instructions below. Patient Instructions  Reduce ASA to 81 mg daily  We will schedule you for an Echocardiogram      Signed, Vishal Sandlin Martinique, MD  03/26/2018 3:54 PM    [facility] [address], [place], [place]  [address] Phone: [phone]; Fax: [phone]

## 2018-03-24 ENCOUNTER — Encounter (HOSPITAL_COMMUNITY)
Admission: RE | Admit: 2018-03-24 | Discharge: 2018-03-24 | Disposition: A | Discharge status: Home / Self Care | Payer: Managed Care, Other (non HMO) | Source: Ambulatory Visit | Attending: Cardiology | Admitting: Cardiology

## 2018-03-24 ENCOUNTER — Ambulatory Visit (HOSPITAL_COMMUNITY): Payer: Self-pay

## 2018-03-24 DIAGNOSIS — Z951 Presence of aortocoronary bypass graft: Secondary | ICD-10-CM

## 2018-03-24 DIAGNOSIS — I214 Non-ST elevation (NSTEMI) myocardial infarction: Secondary | ICD-10-CM | POA: Diagnosis not present

## 2018-03-26 ENCOUNTER — Encounter: Payer: Self-pay | Admitting: Cardiology

## 2018-03-26 ENCOUNTER — Ambulatory Visit (INDEPENDENT_AMBULATORY_CARE_PROVIDER_SITE_OTHER): Payer: Managed Care, Other (non HMO) | Admitting: Cardiology

## 2018-03-26 ENCOUNTER — Encounter (HOSPITAL_COMMUNITY)
Admission: RE | Admit: 2018-03-26 | Discharge: 2018-03-26 | Disposition: A | Discharge status: Home / Self Care | Payer: Managed Care, Other (non HMO) | Source: Ambulatory Visit | Attending: Cardiology | Admitting: Cardiology

## 2018-03-26 ENCOUNTER — Ambulatory Visit (HOSPITAL_COMMUNITY): Payer: Self-pay

## 2018-03-26 VITALS — BP 120/78 | HR 60 | Ht 66.0 in | Wt 192.4 lb

## 2018-03-26 DIAGNOSIS — Z951 Presence of aortocoronary bypass graft: Secondary | ICD-10-CM | POA: Diagnosis not present

## 2018-03-26 DIAGNOSIS — E785 Hyperlipidemia, unspecified: Secondary | ICD-10-CM

## 2018-03-26 DIAGNOSIS — I25810 Atherosclerosis of coronary artery bypass graft(s) without angina pectoris: Secondary | ICD-10-CM | POA: Diagnosis not present

## 2018-03-26 DIAGNOSIS — I214 Non-ST elevation (NSTEMI) myocardial infarction: Secondary | ICD-10-CM | POA: Diagnosis not present

## 2018-03-26 DIAGNOSIS — I255 Ischemic cardiomyopathy: Secondary | ICD-10-CM

## 2018-03-26 DIAGNOSIS — I10 Essential (primary) hypertension: Secondary | ICD-10-CM | POA: Diagnosis not present

## 2018-03-26 NOTE — Progress Notes (Signed)
Paul Duncan 43 y.o. male Nutrition Note Spoke with pt. Nutrition Plan and Nutrition Survey goals reviewed with pt. Pt is following a Heart Healthy diet. Pt wants to lose wt. Pt has not been actively trying to lose wt. Wt loss tips reviewed (label reading, how to build a healthy plate, portion sizes, eating frequently across the day). As pt stated time was a barrier to successful eating recommended pt use a combination of quick and easy meals, meal prep, and batch cooking to be successful in his weight loss journey. Discussed how to batch cook/ meal prep and distributed recipes to pt to help. Additionally discussed quick grab and go snacks to help manage hunger between meals. Per discussion, pt does not use canned/convenience foods often. Pt does not add salt to food. Pt does not eat out frequently. Pt expressed understanding of the information reviewed. Pt aware of nutrition education classes offered and does not plan on attending nutrition classes.  Lab Results  Component Value Date   HGBA1C 5.3 12/18/2017    Wt Readings from Last 3 Encounters:  03/11/18 191 lb 12.8 oz (87 kg)  03/06/18 192 lb 9.6 oz (87.4 kg)  01/23/18 198 lb (89.8 kg)    Nutrition Diagnosis ? Food-and nutrition-related knowledge deficit related to lack of exposure to information as related to diagnosis of: ? CVD  Nutrition Intervention ? Pt's individual nutrition plan reviewed with pt. ? Benefits of adopting Heart Healthy diet discussed when [employer] reviewed. ? Pt given handouts for: ? Heart healthy recipes  Goal(s) ? Pt to identify and limit food sources of saturated fat, trans fat, refined carbohydrates and sodium ? Pt to identify food quantities necessary to achieve weight loss of 6-24 lb at graduation from cardiac rehab.  ? Pt to meal prep and plan ahead of time/ batch cook  Plan:   Pt to attend nutrition classes ? Nutrition I ? Nutrition II ? Portion Distortion   Will provide client-centered nutrition  education as part of interdisciplinary care  Monitor and evaluate progress toward nutrition goal with team.    Laurina Bustle, MS, RD, LDN 03/26/2018 10:43 AM

## 2018-03-26 NOTE — Patient Instructions (Signed)
Reduce ASA to 81 mg daily  We will schedule you for an Echocardiogram

## 2018-03-28 ENCOUNTER — Ambulatory Visit (HOSPITAL_COMMUNITY): Payer: Self-pay

## 2018-03-28 ENCOUNTER — Encounter (HOSPITAL_COMMUNITY)
Admission: RE | Admit: 2018-03-28 | Discharge: 2018-03-28 | Disposition: A | Discharge status: Home / Self Care | Payer: Managed Care, Other (non HMO) | Source: Ambulatory Visit | Attending: Cardiology | Admitting: Cardiology

## 2018-03-28 DIAGNOSIS — I214 Non-ST elevation (NSTEMI) myocardial infarction: Secondary | ICD-10-CM | POA: Diagnosis not present

## 2018-03-28 DIAGNOSIS — Z951 Presence of aortocoronary bypass graft: Secondary | ICD-10-CM

## 2018-03-31 ENCOUNTER — Ambulatory Visit (HOSPITAL_COMMUNITY): Payer: Self-pay

## 2018-03-31 ENCOUNTER — Encounter (HOSPITAL_COMMUNITY)
Admission: RE | Admit: 2018-03-31 | Discharge: 2018-03-31 | Disposition: A | Discharge status: Home / Self Care | Payer: Managed Care, Other (non HMO) | Source: Ambulatory Visit | Attending: Cardiology | Admitting: Cardiology

## 2018-03-31 DIAGNOSIS — I214 Non-ST elevation (NSTEMI) myocardial infarction: Secondary | ICD-10-CM

## 2018-03-31 DIAGNOSIS — Z951 Presence of aortocoronary bypass graft: Secondary | ICD-10-CM

## 2018-04-02 ENCOUNTER — Ambulatory Visit (HOSPITAL_COMMUNITY): Payer: Self-pay

## 2018-04-02 ENCOUNTER — Encounter (HOSPITAL_COMMUNITY)
Admission: RE | Admit: 2018-04-02 | Discharge: 2018-04-02 | Disposition: A | Discharge status: Home / Self Care | Payer: Managed Care, Other (non HMO) | Source: Ambulatory Visit | Attending: Cardiology | Admitting: Cardiology

## 2018-04-02 DIAGNOSIS — I214 Non-ST elevation (NSTEMI) myocardial infarction: Secondary | ICD-10-CM

## 2018-04-02 DIAGNOSIS — Z951 Presence of aortocoronary bypass graft: Secondary | ICD-10-CM

## 2018-04-03 ENCOUNTER — Ambulatory Visit (HOSPITAL_COMMUNITY): Payer: Managed Care, Other (non HMO) | Attending: Cardiovascular Disease

## 2018-04-03 ENCOUNTER — Other Ambulatory Visit: Payer: Self-pay

## 2018-04-03 DIAGNOSIS — I25810 Atherosclerosis of coronary artery bypass graft(s) without angina pectoris: Secondary | ICD-10-CM | POA: Diagnosis not present

## 2018-04-03 DIAGNOSIS — E785 Hyperlipidemia, unspecified: Secondary | ICD-10-CM | POA: Diagnosis not present

## 2018-04-03 DIAGNOSIS — I255 Ischemic cardiomyopathy: Secondary | ICD-10-CM | POA: Diagnosis present

## 2018-04-03 DIAGNOSIS — I10 Essential (primary) hypertension: Secondary | ICD-10-CM | POA: Diagnosis not present

## 2018-04-03 DIAGNOSIS — Z951 Presence of aortocoronary bypass graft: Secondary | ICD-10-CM | POA: Diagnosis not present

## 2018-04-04 ENCOUNTER — Ambulatory Visit (HOSPITAL_COMMUNITY): Payer: Self-pay

## 2018-04-04 ENCOUNTER — Encounter (HOSPITAL_COMMUNITY)
Admission: RE | Admit: 2018-04-04 | Discharge: 2018-04-04 | Disposition: A | Discharge status: Home / Self Care | Payer: Managed Care, Other (non HMO) | Source: Ambulatory Visit | Attending: Cardiology | Admitting: Cardiology

## 2018-04-04 DIAGNOSIS — I214 Non-ST elevation (NSTEMI) myocardial infarction: Secondary | ICD-10-CM

## 2018-04-04 DIAGNOSIS — Z951 Presence of aortocoronary bypass graft: Secondary | ICD-10-CM

## 2018-04-07 ENCOUNTER — Ambulatory Visit (HOSPITAL_COMMUNITY): Payer: Self-pay

## 2018-04-07 ENCOUNTER — Encounter (HOSPITAL_COMMUNITY)
Admission: RE | Admit: 2018-04-07 | Discharge: 2018-04-07 | Disposition: A | Discharge status: Home / Self Care | Payer: Managed Care, Other (non HMO) | Source: Ambulatory Visit | Attending: Cardiology | Admitting: Cardiology

## 2018-04-07 DIAGNOSIS — I214 Non-ST elevation (NSTEMI) myocardial infarction: Secondary | ICD-10-CM

## 2018-04-07 DIAGNOSIS — Z951 Presence of aortocoronary bypass graft: Secondary | ICD-10-CM

## 2018-04-09 ENCOUNTER — Encounter (HOSPITAL_COMMUNITY)
Admission: RE | Admit: 2018-04-09 | Discharge: 2018-04-09 | Disposition: A | Discharge status: Home / Self Care | Payer: Managed Care, Other (non HMO) | Source: Ambulatory Visit | Attending: Cardiology | Admitting: Cardiology

## 2018-04-09 ENCOUNTER — Ambulatory Visit (HOSPITAL_COMMUNITY): Payer: Self-pay

## 2018-04-09 DIAGNOSIS — I214 Non-ST elevation (NSTEMI) myocardial infarction: Secondary | ICD-10-CM

## 2018-04-09 DIAGNOSIS — Z951 Presence of aortocoronary bypass graft: Secondary | ICD-10-CM

## 2018-04-11 ENCOUNTER — Ambulatory Visit (HOSPITAL_COMMUNITY): Payer: Self-pay

## 2018-04-11 ENCOUNTER — Encounter (HOSPITAL_COMMUNITY)
Admission: RE | Admit: 2018-04-11 | Discharge: 2018-04-11 | Disposition: A | Discharge status: Home / Self Care | Payer: Managed Care, Other (non HMO) | Source: Ambulatory Visit | Attending: Cardiology | Admitting: Cardiology

## 2018-04-11 DIAGNOSIS — I214 Non-ST elevation (NSTEMI) myocardial infarction: Secondary | ICD-10-CM | POA: Diagnosis not present

## 2018-04-11 DIAGNOSIS — Z951 Presence of aortocoronary bypass graft: Secondary | ICD-10-CM

## 2018-04-14 ENCOUNTER — Encounter (HOSPITAL_COMMUNITY)
Admission: RE | Admit: 2018-04-14 | Discharge: 2018-04-14 | Disposition: A | Discharge status: Home / Self Care | Payer: Managed Care, Other (non HMO) | Source: Ambulatory Visit | Attending: Cardiology | Admitting: Cardiology

## 2018-04-14 ENCOUNTER — Ambulatory Visit (HOSPITAL_COMMUNITY): Payer: Self-pay

## 2018-04-14 DIAGNOSIS — I214 Non-ST elevation (NSTEMI) myocardial infarction: Secondary | ICD-10-CM | POA: Insufficient documentation

## 2018-04-14 DIAGNOSIS — Z951 Presence of aortocoronary bypass graft: Secondary | ICD-10-CM | POA: Diagnosis present

## 2018-04-16 ENCOUNTER — Ambulatory Visit (HOSPITAL_COMMUNITY): Payer: Self-pay

## 2018-04-16 ENCOUNTER — Encounter (HOSPITAL_COMMUNITY)
Admission: RE | Admit: 2018-04-16 | Discharge: 2018-04-16 | Disposition: A | Discharge status: Home / Self Care | Payer: Managed Care, Other (non HMO) | Source: Ambulatory Visit | Attending: Cardiology | Admitting: Cardiology

## 2018-04-16 DIAGNOSIS — I214 Non-ST elevation (NSTEMI) myocardial infarction: Secondary | ICD-10-CM | POA: Diagnosis not present

## 2018-04-16 DIAGNOSIS — Z951 Presence of aortocoronary bypass graft: Secondary | ICD-10-CM

## 2018-04-17 NOTE — Progress Notes (Signed)
Cardiac Individual Treatment Plan  Patient Details  Name: Paul Duncan MRN: [id number] Date of Birth: [birth year] Referring Provider:   Flowsheet Row CARDIAC REHAB PHASE II ORIENTATION from 03/11/2018 in [facility]  Referring Provider  Dr. Martinique      Initial Encounter Date:  Flowsheet Row CARDIAC REHAB PHASE II ORIENTATION from 03/11/2018 in [facility]  Date  03/11/18      Visit Diagnosis: 12/19/2017 NSTEMI (non-ST elevated myocardial infarction) ([place])  12/23/2017 S/P CABG (coronary artery bypass graft)  Patient's Home Medications on Admission:  Current Outpatient Medications:  .  aspirin EC 325 MG EC tablet, Take 1 tablet (325 mg total) by mouth daily., Disp: 30 tablet, Rfl: 0 .  atorvastatin (LIPITOR) 80 MG tablet, Take 1 tablet (80 mg total) by mouth daily at 6 PM., Disp: 30 tablet, Rfl: 1 .  metoprolol tartrate (LOPRESSOR) 50 MG tablet, Take 25 mg by mouth 2 (two) times daily. Take half a tablet by mouth twice daily., Disp: , Rfl:   Past Medical History: Past Medical History:  Diagnosis Date  . Brain tumor ([place])   . Hypertension   . Seizures ([place])     Tobacco Use: Social History   Tobacco Use  Smoking Status Never Smoker  Smokeless Tobacco Never Used    Labs: Recent Review Flowsheet Data    Labs for ITP Cardiac and Pulmonary Rehab Latest Ref Rng & Units 12/23/2017 12/23/2017 12/23/2017 12/24/2017 03/10/2018   Cholestrol 100 - 199 mg/dL - - - - 120   LDLCALC 0 - 99 mg/dL - - - - 65   HDL >39 mg/dL - - - - 31(L)   Trlycerides 0 - 149 mg/dL - - - - 120   Hemoglobin A1c 4.8 - 5.6 % - - - - -   PHART 7.350 - 7.450 7.359 7.326(L) - - -   PCO2ART 32.0 - 48.0 mmHg 37.1 39.9 - - -   HCO3 20.0 - 28.0 mmol/L 20.9 20.8 - - -   TCO2 22 - 32 mmol/L _0 -   ACIDBASEDEF 0.0 - 2.0 mmol/L 4.0(H) 5.0(H) - - -   O2SAT % 94.0 89.0 - 68.1 -      Capillary Blood Glucose: Lab Results  Component  Value Date   GLUCAP 89 12/28/2017   GLUCAP 86 12/28/2017   GLUCAP 91 12/27/2017   GLUCAP 80 12/27/2017   GLUCAP 76 12/27/2017     Exercise Target Goals: Exercise Program Goal: Individual exercise prescription set using results from initial 6 min walk test and THRR while considering  patient's activity barriers and safety.   Exercise Prescription Goal: Initial exercise prescription builds to 30-45 minutes a day of aerobic activity, 2-3 days per week.  Home exercise guidelines will be given to patient during program as part of exercise prescription that the participant will acknowledge.  Activity Barriers & Risk Stratification: Activity Barriers & Cardiac Risk Stratification - 03/11/18 1006    Activity Barriers & Cardiac Risk Stratification          Activity Barriers  None    Cardiac Risk Stratification  High           6 Minute Walk: 6 Minute Walk    6 Minute Walk    Row Name 03/11/18 1003   Phase  Initial   Distance  1573 feet   Walk Time  6 minutes   # of Rest Breaks  0   MPH  2.9   METS  4.43   RPE  12   VO2 Peak  15.51   Symptoms  No   Resting HR  88 bpm   Resting BP  128/80   Resting Oxygen Saturation   100 %   Exercise Oxygen Saturation  during 6 min walk  98 %   Max Ex. HR  84 bpm   Max Ex. BP  132/78   2 Minute Post BP  118/68          Oxygen Initial Assessment:   Oxygen Re-Evaluation:   Oxygen Discharge (Final Oxygen Re-Evaluation):   Initial Exercise Prescription: Initial Exercise Prescription - 03/11/18 1000    Date of Initial Exercise RX and Referring Provider          Date  03/11/18    Referring Provider  Dr. Martinique    Expected Discharge Date  06/20/18        Bike          Level  1.2    Minutes  10    METs  3.66        NuStep          Level  4    SPM  85    Minutes  10    METs  4.4        Track          Laps  10    Minutes  10    METs  2.76        Prescription Details          Frequency (times per week)  3     Duration  Progress to 30 minutes of continuous aerobic without signs/symptoms of physical distress        Intensity          THRR 40-80% of Max Heartrate  70-141    Ratings of Perceived Exertion  11-13        Progression          Progression  Continue to progress workloads to maintain intensity without signs/symptoms of physical distress.        Resistance Training          Training Prescription  Yes    Weight  4 lbs.    Reps  10-15           Perform Capillary Blood Glucose checks as needed.  Exercise Prescription Changes: Exercise Prescription Changes    Response to Exercise    Row Name 03/17/18 0900 03/31/18 0900 04/14/18 1000   Blood Pressure (Admit)  118/70  no documentation  104/72   Blood Pressure (Exercise)  128/80  no documentation  142/70   Blood Pressure (Exit)  102/60  no documentation  104/62   Heart Rate (Admit)  66 bpm  no documentation  64 bpm   Heart Rate (Exercise)  97 bpm  no documentation  112 bpm   Heart Rate (Exit)  61 bpm  no documentation  74 bpm   Rating of Perceived Exertion (Exercise)  12  no documentation  12   Perceived Dyspnea (Exercise)  0  no documentation  0   Symptoms  None  no documentation  None   Comments  Pt oriented to exercise equipment  no documentation  no documentation   Duration  Progress to 30 minutes of  aerobic without signs/symptoms of physical distress  no documentation  Progress to 30 minutes of  aerobic without signs/symptoms of physical distress  Intensity  THRR unchanged  no documentation  THRR unchanged       Progression    Row Name 03/17/18 0900 03/31/18 0900 04/14/18 1000   Progression  Continue to progress workloads to maintain intensity without signs/symptoms of physical distress.  no documentation  Continue to progress workloads to maintain intensity without signs/symptoms of physical distress.   Average METs  3.48  no documentation  4.6       Resistance Training    Row Name 03/17/18 0900 03/31/18 0900  04/14/18 1000   Training Prescription  Yes  no documentation  Yes   Weight  4lbs  no documentation  5 lbs.    Reps  10-15  no documentation  10-15   Time  10 Minutes  no documentation  10 Minutes       Interval Training    Row Name 03/17/18 0900 03/31/18 0900 04/14/18 1000   Interval Training  No  no documentation  No       Bike    Row Name 03/17/18 0900 03/31/18 0900 04/14/18 1000   Level  1.2  no documentation  1.2   Minutes  10  no documentation  10   METs  3.6  no documentation  3.65       NuStep    Row Name 03/17/18 0900 03/31/18 0900 04/14/18 1000   Level  4  no documentation  5   SPM  85  no documentation  85   Minutes  10  no documentation  10   METs  3.4  no documentation  6.4       Track    Row Name 03/17/18 0900 03/31/18 0900 04/14/18 1000   Laps  14  no documentation  16   Minutes  10  no documentation  10   METs  3.45  no documentation  3.78       Home Exercise Plan    [place] Name 03/17/18 0900 03/31/18 0900 04/14/18 1000   Plans to continue exercise at  no documentation  Home (comment)  Home (comment)   Frequency  no documentation  Add 4 additional days to program exercise sessions.  Add 4 additional days to program exercise sessions.   Initial Home Exercises Provided  no documentation  03/31/18  03/31/18          Exercise Comments: Exercise Comments    Row Name 03/17/18 0936 03/31/18 0951 04/16/18 1006   Exercise Comments  Pt's first day of exercise. Pt oriented to exercise equipment. Pt responded well to workloads. Will continue to monitor and progress pt.  Reviewed HEP with Pt. Pt was responsive and understands goals.   Reviewed METs and goals with Pt. Pt is tolerating exercise well.       Exercise Goals and Review: Exercise Goals    Exercise Goals    Row Name 03/11/18 1006   Increase Physical Activity  Yes   Intervention  Provide advice, education, support and counseling about physical activity/exercise needs.;Develop an individualized exercise  prescription for aerobic and resistive training based on initial evaluation findings, risk stratification, comorbidities and participant's personal goals.   Expected Outcomes  Short Term: Attend rehab on a regular basis to increase amount of physical activity.   Increase Strength and Stamina  Yes   Intervention  Provide advice, education, support and counseling about physical activity/exercise needs.;Develop an individualized exercise prescription for aerobic and resistive training based on initial evaluation findings, risk stratification, comorbidities and participant's personal goals.   Expected  Outcomes  Short Term: Increase workloads from initial exercise prescription for resistance, speed, and METs.   Able to understand and use rate of perceived exertion (RPE) scale  Yes   Intervention  Provide education and explanation on how to use RPE scale   Expected Outcomes  Short Term: Able to use RPE daily in rehab to express subjective intensity level;Long Term:  Able to use RPE to guide intensity level when exercising independently   Knowledge and understanding of Target Heart Rate Range (THRR)  Yes   Intervention  Provide education and explanation of THRR including how the numbers were predicted and where they are located for reference   Expected Outcomes  Short Term: Able to state/look up THRR;Long Term: Able to use THRR to govern intensity when exercising independently;Short Term: Able to use daily as guideline for intensity in rehab   Able to check pulse independently  Yes   Intervention  Provide education and demonstration on how to check pulse in carotid and radial arteries.;Review the importance of being able to check your own pulse for safety during independent exercise   Expected Outcomes  Short Term: Able to explain why pulse checking is important during independent exercise;Long Term: Able to check pulse independently and accurately   Understanding of Exercise Prescription  Yes   Intervention   Provide education, explanation, and written materials on patient's individual exercise prescription   Expected Outcomes  Short Term: Able to explain program exercise prescription;Long Term: Able to explain home exercise prescription to exercise independently          Exercise Goals Re-Evaluation : Exercise Goals Re-Evaluation    Exercise Goal Re-Evaluation    Row Name 03/31/18 0949 04/16/18 1003   Exercise Goals Review  Increase Physical Activity;Increase Strength and Stamina;Able to understand and use rate of perceived exertion (RPE) scale;Knowledge and understanding of Target Heart Rate Range (THRR);Understanding of Exercise Prescription;Able to check pulse independently  Increase Physical Activity;Increase Strength and Stamina;Able to understand and use rate of perceived exertion (RPE) scale;Knowledge and understanding of Target Heart Rate Range (THRR);Understanding of Exercise Prescription;Able to check pulse independently   Comments  Reviewed HEP with Pt. Pt was responsive and understood goals. Pt is wlaking 7 days per week for 30-45 minutes.   Reviewed METs and goals with Pt. MET level is a 4.6 and continues to increase gradually. Pt is tolerating exercise Rx well and increasing levels as tolerated. Pt is walking 50 minutes per day in addition to the program.    Expected Outcomes  Will continue to monitor and progress Pt as tolerated.   Will continue to monitor and progress Pt as tolerated.           Discharge Exercise Prescription (Final Exercise Prescription Changes): Exercise Prescription Changes - 04/14/18 1000    Response to Exercise          Blood Pressure (Admit)  104/72    Blood Pressure (Exercise)  142/70    Blood Pressure (Exit)  104/62    Heart Rate (Admit)  64 bpm    Heart Rate (Exercise)  112 bpm    Heart Rate (Exit)  74 bpm    Rating of Perceived Exertion (Exercise)  12    Perceived Dyspnea (Exercise)  0    Symptoms  None    Duration  Progress to 30 minutes of   aerobic without signs/symptoms of physical distress    Intensity  THRR unchanged        Progression  Progression  Continue to progress workloads to maintain intensity without signs/symptoms of physical distress.    Average METs  4.6        Resistance Training          Training Prescription  Yes    Weight  5 lbs.     Reps  10-15    Time  10 Minutes        Interval Training          Interval Training  No        Bike          Level  1.2    Minutes  10    METs  3.65        NuStep          Level  5    SPM  85    Minutes  10    METs  6.4        Track          Laps  16    Minutes  10    METs  3.78        Home Exercise Plan          Plans to continue exercise at  Home (comment)    Frequency  Add 4 additional days to program exercise sessions.    Initial Home Exercises Provided  03/31/18           Nutrition:  Target Goals: Understanding of nutrition guidelines, daily intake of sodium <1538m, cholesterol <201m calories 30% from fat and 7% or less from saturated fats, daily to have 5 or more servings of fruits and vegetables.  Biometrics: Pre Biometrics - 03/11/18 1007    Pre Biometrics          Height  _0  (1.676 m)    Weight  87 kg    Waist Circumference  40.5 inches    Hip Circumference  41.5 inches    Waist to Hip Ratio  0.98 %    BMI (Calculated)  30.97    Triceps Skinfold  33 mm    % Body Fat  31.7 %    Grip Strength  33 kg    Flexibility  14 in    Single Leg Stand  1.65 seconds            Nutrition Therapy Plan and Nutrition Goals: Nutrition Therapy & Goals - 03/26/18 1044    Nutrition Therapy          Diet  heart healthy         Personal Nutrition Goals          Nutrition Goal  Pt to identify and limit food sources of saturated fat, trans fat, refined carbohydrates and sodium    Personal Goal #2  Pt to identify food quantities necessary to achieve weight loss of 6-24 lb at graduation from cardiac rehab.     Personal  Goal #3  Pt to meal prep and plan ahead of time/ batch cook        Intervention Plan          Intervention  Prescribe, educate and counsel regarding individualized specific dietary modifications aiming towards targeted core components such as weight, hypertension, lipid management, diabetes, heart failure and other comorbidities.    Expected Outcomes  Short Term Goal: Understand basic principles of dietary content, such as calories, fat, sodium, cholesterol and nutrients.;Long Term Goal: Adherence to prescribed nutrition plan.  Nutrition Assessments: Nutrition Assessments - 03/26/18 1049    MEDFICTS Scores          Pre Score  16           Nutrition Goals Re-Evaluation:   Nutrition Goals Re-Evaluation:   Nutrition Goals Discharge (Final Nutrition Goals Re-Evaluation):   Psychosocial: Target Goals: Acknowledge presence or absence of significant depression and/or stress, maximize coping skills, provide positive support system. Participant is able to verbalize types and ability to use techniques and skills needed for reducing stress and depression.  Initial Review & Psychosocial Screening: Initial Psych Review & Screening - 03/21/18 1611    Initial Review          Current issues with  Current Stress Concerns    Source of Stress Concerns  Chronic Illness        Family Dynamics          Good Support System?  Yes   family       Barriers          Psychosocial barriers to participate in program  There are no identifiable barriers or psychosocial needs.        Screening Interventions          Interventions  Encouraged to exercise           Quality of Life Scores: Quality of Life - 03/11/18 1000    Quality of Life          Select  Quality of Life        Quality of Life Scores          Health/Function Pre  24.11 %    Socioeconomic Pre  22.71 %    Psych/Spiritual Pre  23.57 %    Family Pre  23.5 %    GLOBAL Pre  23.61 %          Scores of 19  and below usually indicate a poorer quality of life in these areas.  A difference of  2-3 points is a clinically meaningful difference.  A difference of 2-3 points in the total score of the Quality of Life Index has been associated with significant improvement in overall quality of life, self-image, physical symptoms, and general health in studies assessing change in quality of life.  PHQ-9: Recent Review Flowsheet Data    Depression screen [facility] 2/9 03/17/2018   Decreased Interest 0   Down, Depressed, Hopeless 0   PHQ - 2 Score 0     Interpretation of Total Score  Total Score Depression Severity:  1-4 = Minimal depression, 5-9 = Mild depression, 10-14 = Moderate depression, 15-19 = Moderately severe depression, 20-27 = Severe depression   Psychosocial Evaluation and Intervention: Psychosocial Evaluation - 04/16/18 1205    Psychosocial Evaluation & Interventions          Comments  pt with current stressors, demonstrates positive outlook with good coping skills. pt enjoys outdoor activities including sail boat on [employer].             Psychosocial Re-Evaluation: Psychosocial Re-Evaluation    Psychosocial Re-Evaluation    [place] Name 04/16/18 1206   Current issues with  None Identified   Comments  no psychosocial needs identified, no interventions necessary    Expected Outcomes  pt will exhibit positive outlook with good coping skills.    Interventions  Encouraged to attend Cardiac Rehabilitation for the exercise   Continue Psychosocial Services   No Follow up required  Psychosocial Discharge (Final Psychosocial Re-Evaluation): Psychosocial Re-Evaluation - 04/16/18 1206    Psychosocial Re-Evaluation          Current issues with  None Identified    Comments  no psychosocial needs identified, no interventions necessary     Expected Outcomes  pt will exhibit positive outlook with good coping skills.     Interventions  Encouraged to attend Cardiac Rehabilitation for the  exercise    Continue Psychosocial Services   No Follow up required           Vocational Rehabilitation: Provide vocational rehab assistance to qualifying candidates.   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 Participant will state understanding/return demonstration of topics presented.  Learning Barriers/Preferences: Learning Barriers/Preferences - 03/11/18 1000    Learning Barriers/Preferences          Learning Barriers  Sight    Learning Preferences  Video;Written Material;Skilled Demonstration           Education Topics: Count Your Pulse:  -Group instruction provided by verbal instruction, demonstration, patient participation and written materials to support subject.  Instructors address importance of being able to find your pulse and how to count your pulse when at home without a heart monitor.  Patients get hands on experience counting their pulse with staff help and individually. Flowsheet Row CARDIAC REHAB PHASE II EXERCISE from 04/16/2018 in [facility]  Date  04/04/18  Instruction Review Code  2- Demonstrated Understanding      Heart Attack, Angina, and Risk Factor Modification:  -Group instruction provided by verbal instruction, video, and written materials to support subject.  Instructors address signs and symptoms of angina and heart attacks.    Also discuss risk factors for heart disease and how to make changes to improve heart health risk factors.   Functional Fitness:  -Group instruction provided by verbal instruction, demonstration, patient participation, and written materials to support subject.  Instructors address safety measures  for doing things around the house.  Discuss how to get up and down off the floor, how to pick things up properly, how to safely get out of a chair without assistance, and balance training. Flowsheet Row CARDIAC REHAB PHASE II EXERCISE from 04/16/2018 in [facility]  Date  03/28/18  Instruction Review Code  2- Demonstrated Understanding      Meditation and Mindfulness:  -Group instruction provided by verbal instruction, patient participation, and written materials to support subject.  Instructor addresses importance of mindfulness and meditation practice to help reduce stress and improve awareness.  Instructor also leads participants through a meditation exercise.  Flowsheet Row CARDIAC REHAB PHASE II EXERCISE from 04/16/2018 in [place]  Date  03/26/18  Educator  [place]  Instruction Review Code  2- Demonstrated Understanding      Stretching for Flexibility and Mobility:  -Group instruction provided by verbal instruction, patient participation, and written materials to support subject.  Instructors lead participants through series of stretches that are designed to increase flexibility thus improving mobility.  These stretches are additional exercise for major muscle groups that are typically performed during regular warm  up and cool down.   Hands Only CPR:  -Group verbal, video, and participation provides a basic overview of AHA guidelines for community CPR. Role-play of emergencies allow participants the opportunity to practice calling for help and chest compression technique with discussion of AED use.   Hypertension: -Group verbal and written instruction that provides a basic overview of hypertension including the most recent diagnostic guidelines, risk factor reduction with self-care instructions and medication management.    Nutrition I class: Heart Healthy Eating:  -Group instruction provided by PowerPoint slides, verbal  discussion, and written materials to support subject matter. The instructor gives an explanation and review of the Therapeutic Lifestyle Changes diet recommendations, which includes a discussion on lipid goals, dietary fat, sodium, fiber, plant stanol/sterol esters, sugar, and the components of a well-balanced, healthy diet.   Nutrition II class: Lifestyle Skills:  -Group instruction provided by PowerPoint slides, verbal discussion, and written materials to support subject matter. The instructor gives an explanation and review of label reading, grocery shopping for heart health, heart healthy recipe modifications, and ways to make healthier choices when eating out.   Diabetes Question & Answer:  -Group instruction provided by PowerPoint slides, verbal discussion, and written materials to support subject matter. The instructor gives an explanation and review of diabetes co-morbidities, pre- and post-prandial blood glucose goals, pre-exercise blood glucose goals, signs, symptoms, and treatment of hypoglycemia and hyperglycemia, and foot care basics.   Diabetes Blitz:  -Group instruction provided by PowerPoint slides, verbal discussion, and written materials to support subject matter. The instructor gives an explanation and review of the physiology behind type 1 and type 2 diabetes, diabetes medications and rational behind using different medications, pre- and post-prandial blood glucose recommendations and Hemoglobin A1c goals, diabetes diet, and exercise including blood glucose guidelines for exercising safely.    Portion Distortion:  -Group instruction provided by PowerPoint slides, verbal discussion, written materials, and food models to support subject matter. The instructor gives an explanation of serving size versus portion size, changes in portions sizes over the last 20 years, and what consists of a serving from each food group.   Stress Management:  -Group instruction provided by verbal  instruction, video, and written materials to support subject matter.  Instructors review role of stress in heart disease and how to cope with stress positively.   Flowsheet Row CARDIAC REHAB PHASE II EXERCISE from 04/16/2018 in [place]  Date  04/11/18  Instruction Review Code  2- Demonstrated Understanding      Exercising on Your Own:  -Group instruction provided by verbal instruction, power point, and written materials to support subject.  Instructors discuss benefits of exercise, components of exercise, frequency and intensity of exercise, and end points for exercise.  Also discuss use of nitroglycerin and activating EMS.  Review options of places to exercise outside of rehab.  Review guidelines for sex with heart disease.   Cardiac Drugs I:  -Group instruction provided by verbal instruction and written materials to support subject.  Instructor reviews cardiac drug classes: antiplatelets, anticoagulants, beta blockers, and statins.  Instructor discusses reasons, side effects, and lifestyle considerations for each drug class. Flowsheet Row CARDIAC REHAB PHASE II EXERCISE from 04/16/2018 in [place]  Date  03/19/18  Instruction Review Code  2- Demonstrated Understanding      Cardiac Drugs II:  -Group instruction provided by verbal instruction and written materials to support subject.  Instructor reviews cardiac drug classes: angiotensin converting enzyme  inhibitors (ACE-I), angiotensin II receptor blockers (ARBs), nitrates, and calcium channel blockers.  Instructor discusses reasons, side effects, and lifestyle considerations for each drug class.   Anatomy and Physiology of the Circulatory System:  Group verbal and written instruction and models provide basic cardiac anatomy and physiology, with the coronary electrical and arterial systems. Review of: AMI, Angina, Valve disease, Heart Failure, Peripheral Artery Disease, Cardiac  Arrhythmia, Pacemakers, and the ICD. Flowsheet Row CARDIAC REHAB PHASE II EXERCISE from 04/16/2018 in [place]  Date  04/16/18  Educator  RN  Instruction Review Code  2- Demonstrated Understanding      Other Education:  -Group or individual verbal, written, or video instructions that support the educational goals of the cardiac rehab program.   Holiday Eating Survival Tips:  -Group instruction provided by PowerPoint slides, verbal discussion, and written materials to support subject matter. The instructor gives patients tips, tricks, and techniques to help them not only survive but enjoy the holidays despite the onslaught of food that accompanies the holidays.   Knowledge Questionnaire Score: Knowledge Questionnaire Score - 03/11/18 0955    Knowledge Questionnaire Score          Pre Score  21/24           Core Components/Risk Factors/Patient Goals at Admission: Personal Goals and Risk Factors at Admission - 03/11/18 1001    Core Components/Risk Factors/Patient Goals on Admission           Weight Management  Yes    Intervention  Weight Management: Develop a combined nutrition and exercise program designed to reach desired caloric intake, while maintaining appropriate intake of nutrient and fiber, sodium and fats, and appropriate energy expenditure required for the weight goal.;Weight Management: Provide education and appropriate resources to help participant work on and attain dietary goals.;Weight Management/Obesity: Establish reasonable short term and long term weight goals.    Admit Weight  191 lb 12.8 oz (87 kg)    Expected Outcomes  Short Term: Continue to assess and modify interventions until short term weight is achieved;Long Term: Adherence to nutrition and physical activity/exercise program aimed toward attainment of established weight goal;Weight Maintenance: Understanding of the daily nutrition guidelines, which includes 25-35% calories from  fat, 7% or less cal from saturated fats, less than 249m cholesterol, less than 1.5gm of sodium, & 5 or more servings of fruits and vegetables daily;Understanding recommendations for meals to include 15-35% energy as protein, 25-35% energy from fat, 35-60% energy from carbohydrates, less than 2054mof dietary cholesterol, 20-35 gm of total fiber daily;Understanding of distribution of calorie intake throughout the day with the consumption of 4-5 meals/snacks;Weight Loss: Understanding of general recommendations for a balanced deficit meal plan, which promotes 1-2 lb weight loss per week and includes a negative energy balance of [phone] kcal/d    Hypertension  Yes    Intervention  Provide education on lifestyle modifcations including regular physical activity/exercise, weight management, moderate sodium restriction and increased consumption of fresh fruit, vegetables, and low fat dairy, alcohol moderation, and smoking cessation.;Monitor prescription use compliance.    Expected Outcomes  Short Term: Continued assessment and intervention until BP is < 140/9075mG in hypertensive participants. < 130/31m8m in hypertensive participants with diabetes, heart failure or chronic kidney disease.;Long Term: Maintenance of blood pressure at goal levels.    Lipids  Yes    Intervention  Provide education and support for participant on nutrition & aerobic/resistive exercise along with prescribed medications to achieve LDL <70mg57mL >  50m.    Expected Outcomes  Short Term: Participant states understanding of desired cholesterol values and is compliant with medications prescribed. Participant is following exercise prescription and nutrition guidelines.;Long Term: Cholesterol controlled with medications as prescribed, with individualized exercise RX and with personalized nutrition plan. Value goals: LDL < 723m HDL > 40 mg.    Stress  Yes    Intervention  Offer individual and/or small group education and counseling on  adjustment to heart disease, stress management and health-related lifestyle change. Teach and support self-help strategies.;Refer participants experiencing significant psychosocial distress to appropriate mental health specialists for further evaluation and treatment. When possible, include family members and significant others in education/counseling sessions.    Expected Outcomes  Short Term: Participant demonstrates changes in health-related behavior, relaxation and other stress management skills, ability to obtain effective social support, and compliance with psychotropic medications if prescribed.;Long Term: Emotional wellbeing is indicated by absence of clinically significant psychosocial distress or social isolation.           Core Components/Risk Factors/Patient Goals Review:  Goals and Risk Factor Review    Core Components/Risk Factors/Patient Goals Review    Row Name 03/17/18 0939 04/16/18 1206   Personal Goals Review  Weight Management/Obesity;Hypertension;Lipids;Stress  Weight Management/Obesity;Hypertension;Lipids;Stress   Review  pt with multiple CAD RF demonstrates eagerness to participate in CR program.  pt personal goals are to improve heart health and lose weight.   pt with multiple CAD RF demonstrates eagerness to participate in CR program.  pt personal goals are to improve heart health and lose weight. pt reports increased energy with daily activities. pt is currently walking 3 miles for HEP on off days from CR.    Expected Outcomes  pt will participate in CR exercise, nutrition and lifestyle modification opportunities to decrease overall RF.   pt will participate in CR exercise, nutrition and lifestyle modification opportunities to decrease overall RF.           Core Components/Risk Factors/Patient Goals at Discharge (Final Review):  Goals and Risk Factor Review - 04/16/18 1206    Core Components/Risk Factors/Patient Goals Review          Personal Goals Review  Weight  Management/Obesity;Hypertension;Lipids;Stress    Review  pt with multiple CAD RF demonstrates eagerness to participate in CR program.  pt personal goals are to improve heart health and lose weight. pt reports increased energy with daily activities. pt is currently walking 3 miles for HEP on off days from CR.     Expected Outcomes  pt will participate in CR exercise, nutrition and lifestyle modification opportunities to decrease overall RF.            ITP Comments: ITP Comments    Row Name 03/11/18 [id number]/06/20 [id number]/08/20 [id number] 04/16/18 1205   ITP Comments  Dr. TrFransico HimMedical Director   pt started group exercise session today.  pt tolerated light activity without difficulty.  pt oriented to exercise equipment and safety routine. understanding verbalized.   30 day ITP review.   30 day ITP review. pt with good attendance and participation.  pt demonstrates eagerness to participate in CR program.       Comments:

## 2018-04-18 ENCOUNTER — Encounter (HOSPITAL_COMMUNITY)
Admission: RE | Admit: 2018-04-18 | Discharge: 2018-04-18 | Disposition: A | Discharge status: Home / Self Care | Payer: Managed Care, Other (non HMO) | Source: Ambulatory Visit | Attending: Cardiology | Admitting: Cardiology

## 2018-04-18 ENCOUNTER — Ambulatory Visit (HOSPITAL_COMMUNITY): Payer: Self-pay

## 2018-04-18 DIAGNOSIS — Z951 Presence of aortocoronary bypass graft: Secondary | ICD-10-CM

## 2018-04-18 DIAGNOSIS — I214 Non-ST elevation (NSTEMI) myocardial infarction: Secondary | ICD-10-CM | POA: Diagnosis not present

## 2018-04-21 ENCOUNTER — Ambulatory Visit (HOSPITAL_COMMUNITY): Payer: Self-pay

## 2018-04-21 ENCOUNTER — Encounter (HOSPITAL_COMMUNITY)
Admission: RE | Admit: 2018-04-21 | Discharge: 2018-04-21 | Disposition: A | Discharge status: Home / Self Care | Payer: Managed Care, Other (non HMO) | Source: Ambulatory Visit | Attending: Cardiology | Admitting: Cardiology

## 2018-04-21 DIAGNOSIS — I214 Non-ST elevation (NSTEMI) myocardial infarction: Secondary | ICD-10-CM

## 2018-04-21 DIAGNOSIS — Z951 Presence of aortocoronary bypass graft: Secondary | ICD-10-CM

## 2018-04-23 ENCOUNTER — Ambulatory Visit (HOSPITAL_COMMUNITY): Payer: Self-pay

## 2018-04-23 ENCOUNTER — Encounter (HOSPITAL_COMMUNITY)
Admission: RE | Admit: 2018-04-23 | Discharge: 2018-04-23 | Disposition: A | Discharge status: Home / Self Care | Payer: Managed Care, Other (non HMO) | Source: Ambulatory Visit | Attending: Cardiology | Admitting: Cardiology

## 2018-04-23 DIAGNOSIS — I214 Non-ST elevation (NSTEMI) myocardial infarction: Secondary | ICD-10-CM | POA: Diagnosis not present

## 2018-04-23 DIAGNOSIS — Z951 Presence of aortocoronary bypass graft: Secondary | ICD-10-CM

## 2018-04-25 ENCOUNTER — Encounter (HOSPITAL_COMMUNITY)
Admission: RE | Admit: 2018-04-25 | Discharge: 2018-04-25 | Disposition: A | Discharge status: Home / Self Care | Payer: Managed Care, Other (non HMO) | Source: Ambulatory Visit | Attending: Cardiology | Admitting: Cardiology

## 2018-04-25 ENCOUNTER — Ambulatory Visit (HOSPITAL_COMMUNITY): Payer: Self-pay

## 2018-04-25 DIAGNOSIS — Z951 Presence of aortocoronary bypass graft: Secondary | ICD-10-CM

## 2018-04-25 DIAGNOSIS — I214 Non-ST elevation (NSTEMI) myocardial infarction: Secondary | ICD-10-CM

## 2018-04-28 ENCOUNTER — Ambulatory Visit (HOSPITAL_COMMUNITY): Payer: Self-pay

## 2018-04-28 ENCOUNTER — Encounter (HOSPITAL_COMMUNITY)
Admission: RE | Admit: 2018-04-28 | Discharge: 2018-04-28 | Disposition: A | Discharge status: Home / Self Care | Payer: Managed Care, Other (non HMO) | Source: Ambulatory Visit | Attending: Cardiology | Admitting: Cardiology

## 2018-04-28 ENCOUNTER — Telehealth: Payer: Self-pay | Admitting: Cardiology

## 2018-04-28 DIAGNOSIS — Z951 Presence of aortocoronary bypass graft: Secondary | ICD-10-CM

## 2018-04-28 DIAGNOSIS — I214 Non-ST elevation (NSTEMI) myocardial infarction: Secondary | ICD-10-CM | POA: Diagnosis not present

## 2018-04-28 MED ORDER — METOPROLOL TARTRATE 50 MG PO TABS: 25.0000 mg | ORAL_TABLET | Freq: Two times a day (BID) | ORAL | 3 refills | Status: DC

## 2018-04-28 NOTE — Telephone Encounter (Signed)
Rx(s) sent to pharmacy electronically.  

## 2018-04-28 NOTE — Telephone Encounter (Signed)
° ° ° °  Patient is requesting additional refills   1. Which medications need to be refilled? (please list name of each medication and dose if known) metoprolol tartrate (LOPRESSOR) 50 MG tablet  2. Which pharmacy/location (including street and city if local pharmacy) is medication to be sent to? [facility]  3. Do they need a 30 day or 90 day supply? [place]

## 2018-04-30 ENCOUNTER — Ambulatory Visit (HOSPITAL_COMMUNITY): Payer: Self-pay

## 2018-04-30 ENCOUNTER — Encounter (HOSPITAL_COMMUNITY)
Admission: RE | Admit: 2018-04-30 | Discharge: 2018-04-30 | Disposition: A | Discharge status: Home / Self Care | Payer: Managed Care, Other (non HMO) | Source: Ambulatory Visit | Attending: Cardiology | Admitting: Cardiology

## 2018-04-30 DIAGNOSIS — I214 Non-ST elevation (NSTEMI) myocardial infarction: Secondary | ICD-10-CM

## 2018-04-30 DIAGNOSIS — Z951 Presence of aortocoronary bypass graft: Secondary | ICD-10-CM

## 2018-05-02 ENCOUNTER — Encounter (HOSPITAL_COMMUNITY)
Admission: RE | Admit: 2018-05-02 | Discharge: 2018-05-02 | Disposition: A | Discharge status: Home / Self Care | Payer: Managed Care, Other (non HMO) | Source: Ambulatory Visit | Attending: Cardiology | Admitting: Cardiology

## 2018-05-02 ENCOUNTER — Ambulatory Visit (HOSPITAL_COMMUNITY): Payer: Self-pay

## 2018-05-02 DIAGNOSIS — Z951 Presence of aortocoronary bypass graft: Secondary | ICD-10-CM

## 2018-05-02 DIAGNOSIS — I214 Non-ST elevation (NSTEMI) myocardial infarction: Secondary | ICD-10-CM

## 2018-05-05 ENCOUNTER — Encounter (HOSPITAL_COMMUNITY)
Admission: RE | Admit: 2018-05-05 | Discharge: 2018-05-05 | Disposition: A | Discharge status: Home / Self Care | Payer: Managed Care, Other (non HMO) | Source: Ambulatory Visit | Attending: Cardiology | Admitting: Cardiology

## 2018-05-05 ENCOUNTER — Ambulatory Visit (HOSPITAL_COMMUNITY): Payer: Self-pay

## 2018-05-05 DIAGNOSIS — I214 Non-ST elevation (NSTEMI) myocardial infarction: Secondary | ICD-10-CM

## 2018-05-05 DIAGNOSIS — Z951 Presence of aortocoronary bypass graft: Secondary | ICD-10-CM

## 2018-05-07 ENCOUNTER — Encounter (HOSPITAL_COMMUNITY)
Admission: RE | Admit: 2018-05-07 | Discharge: 2018-05-07 | Disposition: A | Discharge status: Home / Self Care | Payer: Managed Care, Other (non HMO) | Source: Ambulatory Visit | Attending: Cardiology | Admitting: Cardiology

## 2018-05-07 ENCOUNTER — Ambulatory Visit (HOSPITAL_COMMUNITY): Payer: Self-pay

## 2018-05-07 DIAGNOSIS — I214 Non-ST elevation (NSTEMI) myocardial infarction: Secondary | ICD-10-CM

## 2018-05-07 DIAGNOSIS — Z951 Presence of aortocoronary bypass graft: Secondary | ICD-10-CM

## 2018-05-09 ENCOUNTER — Encounter (HOSPITAL_COMMUNITY)
Admission: RE | Admit: 2018-05-09 | Discharge: 2018-05-09 | Disposition: A | Discharge status: Home / Self Care | Payer: Managed Care, Other (non HMO) | Source: Ambulatory Visit | Attending: Cardiology | Admitting: Cardiology

## 2018-05-09 ENCOUNTER — Ambulatory Visit (HOSPITAL_COMMUNITY): Payer: Self-pay

## 2018-05-09 DIAGNOSIS — Z951 Presence of aortocoronary bypass graft: Secondary | ICD-10-CM

## 2018-05-09 DIAGNOSIS — I214 Non-ST elevation (NSTEMI) myocardial infarction: Secondary | ICD-10-CM | POA: Diagnosis not present

## 2018-05-12 ENCOUNTER — Encounter (HOSPITAL_COMMUNITY)
Admission: RE | Admit: 2018-05-12 | Discharge: 2018-05-12 | Disposition: A | Discharge status: Home / Self Care | Payer: Managed Care, Other (non HMO) | Source: Ambulatory Visit | Attending: Cardiology | Admitting: Cardiology

## 2018-05-12 ENCOUNTER — Ambulatory Visit (HOSPITAL_COMMUNITY): Payer: Self-pay

## 2018-05-12 DIAGNOSIS — I214 Non-ST elevation (NSTEMI) myocardial infarction: Secondary | ICD-10-CM | POA: Diagnosis not present

## 2018-05-12 DIAGNOSIS — Z951 Presence of aortocoronary bypass graft: Secondary | ICD-10-CM | POA: Diagnosis present

## 2018-05-14 ENCOUNTER — Ambulatory Visit (HOSPITAL_COMMUNITY): Payer: Self-pay

## 2018-05-14 ENCOUNTER — Encounter (HOSPITAL_COMMUNITY)
Admission: RE | Admit: 2018-05-14 | Discharge: 2018-05-14 | Disposition: A | Discharge status: Home / Self Care | Payer: Managed Care, Other (non HMO) | Source: Ambulatory Visit | Attending: Cardiology | Admitting: Cardiology

## 2018-05-14 DIAGNOSIS — I214 Non-ST elevation (NSTEMI) myocardial infarction: Secondary | ICD-10-CM

## 2018-05-14 DIAGNOSIS — Z951 Presence of aortocoronary bypass graft: Secondary | ICD-10-CM

## 2018-05-14 NOTE — Progress Notes (Signed)
Cardiac Individual Treatment Plan  Patient Details  Name: Paul Duncan MRN: [id number] Date of Birth: 11-Jul-1975 Referring Provider:   Flowsheet Row CARDIAC REHAB PHASE II ORIENTATION from 03/11/2018 in [place]  Referring Provider  Dr. Martinique      Initial Encounter Date:  Flowsheet Row CARDIAC REHAB PHASE II ORIENTATION from 03/11/2018 in [place]  Date  03/11/18      Visit Diagnosis: 12/23/2017 S/P CABG (coronary artery bypass graft)  12/19/2017 NSTEMI (non-ST elevated myocardial infarction) ([place])  Patient's Home Medications on Admission:  Current Outpatient Medications:  .  aspirin EC 325 MG EC tablet, Take 1 tablet (325 mg total) by mouth daily., Disp: 30 tablet, Rfl: 0 .  atorvastatin (LIPITOR) 80 MG tablet, Take 1 tablet (80 mg total) by mouth daily at 6 PM., Disp: 30 tablet, Rfl: 1 .  metoprolol tartrate (LOPRESSOR) 50 MG tablet, Take 0.5 tablets (25 mg total) by mouth 2 (two) times daily., Disp: 90 tablet, Rfl: 3  Past Medical History: Past Medical History:  Diagnosis Date  . Brain tumor ([place])   . Hypertension   . Seizures ([place])     Tobacco Use: Social History   Tobacco Use  Smoking Status Never Smoker  Smokeless Tobacco Never Used    Labs: Recent Review Flowsheet Data    Labs for ITP Cardiac and Pulmonary Rehab Latest Ref Rng & Units 12/23/2017 12/23/2017 12/23/2017 12/24/2017 03/10/2018   Cholestrol 100 - 199 mg/dL - - - - 120   LDLCALC 0 - 99 mg/dL - - - - 65   HDL >39 mg/dL - - - - 31(L)   Trlycerides 0 - 149 mg/dL - - - - 120   Hemoglobin A1c 4.8 - 5.6 % - - - - -   PHART 7.350 - 7.450 7.359 7.326(L) - - -   PCO2ART 32.0 - 48.0 mmHg 37.1 39.9 - - -   HCO3 20.0 - 28.0 mmol/L 20.9 20.8 - - -   TCO2 22 - 32 mmol/L '22 22 22 22 ' -   ACIDBASEDEF 0.0 - 2.0 mmol/L 4.0(H) 5.0(H) - - -   O2SAT % 94.0 89.0 - 68.1 -      Capillary Blood Glucose: Lab Results  Component Value Date    GLUCAP 89 12/28/2017   GLUCAP 86 12/28/2017   GLUCAP 91 12/27/2017   GLUCAP 80 12/27/2017   GLUCAP 76 12/27/2017     Exercise Target Goals: Exercise Program Goal: Individual exercise prescription set using results from initial 6 min walk test and THRR while considering  patient's activity barriers and safety.   Exercise Prescription Goal: Initial exercise prescription builds to 30-45 minutes a day of aerobic activity, 2-3 days per week.  Home exercise guidelines will be given to patient during program as part of exercise prescription that the participant will acknowledge.  Activity Barriers & Risk Stratification: Activity Barriers & Cardiac Risk Stratification - 03/11/18 1006    Activity Barriers & Cardiac Risk Stratification          Activity Barriers  None    Cardiac Risk Stratification  High           6 Minute Walk: 6 Minute Walk    6 Minute Walk    Row Name 03/11/18 1003   Phase  Initial   Distance  1573 feet   Walk Time  6 minutes   # of Rest Breaks  0   MPH  2.9   METS  4.43   RPE  12   VO2 Peak  15.51   Symptoms  No   Resting HR  88 bpm   Resting BP  128/80   Resting Oxygen Saturation   100 %   Exercise Oxygen Saturation  during 6 min walk  98 %   Max Ex. HR  84 bpm   Max Ex. BP  132/78   2 Minute Post BP  118/68          Oxygen Initial Assessment:   Oxygen Re-Evaluation:   Oxygen Discharge (Final Oxygen Re-Evaluation):   Initial Exercise Prescription: Initial Exercise Prescription - 03/11/18 1000    Date of Initial Exercise RX and Referring Provider          Date  03/11/18    Referring Provider  Dr. Martinique    Expected Discharge Date  06/20/18        Bike          Level  1.2    Minutes  10    METs  3.66        NuStep          Level  4    SPM  85    Minutes  10    METs  4.4        Track          Laps  10    Minutes  10    METs  2.76        Prescription Details          Frequency (times per week)  3    Duration   Progress to 30 minutes of continuous aerobic without signs/symptoms of physical distress        Intensity          THRR 40-80% of Max Heartrate  70-141    Ratings of Perceived Exertion  11-13        Progression          Progression  Continue to progress workloads to maintain intensity without signs/symptoms of physical distress.        Resistance Training          Training Prescription  Yes    Weight  4 lbs.    Reps  10-15           Perform Capillary Blood Glucose checks as needed.  Exercise Prescription Changes: Exercise Prescription Changes    Response to Exercise    Row Name 03/17/18 0900 03/31/18 0900 04/14/18 1000 05/06/18 1300   Blood Pressure (Admit)  118/70  no documentation  104/72  114/70   Blood Pressure (Exercise)  128/80  no documentation  142/70  122/82   Blood Pressure (Exit)  102/60  no documentation  104/62  104/68   Heart Rate (Admit)  66 bpm  no documentation  64 bpm  69 bpm   Heart Rate (Exercise)  97 bpm  no documentation  112 bpm  120 bpm   Heart Rate (Exit)  61 bpm  no documentation  74 bpm  79 bpm   Rating of Perceived Exertion (Exercise)  12  no documentation  12  11   Perceived Dyspnea (Exercise)  0  no documentation  0  0   Symptoms  None  no documentation  None  None   Comments  Pt oriented to exercise equipment  no documentation  no documentation  no documentation   Duration  Progress to 30 minutes of  aerobic without signs/symptoms  of physical distress  no documentation  Progress to 30 minutes of  aerobic without signs/symptoms of physical distress  Progress to 30 minutes of  aerobic without signs/symptoms of physical distress   Intensity  THRR unchanged  no documentation  THRR unchanged  THRR unchanged       Progression    Row Name 03/17/18 0900 03/31/18 0900 04/14/18 1000 05/06/18 1300   Progression  Continue to progress workloads to maintain intensity without signs/symptoms of physical distress.  no documentation  Continue to progress  workloads to maintain intensity without signs/symptoms of physical distress.  Continue to progress workloads to maintain intensity without signs/symptoms of physical distress.   Average METs  3.48  no documentation  4.6  4.5       Resistance Training    Row Name 03/17/18 0900 03/31/18 0900 04/14/18 1000 05/06/18 1300   Training Prescription  Yes  no documentation  Yes  Yes   Weight  4lbs  no documentation  5 lbs.   5 lbs.    Reps  10-15  no documentation  10-15  10-15   Time  10 Minutes  no documentation  10 Minutes  10 Minutes       Interval Training    Row Name 03/17/18 0900 03/31/18 0900 04/14/18 1000 05/06/18 1300   Interval Training  No  no documentation  No  No       Bike    Row Name 03/17/18 0900 03/31/18 0900 04/14/18 1000 05/06/18 1300   Level  1.2  no documentation  1.2  1.2   Minutes  10  no documentation  10  10   METs  3.6  no documentation  3.65  3.63       NuStep    Row Name 03/17/18 0900 03/31/18 0900 04/14/18 1000 05/06/18 1300   Level  4  no documentation  5  7   SPM  85  no documentation  85  85   Minutes  10  no documentation  10  10   METs  3.4  no documentation  6.4  6       Track    Row Name 03/17/18 0900 03/31/18 0900 04/14/18 1000 05/06/18 1300   Laps  14  no documentation  16  16   Minutes  10  no documentation  10  10   METs  3.45  no documentation  3.78  3.76       Home Exercise Plan    [place] Name 03/17/18 0900 03/31/18 0900 04/14/18 1000 05/06/18 1300   Plans to continue exercise at  no documentation  Home (comment)  Home (comment)  Home (comment)   Frequency  no documentation  Add 4 additional days to program exercise sessions.  Add 4 additional days to program exercise sessions.  Add 4 additional days to program exercise sessions.   Initial Home Exercises Provided  no documentation  03/31/18  03/31/18  03/31/18          Exercise Comments: Exercise Comments    Row Name 03/17/18 5686 03/31/18 0951 04/16/18 1006 05/13/18 1422   Exercise  Comments  Pt's first day of exercise. Pt oriented to exercise equipment. Pt responded well to workloads. Will continue to monitor and progress pt.  Reviewed HEP with Pt. Pt was responsive and understands goals.   Reviewed METs and goals with Pt. Pt is tolerating exercise well.   Reviewed METs and goals with Pt. Pt is tolerating exercise well.  Exercise Goals and Review: Exercise Goals    Exercise Goals    Row Name 03/11/18 1006   Increase Physical Activity  Yes   Intervention  Provide advice, education, support and counseling about physical activity/exercise needs.;Develop an individualized exercise prescription for aerobic and resistive training based on initial evaluation findings, risk stratification, comorbidities and participant's personal goals.   Expected Outcomes  Short Term: Attend rehab on a regular basis to increase amount of physical activity.   Increase Strength and Stamina  Yes   Intervention  Provide advice, education, support and counseling about physical activity/exercise needs.;Develop an individualized exercise prescription for aerobic and resistive training based on initial evaluation findings, risk stratification, comorbidities and participant's personal goals.   Expected Outcomes  Short Term: Increase workloads from initial exercise prescription for resistance, speed, and METs.   Able to understand and use rate of perceived exertion (RPE) scale  Yes   Intervention  Provide education and explanation on how to use RPE scale   Expected Outcomes  Short Term: Able to use RPE daily in rehab to express subjective intensity level;Long Term:  Able to use RPE to guide intensity level when exercising independently   Knowledge and understanding of Target Heart Rate Range (THRR)  Yes   Intervention  Provide education and explanation of THRR including how the numbers were predicted and where they are located for reference   Expected Outcomes  Short Term: Able to state/look up  THRR;Long Term: Able to use THRR to govern intensity when exercising independently;Short Term: Able to use daily as guideline for intensity in rehab   Able to check pulse independently  Yes   Intervention  Provide education and demonstration on how to check pulse in carotid and radial arteries.;Review the importance of being able to check your own pulse for safety during independent exercise   Expected Outcomes  Short Term: Able to explain why pulse checking is important during independent exercise;Long Term: Able to check pulse independently and accurately   Understanding of Exercise Prescription  Yes   Intervention  Provide education, explanation, and written materials on patient's individual exercise prescription   Expected Outcomes  Short Term: Able to explain program exercise prescription;Long Term: Able to explain home exercise prescription to exercise independently          Exercise Goals Re-Evaluation : Exercise Goals Re-Evaluation    Exercise Goal Re-Evaluation    Row Name 03/31/18 0949 04/16/18 1003 05/13/18 1422   Exercise Goals Review  Increase Physical Activity;Increase Strength and Stamina;Able to understand and use rate of perceived exertion (RPE) scale;Knowledge and understanding of Target Heart Rate Range (THRR);Understanding of Exercise Prescription;Able to check pulse independently  Increase Physical Activity;Increase Strength and Stamina;Able to understand and use rate of perceived exertion (RPE) scale;Knowledge and understanding of Target Heart Rate Range (THRR);Understanding of Exercise Prescription;Able to check pulse independently  Increase Physical Activity;Increase Strength and Stamina;Able to understand and use rate of perceived exertion (RPE) scale;Knowledge and understanding of Target Heart Rate Range (THRR);Understanding of Exercise Prescription;Able to check pulse independently   Comments  Reviewed HEP with Pt. Pt was responsive and understood goals. Pt is wlaking 7  days per week for 30-45 minutes.   Reviewed METs and goals with Pt. MET level is a 4.6 and continues to increase gradually. Pt is tolerating exercise Rx well and increasing levels as tolerated. Pt is walking 50 minutes per day in addition to the program.   Reviewed METs and goals with Pt. MET level is a 4.6 and  continues to increase gradually. Pt is tolerating exercise Rx well and increasing levels as tolerated. Pt is walking 50 minutes per day in addition to the program.    Expected Outcomes  Will continue to monitor and progress Pt as tolerated.   Will continue to monitor and progress Pt as tolerated.   Will continue to monitor and progress Pt as tolerated.           Discharge Exercise Prescription (Final Exercise Prescription Changes): Exercise Prescription Changes - 05/06/18 1300    Response to Exercise          Blood Pressure (Admit)  114/70    Blood Pressure (Exercise)  122/82    Blood Pressure (Exit)  104/68    Heart Rate (Admit)  69 bpm    Heart Rate (Exercise)  120 bpm    Heart Rate (Exit)  79 bpm    Rating of Perceived Exertion (Exercise)  11    Perceived Dyspnea (Exercise)  0    Symptoms  None    Duration  Progress to 30 minutes of  aerobic without signs/symptoms of physical distress    Intensity  THRR unchanged        Progression          Progression  Continue to progress workloads to maintain intensity without signs/symptoms of physical distress.    Average METs  4.5        Resistance Training          Training Prescription  Yes    Weight  5 lbs.     Reps  10-15    Time  10 Minutes        Interval Training          Interval Training  No        Bike          Level  1.2    Minutes  10    METs  3.63        NuStep          Level  7    SPM  85    Minutes  10    METs  6        Track          Laps  16    Minutes  10    METs  3.76        Home Exercise Plan          Plans to continue exercise at  Home (comment)    Frequency  Add 4 additional  days to program exercise sessions.    Initial Home Exercises Provided  03/31/18           Nutrition:  Target Goals: Understanding of nutrition guidelines, daily intake of sodium <1532m, cholesterol <2048m calories 30% from fat and 7% or less from saturated fats, daily to have 5 or more servings of fruits and vegetables.  Biometrics: Pre Biometrics - 03/11/18 1007    Pre Biometrics          Height  '5\' 6"'  (1.676 m)    Weight  87 kg    Waist Circumference  40.5 inches    Hip Circumference  41.5 inches    Waist to Hip Ratio  0.98 %    BMI (Calculated)  30.97    Triceps Skinfold  33 mm    % Body Fat  31.7 %    Grip Strength  33 kg    Flexibility  14 in  Single Leg Stand  1.65 seconds            Nutrition Therapy Plan and Nutrition Goals: Nutrition Therapy & Goals - 03/26/18 1044    Nutrition Therapy          Diet  heart healthy         Personal Nutrition Goals          Nutrition Goal  Pt to identify and limit food sources of saturated fat, trans fat, refined carbohydrates and sodium    Personal Goal #2  Pt to identify food quantities necessary to achieve weight loss of 6-24 lb at graduation from cardiac rehab.     Personal Goal #3  Pt to meal prep and plan ahead of time/ batch cook        Intervention Plan          Intervention  Prescribe, educate and counsel regarding individualized specific dietary modifications aiming towards targeted core components such as weight, hypertension, lipid management, diabetes, heart failure and other comorbidities.    Expected Outcomes  Short Term Goal: Understand basic principles of dietary content, such as calories, fat, sodium, cholesterol and nutrients.;Long Term Goal: Adherence to prescribed nutrition plan.           Nutrition Assessments: Nutrition Assessments - 03/26/18 1049    MEDFICTS Scores          Pre Score  16           Nutrition Goals Re-Evaluation:   Nutrition Goals Re-Evaluation:   Nutrition Goals  Discharge (Final Nutrition Goals Re-Evaluation):   Psychosocial: Target Goals: Acknowledge presence or absence of significant depression and/or stress, maximize coping skills, provide positive support system. Participant is able to verbalize types and ability to use techniques and skills needed for reducing stress and depression.  Initial Review & Psychosocial Screening: Initial Psych Review & Screening - 03/21/18 1611    Initial Review          Current issues with  Current Stress Concerns    Source of Stress Concerns  Chronic Illness        Family Dynamics          Good Support System?  Yes   family       Barriers          Psychosocial barriers to participate in program  There are no identifiable barriers or psychosocial needs.        Screening Interventions          Interventions  Encouraged to exercise           Quality of Life Scores: Quality of Life - 03/11/18 1000    Quality of Life          Select  Quality of Life        Quality of Life Scores          Health/Function Pre  24.11 %    Socioeconomic Pre  22.71 %    Psych/Spiritual Pre  23.57 %    Family Pre  23.5 %    GLOBAL Pre  23.61 %          Scores of 19 and below usually indicate a poorer quality of life in these areas.  A difference of  2-3 points is a clinically meaningful difference.  A difference of 2-3 points in the total score of the Quality of Life Index has been associated with significant improvement in overall quality of life, self-image, physical symptoms, and general  health in studies assessing change in quality of life.  PHQ-9: Recent Review Flowsheet Data    Depression screen [facility] 2/9 03/17/2018   Decreased Interest 0   Down, Depressed, Hopeless 0   PHQ - 2 Score 0     Interpretation of Total Score  Total Score Depression Severity:  1-4 = Minimal depression, 5-9 = Mild depression, 10-14 = Moderate depression, 15-19 = Moderately severe depression, 20-27 = Severe depression    Psychosocial Evaluation and Intervention: Psychosocial Evaluation - 04/16/18 1205    Psychosocial Evaluation & Interventions          Comments  pt with current stressors, demonstrates positive outlook with good coping skills. pt enjoys outdoor activities including sail boat on [employer].             Psychosocial Re-Evaluation: Psychosocial Re-Evaluation    Psychosocial Re-Evaluation    [place] Name 04/16/18 1206 05/13/18 1338   Current issues with  None Identified  None Identified   Comments  no psychosocial needs identified, no interventions necessary   no psychosocial needs identified, no interventions necessary    Expected Outcomes  pt will exhibit positive outlook with good coping skills.   pt will exhibit positive outlook with good coping skills.    Interventions  Encouraged to attend Cardiac Rehabilitation for the exercise  Encouraged to attend Cardiac Rehabilitation for the exercise   Continue Psychosocial Services   No Follow up required  No Follow up required          Psychosocial Discharge (Final Psychosocial Re-Evaluation): Psychosocial Re-Evaluation - 05/13/18 1338    Psychosocial Re-Evaluation          Current issues with  None Identified    Comments  no psychosocial needs identified, no interventions necessary     Expected Outcomes  pt will exhibit positive outlook with good coping skills.     Interventions  Encouraged to attend Cardiac Rehabilitation for the exercise    Continue Psychosocial Services   No Follow up required           Vocational Rehabilitation: Provide vocational rehab assistance to qualifying candidates.   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 Participant will state understanding/return demonstration of topics presented.  Learning Barriers/Preferences: Learning Barriers/Preferences - 03/11/18 1000    Learning Barriers/Preferences          Learning Barriers  Sight    Learning Preferences  Video;Written Material;Skilled Demonstration           Education Topics: Count Your Pulse:  -Group instruction provided by verbal instruction, demonstration, patient participation and written materials to support subject.  Instructors address importance of being able to find your pulse and how to count your pulse when at home without a heart monitor.  Patients get hands on experience counting their pulse with staff help and individually. Flowsheet Row CARDIAC REHAB PHASE II EXERCISE from 05/07/2018 in [place]  Date  04/04/18  Instruction Review Code  2- Demonstrated Understanding      Heart Attack, Angina, and Risk Factor Modification:  -Group instruction provided by verbal instruction, video, and written materials to support subject.  Instructors address signs and symptoms of angina and heart attacks.    Also discuss risk factors for heart disease and how to make changes to improve heart health risk factors.   Functional Fitness:  -Group instruction provided by verbal instruction, demonstration, patient participation, and written materials to support subject.  Instructors address safety measures for doing things around the house.  Discuss how to get up and down off the floor, how to pick things up properly, how to safely get out of a chair without assistance, and balance training. Flowsheet Row CARDIAC REHAB PHASE II EXERCISE from 05/07/2018 in [place]  Date  03/28/18  Instruction Review Code  2- Demonstrated Understanding      Meditation and Mindfulness:  -Group instruction provided by verbal instruction, patient participation, and written materials to support  subject.  Instructor addresses importance of mindfulness and meditation practice to help reduce stress and improve awareness.  Instructor also leads participants through a meditation exercise.  Flowsheet Row CARDIAC REHAB PHASE II EXERCISE from 05/07/2018 in [place]  Date  03/26/18  Educator  [place]  Instruction Review Code  2- Demonstrated Understanding      Stretching for Flexibility and Mobility:  -Group instruction provided by verbal instruction, patient participation, and written materials to support subject.  Instructors lead participants through series of stretches that are designed to increase flexibility thus improving mobility.  These stretches are additional exercise for major muscle groups that are typically performed during regular warm up and cool down.   Hands Only CPR:  -Group verbal, video, and participation provides a basic overview of AHA guidelines for community CPR. Role-play of emergencies allow participants the opportunity to practice calling for help and chest compression technique with discussion of AED use.   Hypertension: -Group verbal and written instruction that provides a basic overview of hypertension including the most recent diagnostic guidelines, risk factor reduction with self-care instructions and medication management. Flowsheet Row CARDIAC REHAB PHASE II EXERCISE from 05/07/2018 in [place]  Date  04/18/18  Educator  RN  Instruction Review Code  2- Demonstrated Understanding       Nutrition I class: Heart Healthy Eating:  -Group instruction provided by PowerPoint slides, verbal discussion, and written materials to support subject matter. The instructor gives an explanation and review of the Therapeutic Lifestyle Changes diet recommendations, which includes a discussion on lipid goals, dietary fat, sodium, fiber, plant stanol/sterol esters, sugar, and the components of a well-balanced,  healthy diet.   Nutrition II class: Lifestyle Skills:  -Group instruction provided by PowerPoint slides, verbal discussion, and written materials to support subject matter. The instructor gives an explanation and review of label reading, grocery shopping for heart health, heart healthy recipe modifications, and ways to make healthier choices when eating out.   Diabetes Question & Answer:  -Group instruction provided by PowerPoint slides, verbal discussion, and written materials to support subject matter. The instructor gives an explanation and review of diabetes co-morbidities, pre- and post-prandial blood glucose goals, pre-exercise blood glucose goals, signs, symptoms, and treatment of hypoglycemia and hyperglycemia, and foot care basics.   Diabetes Blitz:  -Group instruction provided by PowerPoint slides, verbal discussion, and written materials to support subject matter. The instructor gives an explanation and review of the physiology behind type 1 and type 2 diabetes, diabetes medications and rational behind using different medications, pre- and post-prandial blood glucose recommendations and Hemoglobin A1c goals, diabetes diet, and exercise including  blood glucose guidelines for exercising safely.    Portion Distortion:  -Group instruction provided by PowerPoint slides, verbal discussion, written materials, and food models to support subject matter. The instructor gives an explanation of serving size versus portion size, changes in portions sizes over the last 20 years, and what consists of a serving from each food group.   Stress Management:  -Group instruction provided by verbal instruction, video, and written materials to support subject matter.  Instructors review role of stress in heart disease and how to cope with stress positively.   Flowsheet Row CARDIAC REHAB PHASE II EXERCISE from 05/07/2018 in [place]  Date  04/11/18  Instruction Review Code   2- Demonstrated Understanding      Exercising on Your Own:  -Group instruction provided by verbal instruction, power point, and written materials to support subject.  Instructors discuss benefits of exercise, components of exercise, frequency and intensity of exercise, and end points for exercise.  Also discuss use of nitroglycerin and activating EMS.  Review options of places to exercise outside of rehab.  Review guidelines for sex with heart disease. Flowsheet Row CARDIAC REHAB PHASE II EXERCISE from 05/07/2018 in [place]  Date  05/07/18  Educator  EP  Instruction Review Code  2- Demonstrated Understanding      Cardiac Drugs I:  -Group instruction provided by verbal instruction and written materials to support subject.  Instructor reviews cardiac drug classes: antiplatelets, anticoagulants, beta blockers, and statins.  Instructor discusses reasons, side effects, and lifestyle considerations for each drug class. Flowsheet Row CARDIAC REHAB PHASE II EXERCISE from 05/07/2018 in [place]  Date  03/19/18  Instruction Review Code  2- Demonstrated Understanding      Cardiac Drugs II:  -Group instruction provided by verbal instruction and written materials to support subject.  Instructor reviews cardiac drug classes: angiotensin converting enzyme inhibitors (ACE-I), angiotensin II receptor blockers (ARBs), nitrates, and calcium channel blockers.  Instructor discusses reasons, side effects, and lifestyle considerations for each drug class. Flowsheet Row CARDIAC REHAB PHASE II EXERCISE from 05/07/2018 in [place]  Date  04/23/18  Instruction Review Code  2- Demonstrated Understanding      Anatomy and Physiology of the Circulatory System:  Group verbal and written instruction and models provide basic cardiac anatomy and physiology, with the coronary electrical and arterial systems. Review of: AMI,  Angina, Valve disease, Heart Failure, Peripheral Artery Disease, Cardiac Arrhythmia, Pacemakers, and the ICD. Flowsheet Row CARDIAC REHAB PHASE II EXERCISE from 05/07/2018 in [place]  Date  04/16/18  Educator  RN  Instruction Review Code  2- Demonstrated Understanding      Other Education:  -Group or individual verbal, written, or video instructions that support the educational goals of the cardiac rehab program.   Holiday Eating Survival Tips:  -Group instruction provided by PowerPoint slides, verbal discussion, and written materials to support subject matter. The instructor gives patients tips, tricks, and techniques to help them not only survive but enjoy the holidays despite the onslaught of food that accompanies the holidays.   Knowledge Questionnaire Score: Knowledge Questionnaire Score - 03/11/18 0955    Knowledge Questionnaire Score          Pre Score  21/24           Core Components/Risk Factors/Patient Goals at Admission: Personal Goals and Risk Factors at Admission - 03/11/18 1001  Core Components/Risk Factors/Patient Goals on Admission           Weight Management  Yes    Intervention  Weight Management: Develop a combined nutrition and exercise program designed to reach desired caloric intake, while maintaining appropriate intake of nutrient and fiber, sodium and fats, and appropriate energy expenditure required for the weight goal.;Weight Management: Provide education and appropriate resources to help participant work on and attain dietary goals.;Weight Management/Obesity: Establish reasonable short term and long term weight goals.    Admit Weight  191 lb 12.8 oz (87 kg)    Expected Outcomes  Short Term: Continue to assess and modify interventions until short term weight is achieved;Long Term: Adherence to nutrition and physical activity/exercise program aimed toward attainment of established weight goal;Weight Maintenance:  Understanding of the daily nutrition guidelines, which includes 25-35% calories from fat, 7% or less cal from saturated fats, less than 265m cholesterol, less than 1.5gm of sodium, & 5 or more servings of fruits and vegetables daily;Understanding recommendations for meals to include 15-35% energy as protein, 25-35% energy from fat, 35-60% energy from carbohydrates, less than 2028mof dietary cholesterol, 20-35 gm of total fiber daily;Understanding of distribution of calorie intake throughout the day with the consumption of 4-5 meals/snacks;Weight Loss: Understanding of general recommendations for a balanced deficit meal plan, which promotes 1-2 lb weight loss per week and includes a negative energy balance of [phone] kcal/d    Hypertension  Yes    Intervention  Provide education on lifestyle modifcations including regular physical activity/exercise, weight management, moderate sodium restriction and increased consumption of fresh fruit, vegetables, and low fat dairy, alcohol moderation, and smoking cessation.;Monitor prescription use compliance.    Expected Outcomes  Short Term: Continued assessment and intervention until BP is < 140/9014mG in hypertensive participants. < 130/24m30m in hypertensive participants with diabetes, heart failure or chronic kidney disease.;Long Term: Maintenance of blood pressure at goal levels.    Lipids  Yes    Intervention  Provide education and support for participant on nutrition & aerobic/resistive exercise along with prescribed medications to achieve LDL <70mg42mL >40mg.9mExpected Outcomes  Short Term: Participant states understanding of desired cholesterol values and is compliant with medications prescribed. Participant is following exercise prescription and nutrition guidelines.;Long Term: Cholesterol controlled with medications as prescribed, with individualized exercise RX and with personalized nutrition plan. Value goals: LDL < 70mg, 23m> 40 mg.    Stress  Yes     Intervention  Offer individual and/or small group education and counseling on adjustment to heart disease, stress management and health-related lifestyle change. Teach and support self-help strategies.;Refer participants experiencing significant psychosocial distress to appropriate mental health specialists for further evaluation and treatment. When possible, include family members and significant others in education/counseling sessions.    Expected Outcomes  Short Term: Participant demonstrates changes in health-related behavior, relaxation and other stress management skills, ability to obtain effective social support, and compliance with psychotropic medications if prescribed.;Long Term: Emotional wellbeing is indicated by absence of clinically significant psychosocial distress or social isolation.           Core Components/Risk Factors/Patient Goals Review:  Goals and Risk Factor Review    Core Components/Risk Factors/Patient Goals Review    Row Name 03/17/18 0939 [id number] 05/13/18 1338   Personal Goals Review  Weight Management/Obesity;Hypertension;Lipids;Stress  Weight Management/Obesity;Hypertension;Lipids;Stress  Weight Management/Obesity;Hypertension;Lipids;Stress   Review  pt with multiple CAD RF demonstrates eagerness to participate in CR program.  pt personal goals  are to improve heart health and lose weight.   pt with multiple CAD RF demonstrates eagerness to participate in CR program.  pt personal goals are to improve heart health and lose weight. pt reports increased energy with daily activities. pt is currently walking 3 miles for HEP on off days from CR.   pt with multiple CAD RF demonstrates eagerness to participate in CR program.  pt personal goals are to improve heart health and lose weight. pt reports increased energy with daily activities. pt is currently walking 3 miles for HEP on off days from CR.    Expected Outcomes  pt will participate in CR exercise, nutrition and  lifestyle modification opportunities to decrease overall RF.   pt will participate in CR exercise, nutrition and lifestyle modification opportunities to decrease overall RF.   pt will participate in CR exercise, nutrition and lifestyle modification opportunities to decrease overall RF.           Core Components/Risk Factors/Patient Goals at Discharge (Final Review):  Goals and Risk Factor Review - 05/13/18 1338    Core Components/Risk Factors/Patient Goals Review          Personal Goals Review  Weight Management/Obesity;Hypertension;Lipids;Stress    Review  pt with multiple CAD RF demonstrates eagerness to participate in CR program.  pt personal goals are to improve heart health and lose weight. pt reports increased energy with daily activities. pt is currently walking 3 miles for HEP on off days from CR.     Expected Outcomes  pt will participate in CR exercise, nutrition and lifestyle modification opportunities to decrease overall RF.            ITP Comments: ITP Comments    Row Name 03/11/18 0910 03/17/18 0942 03/19/18 1508 04/16/18 1205 05/13/18 1338   ITP Comments  Dr. Fransico Him, Medical Director   pt started group exercise session today.  pt tolerated light activity without difficulty.  pt oriented to exercise equipment and safety routine. understanding verbalized.   30 day ITP review.   30 day ITP review. pt with good attendance and participation.  pt demonstrates eagerness to participate in CR program.   30 day ITP review. pt with good attendance and participation.  pt demonstrates eagerness to participate in CR program.       Comments:

## 2018-05-16 ENCOUNTER — Encounter (HOSPITAL_COMMUNITY)
Admission: RE | Admit: 2018-05-16 | Discharge: 2018-05-16 | Disposition: A | Discharge status: Home / Self Care | Payer: Managed Care, Other (non HMO) | Source: Ambulatory Visit | Attending: Cardiology | Admitting: Cardiology

## 2018-05-16 ENCOUNTER — Ambulatory Visit (HOSPITAL_COMMUNITY): Payer: Self-pay

## 2018-05-16 VITALS — Ht 66.0 in | Wt 189.6 lb

## 2018-05-16 DIAGNOSIS — Z951 Presence of aortocoronary bypass graft: Secondary | ICD-10-CM

## 2018-05-16 DIAGNOSIS — I214 Non-ST elevation (NSTEMI) myocardial infarction: Secondary | ICD-10-CM

## 2018-05-19 ENCOUNTER — Ambulatory Visit (HOSPITAL_COMMUNITY): Payer: Self-pay

## 2018-05-19 ENCOUNTER — Encounter (HOSPITAL_COMMUNITY)
Admission: RE | Admit: 2018-05-19 | Discharge: 2018-05-19 | Disposition: A | Discharge status: Home / Self Care | Payer: Managed Care, Other (non HMO) | Source: Ambulatory Visit | Attending: Cardiology | Admitting: Cardiology

## 2018-05-19 DIAGNOSIS — I214 Non-ST elevation (NSTEMI) myocardial infarction: Secondary | ICD-10-CM

## 2018-05-19 DIAGNOSIS — Z951 Presence of aortocoronary bypass graft: Secondary | ICD-10-CM

## 2018-05-21 ENCOUNTER — Ambulatory Visit (HOSPITAL_COMMUNITY): Payer: Self-pay

## 2018-05-21 ENCOUNTER — Other Ambulatory Visit: Payer: Self-pay

## 2018-05-21 ENCOUNTER — Encounter (HOSPITAL_COMMUNITY)
Admission: RE | Admit: 2018-05-21 | Discharge: 2018-05-21 | Disposition: A | Discharge status: Home / Self Care | Payer: Managed Care, Other (non HMO) | Source: Ambulatory Visit | Attending: Cardiology | Admitting: Cardiology

## 2018-05-21 DIAGNOSIS — Z951 Presence of aortocoronary bypass graft: Secondary | ICD-10-CM

## 2018-05-21 DIAGNOSIS — I214 Non-ST elevation (NSTEMI) myocardial infarction: Secondary | ICD-10-CM

## 2018-05-23 ENCOUNTER — Encounter (HOSPITAL_COMMUNITY)
Admission: RE | Admit: 2018-05-23 | Discharge: 2018-05-23 | Disposition: A | Discharge status: Home / Self Care | Payer: Managed Care, Other (non HMO) | Source: Ambulatory Visit | Attending: Cardiology | Admitting: Cardiology

## 2018-05-23 ENCOUNTER — Other Ambulatory Visit: Payer: Self-pay

## 2018-05-23 ENCOUNTER — Ambulatory Visit (HOSPITAL_COMMUNITY): Payer: Self-pay

## 2018-05-23 DIAGNOSIS — Z951 Presence of aortocoronary bypass graft: Secondary | ICD-10-CM

## 2018-05-23 DIAGNOSIS — I214 Non-ST elevation (NSTEMI) myocardial infarction: Secondary | ICD-10-CM

## 2018-05-26 ENCOUNTER — Encounter (HOSPITAL_COMMUNITY): Payer: Managed Care, Other (non HMO)

## 2018-05-26 ENCOUNTER — Telehealth (HOSPITAL_COMMUNITY): Payer: Self-pay | Admitting: Cardiac Rehabilitation

## 2018-05-26 ENCOUNTER — Ambulatory Visit (HOSPITAL_COMMUNITY): Payer: Self-pay

## 2018-05-26 NOTE — Telephone Encounter (Signed)
Phone call to pt to advise of CR Phase II departmental closing for 2 weeks.  Pt verbalized understanding.  Joann Rion, RN, BSN Cardiac Pulmonary Rehab 

## 2018-05-28 ENCOUNTER — Ambulatory Visit (HOSPITAL_COMMUNITY): Payer: Self-pay

## 2018-05-28 ENCOUNTER — Encounter (HOSPITAL_COMMUNITY): Payer: Managed Care, Other (non HMO)

## 2018-05-30 ENCOUNTER — Encounter (HOSPITAL_COMMUNITY): Payer: Managed Care, Other (non HMO)

## 2018-05-30 ENCOUNTER — Ambulatory Visit (HOSPITAL_COMMUNITY): Payer: Self-pay

## 2018-06-02 ENCOUNTER — Ambulatory Visit (HOSPITAL_COMMUNITY): Payer: Self-pay

## 2018-06-02 ENCOUNTER — Telehealth (HOSPITAL_COMMUNITY): Payer: Self-pay

## 2018-06-02 ENCOUNTER — Encounter (HOSPITAL_COMMUNITY): Payer: Managed Care, Other (non HMO)

## 2018-06-02 NOTE — Telephone Encounter (Signed)
Phone call made to inform Pt about closure of Cardiac Rehab for additional 4 weeks due to [place]. Tentative reopen on on 06/30/18. Pt verbalized understanding. Pt decided to withdraw from the program since Pt only had a few sessions left to complete. Pt will be discharged from the program.

## 2018-06-03 NOTE — Progress Notes (Signed)
Discharge Progress Report  Patient Details  Name: Paul Duncan MRN: [id number] Date of Birth: 03/23/1975 Referring Provider:   Flowsheet Row CARDIAC REHAB PHASE II ORIENTATION from 03/11/2018 in [place]  Referring Provider  Dr. Martinique       Number of Visits: 31   Reason for Discharge:  Patient has met program and personal goals.  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 Martinique    Expected Discharge Date  06/20/18        Bike          Level  1.2    Minutes  10    METs  3.66        NuStep          Level  4    SPM  85    Minutes  10    METs  4.4        Track          Laps  10    Minutes  10    METs  2.76        Prescription Details          Frequency (times per week)  3    Duration  Progress to 30 minutes of continuous aerobic without signs/symptoms of physical distress        Intensity          THRR 40-80% of Max Heartrate  70-141    Ratings of Perceived Exertion  11-13        Progression          Progression  Continue to progress workloads to maintain intensity without signs/symptoms of physical distress.        Resistance Training          Training Prescription  Yes    Weight  4 lbs.    Reps  10-15           Discharge Exercise Prescription (Final Exercise Prescription Changes): Exercise Prescription Changes - 05/23/18 0909    Response to Exercise          Blood Pressure (Admit)  120/80    Blood Pressure (Exercise)  124/70    Blood Pressure (Exit)  108/72    Heart Rate (Admit)  74 bpm    Heart  Rate (Exercise)  114 bpm    Heart Rate (Exit)  74 bpm    Rating of Perceived Exertion (Exercise)  12    Perceived Dyspnea (Exercise)  0    Symptoms  None    Duration  Progress to 30 minutes of  aerobic without signs/symptoms of physical distress    Intensity  THRR unchanged        Progression          Progression  Continue to progress workloads to maintain intensity without signs/symptoms of physical distress.    Average METs  4.5        Resistance Training          Training Prescription  Yes    Weight  6 lbs.     Reps  10-15    Time  10 Minutes        Interval Training          Interval Training  No        Bike          Level  1.2    Minutes  10    METs  3.66        NuStep          Level  7    SPM  85    Minutes  10    METs  5.7        Track          Laps  16    Minutes  10    METs  4.13        Home Exercise Plan          Plans to continue exercise at  Home (comment)    Frequency  Add 4 additional days to program exercise sessions.    Initial Home Exercises Provided  03/31/18           Functional Capacity: 6 Minute Walk    6 Minute Walk    Row Name 03/11/18 1003 05/16/18 1455   Phase  Initial  Discharge   Distance  1573 feet  2279 feet   Distance % Change  no documentation  44.88 %   Distance Feet Change  no documentation  706 ft   Walk Time  6 minutes  6 minutes   # of Rest Breaks  0  0   MPH  2.9  4.3   METS  4.43  5.9   RPE  12  11   Perceived Dyspnea   no documentation  0   VO2 Peak  15.51  20.93   Symptoms  No  No   Resting HR  88 bpm  66 bpm   Resting BP  128/80  110/64   Resting Oxygen Saturation   100 %  no documentation   Exercise Oxygen Saturation  during 6 min walk  98 %  no documentation   Max Ex. HR  84 bpm  102 bpm   Max Ex. BP  132/78  148/78   2 Minute Post BP  118/68  100/60          Psychological, QOL, Others - Outcomes: PHQ 2/9: Depression screen PHQ 2/9 03/17/2018  Decreased Interest 0  Down, Depressed,  Hopeless 0  PHQ - 2 Score 0    Quality of Life: Quality of Life - 03/11/18 1000    Quality of Life          Select  Quality of Life        Quality of Life Scores          Health/Function Pre  24.11 %    Socioeconomic Pre  22.71 %    Psych/Spiritual Pre  23.57 %    Family Pre  23.5 %    GLOBAL Pre  23.61 %           Personal Goals: Goals established at orientation with interventions provided to work toward goal. Personal Goals and Risk Factors at Admission - 03/11/18 1001    Core Components/Risk Factors/Patient Goals on Admission           Weight Management  Yes    Intervention  Weight Management: Develop a combined nutrition and exercise program designed to reach desired caloric  intake, while maintaining appropriate intake of nutrient and fiber, sodium and fats, and appropriate energy expenditure required for the weight goal.;Weight Management: Provide education and appropriate resources to help participant work on and attain dietary goals.;Weight Management/Obesity: Establish reasonable short term and long term weight goals.    Admit Weight  191 lb 12.8 oz (87 kg)    Expected Outcomes  Short Term: Continue to assess and modify interventions until short term weight is achieved;Long Term: Adherence to nutrition and physical activity/exercise program aimed toward attainment of established weight goal;Weight Maintenance: Understanding of the daily nutrition guidelines, which includes 25-35% calories from fat, 7% or less cal from saturated fats, less than 221m cholesterol, less than 1.5gm of sodium, & 5 or more servings of fruits and vegetables daily;Understanding recommendations for meals to include 15-35% energy as protein, 25-35% energy from fat, 35-60% energy from carbohydrates, less than 2011mof dietary cholesterol, 20-35 gm of total fiber daily;Understanding of distribution of calorie intake throughout the day with the consumption of 4-5 meals/snacks;Weight Loss: Understanding of  general recommendations for a balanced deficit meal plan, which promotes 1-2 lb weight loss per week and includes a negative energy balance of [phone] kcal/d    Hypertension  Yes    Intervention  Provide education on lifestyle modifcations including regular physical activity/exercise, weight management, moderate sodium restriction and increased consumption of fresh fruit, vegetables, and low fat dairy, alcohol moderation, and smoking cessation.;Monitor prescription use compliance.    Expected Outcomes  Short Term: Continued assessment and intervention until BP is < 140/9020mG in hypertensive participants. < 130/22m60m in hypertensive participants with diabetes, heart failure or chronic kidney disease.;Long Term: Maintenance of blood pressure at goal levels.    Lipids  Yes    Intervention  Provide education and support for participant on nutrition & aerobic/resistive exercise along with prescribed medications to achieve LDL <70mg7mL >40mg.56mExpected Outcomes  Short Term: Participant states understanding of desired cholesterol values and is compliant with medications prescribed. Participant is following exercise prescription and nutrition guidelines.;Long Term: Cholesterol controlled with medications as prescribed, with individualized exercise RX and with personalized nutrition plan. Value goals: LDL < 70mg, 26m> 40 mg.    Stress  Yes    Intervention  Offer individual and/or small group education and counseling on adjustment to heart disease, stress management and health-related lifestyle change. Teach and support self-help strategies.;Refer participants experiencing significant psychosocial distress to appropriate mental health specialists for further evaluation and treatment. When possible, include family members and significant others in education/counseling sessions.    Expected Outcomes  Short Term: Participant demonstrates changes in health-related behavior, relaxation and other stress  management skills, ability to obtain effective social support, and compliance with psychotropic medications if prescribed.;Long Term: Emotional wellbeing is indicated by absence of clinically significant psychosocial distress or social isolation.            Personal Goals Discharge: Goals and Risk Factor Review    Core Components/Risk Factors/Patient Goals Review    Row Name 03/17/18 0939 [id number] 05/13/18 1338 05/28/18 1131   Personal Goals Review  Weight Management/Obesity;Hypertension;Lipids;Stress  Weight Management/Obesity;Hypertension;Lipids;Stress  Weight Management/Obesity;Hypertension;Lipids;Stress  Weight Management/Obesity;Hypertension;Lipids;Stress   Review  pt with multiple CAD RF demonstrates eagerness to participate in CR program.  pt personal goals are to improve heart health and lose weight.   pt with multiple CAD RF demonstrates eagerness to participate in CR program.  pt personal goals are to improve heart health and lose weight. pt reports  increased energy with daily activities. pt is currently walking 3 miles for HEP on off days from CR.   pt with multiple CAD RF demonstrates eagerness to participate in CR program.  pt personal goals are to improve heart health and lose weight. pt reports increased energy with daily activities. pt is currently walking 3 miles for HEP on off days from CR.   pt with multiple CAD RF demonstrates eagerness to participate in CR program.  pt personal goals are to improve heart health and lose weight. pt reports increased energy with daily activities. pt is currently walking 3 miles for HEP on off days from CR.    Expected Outcomes  pt will participate in CR exercise, nutrition and lifestyle modification opportunities to decrease overall RF.   pt will participate in CR exercise, nutrition and lifestyle modification opportunities to decrease overall RF.   pt will participate in CR exercise, nutrition and lifestyle modification opportunities to decrease  overall RF.   pt will participate in CR exercise, nutrition and lifestyle modification opportunities to decrease overall RF.           Exercise Goals and Review: Exercise Goals    Exercise Goals    Row Name 03/11/18 1006   Increase Physical Activity  Yes   Intervention  Provide advice, education, support and counseling about physical activity/exercise needs.;Develop an individualized exercise prescription for aerobic and resistive training based on initial evaluation findings, risk stratification, comorbidities and participant's personal goals.   Expected Outcomes  Short Term: Attend rehab on a regular basis to increase amount of physical activity.   Increase Strength and Stamina  Yes   Intervention  Provide advice, education, support and counseling about physical activity/exercise needs.;Develop an individualized exercise prescription for aerobic and resistive training based on initial evaluation findings, risk stratification, comorbidities and participant's personal goals.   Expected Outcomes  Short Term: Increase workloads from initial exercise prescription for resistance, speed, and METs.   Able to understand and use rate of perceived exertion (RPE) scale  Yes   Intervention  Provide education and explanation on how to use RPE scale   Expected Outcomes  Short Term: Able to use RPE daily in rehab to express subjective intensity level;Long Term:  Able to use RPE to guide intensity level when exercising independently   Knowledge and understanding of Target Heart Rate Range (THRR)  Yes   Intervention  Provide education and explanation of THRR including how the numbers were predicted and where they are located for reference   Expected Outcomes  Short Term: Able to state/look up THRR;Long Term: Able to use THRR to govern intensity when exercising independently;Short Term: Able to use daily as guideline for intensity in rehab   Able to check pulse independently  Yes   Intervention  Provide  education and demonstration on how to check pulse in carotid and radial arteries.;Review the importance of being able to check your own pulse for safety during independent exercise   Expected Outcomes  Short Term: Able to explain why pulse checking is important during independent exercise;Long Term: Able to check pulse independently and accurately   Understanding of Exercise Prescription  Yes   Intervention  Provide education, explanation, and written materials on patient's individual exercise prescription   Expected Outcomes  Short Term: Able to explain program exercise prescription;Long Term: Able to explain home exercise prescription to exercise independently          Exercise Goals Re-Evaluation: Exercise Goals Re-Evaluation    Exercise Goal Re-Evaluation  [place] Name 03/31/18 4496 04/16/18 1003 05/13/18 1422 05/29/18 0910   Exercise Goals Review  Increase Physical Activity;Increase Strength and Stamina;Able to understand and use rate of perceived exertion (RPE) scale;Knowledge and understanding of Target Heart Rate Range (THRR);Understanding of Exercise Prescription;Able to check pulse independently  Increase Physical Activity;Increase Strength and Stamina;Able to understand and use rate of perceived exertion (RPE) scale;Knowledge and understanding of Target Heart Rate Range (THRR);Understanding of Exercise Prescription;Able to check pulse independently  Increase Physical Activity;Increase Strength and Stamina;Able to understand and use rate of perceived exertion (RPE) scale;Knowledge and understanding of Target Heart Rate Range (THRR);Understanding of Exercise Prescription;Able to check pulse independently  Increase Physical Activity;Increase Strength and Stamina;Able to understand and use rate of perceived exertion (RPE) scale;Knowledge and understanding of Target Heart Rate Range (THRR);Understanding of Exercise Prescription;Able to check pulse independently   Comments  Reviewed HEP with Pt. Pt  was responsive and understood goals. Pt is wlaking 7 days per week for 30-45 minutes.   Reviewed METs and goals with Pt. MET level is a 4.6 and continues to increase gradually. Pt is tolerating exercise Rx well and increasing levels as tolerated. Pt is walking 50 minutes per day in addition to the program.   Reviewed METs and goals with Pt. MET level is a 4.6 and continues to increase gradually. Pt is tolerating exercise Rx well and increasing levels as tolerated. Pt is walking 50 minutes per day in addition to the program.   Unable to review METs and goals with Pt due to closure of Cardiac Rehab for [place]. Pt MET level is 4.5 and is progressing well. Pt is tolerating exercise Rx well.    Expected Outcomes  Will continue to monitor and progress Pt as tolerated.   Will continue to monitor and progress Pt as tolerated.   Will continue to monitor and progress Pt as tolerated.   Will continue to monitor and progress Pt as tolerated.           Nutrition & Weight - Outcomes: Pre Biometrics - 03/11/18 1007    Pre Biometrics          Height  '5\' 6"'  (1.676 m)    Weight  87 kg    Waist Circumference  40.5 inches    Hip Circumference  41.5 inches    Waist to Hip Ratio  0.98 %    BMI (Calculated)  30.97    Triceps Skinfold  33 mm    % Body Fat  31.7 %    Grip Strength  33 kg    Flexibility  14 in    Single Leg Stand  1.65 seconds          Post Biometrics - 05/16/18 1458     Post  Biometrics          Height  '5\' 6"'  (1.676 m)    Weight  86 kg    Waist Circumference  39.5 inches    Hip Circumference  39.5 inches    Waist to Hip Ratio  1 %    BMI (Calculated)  30.62    Triceps Skinfold  33 mm    % Body Fat  31.1 %    Grip Strength  30 kg    Flexibility  13 in    Single Leg Stand  30 seconds           Nutrition: Nutrition Therapy & Goals - 06/03/18 0822    Nutrition Therapy  Diet  heart healthy         Personal Nutrition Goals          Nutrition Goal  Pt to identify and  limit food sources of saturated fat, trans fat, refined carbohydrates and sodium    Personal Goal #2  Pt to identify food quantities necessary to achieve weight loss of 6-24 lb at graduation from cardiac rehab.     Personal Goal #3  Pt to meal prep and plan ahead of time/ batch cook        Intervention Plan          Intervention  Prescribe, educate and counsel regarding individualized specific dietary modifications aiming towards targeted core components such as weight, hypertension, lipid management, diabetes, heart failure and other comorbidities.    Expected Outcomes  Short Term Goal: Understand basic principles of dietary content, such as calories, fat, sodium, cholesterol and nutrients.;Long Term Goal: Adherence to prescribed nutrition plan.           Nutrition Discharge: Nutrition Assessments - 06/03/18 0821    MEDFICTS Scores          Pre Score  16    Post Score  --   pt did not return post survey          Education Questionnaire Score: Knowledge Questionnaire Score - 03/11/18 0955    Knowledge Questionnaire Score          Pre Score  21/24           Goals reviewed with patient; copy given to patient.

## 2018-06-04 ENCOUNTER — Encounter (HOSPITAL_COMMUNITY): Payer: Managed Care, Other (non HMO)

## 2018-06-04 ENCOUNTER — Ambulatory Visit (HOSPITAL_COMMUNITY): Payer: Self-pay

## 2018-06-06 ENCOUNTER — Encounter (HOSPITAL_COMMUNITY): Payer: Managed Care, Other (non HMO)

## 2018-06-06 ENCOUNTER — Ambulatory Visit (HOSPITAL_COMMUNITY): Payer: Self-pay

## 2018-06-09 ENCOUNTER — Encounter (HOSPITAL_COMMUNITY): Payer: Managed Care, Other (non HMO)

## 2018-06-09 ENCOUNTER — Ambulatory Visit (HOSPITAL_COMMUNITY): Payer: Self-pay

## 2018-06-11 ENCOUNTER — Ambulatory Visit (HOSPITAL_COMMUNITY): Payer: Self-pay

## 2018-06-11 ENCOUNTER — Encounter (HOSPITAL_COMMUNITY): Payer: Managed Care, Other (non HMO)

## 2018-06-13 ENCOUNTER — Encounter (HOSPITAL_COMMUNITY): Payer: Managed Care, Other (non HMO)

## 2018-06-13 ENCOUNTER — Ambulatory Visit (HOSPITAL_COMMUNITY): Payer: Self-pay

## 2018-06-16 ENCOUNTER — Encounter (HOSPITAL_COMMUNITY): Payer: Managed Care, Other (non HMO)

## 2018-06-16 ENCOUNTER — Ambulatory Visit (HOSPITAL_COMMUNITY): Payer: Self-pay

## 2018-06-18 ENCOUNTER — Encounter (HOSPITAL_COMMUNITY): Payer: Managed Care, Other (non HMO)

## 2018-06-18 ENCOUNTER — Ambulatory Visit (HOSPITAL_COMMUNITY): Payer: Self-pay

## 2018-12-09 NOTE — Progress Notes (Signed)
Cardiology Office Note    Date:  12/10/2018   ID:  Paul Duncan, DOB [birth year], MRN [id number]  PCP:  [name], PA  Cardiologist:  Dr. Martinique  Chief Complaint  Patient presents with  . Coronary Artery Disease    History of Present Illness:  Paul Duncan is a 43 y.o. male with PMH of HTN, HLD and brain tumor s/p resection.  He was  admitted to the hospital on 12/18/2017 with a  NSTEMI.    Patient underwent cardiac catheterization on 12/18/2017 which showed EF 25 to 35%, 60% mid RCA lesion, 100% ostial OM 2 lesion, 30% ostial OM1 lesion, 100% mid LAD occlusion, 99% proximal LAD stenosis.  Bypass surgery was recommended if distal LAD territory is proven viable.  Echocardiogram obtained on the same day showed EF 40 to 45%, mild LVH, akinesis of the apical anterior and apical myocardium, akinesis of the mid anteroseptal myocardium.  MRI viability test shows there was substantial viability of the anterior wall on with only small area of scar at the apex.  Therefore the decision was made to proceed with bypass surgery.  Due to complication that a sheath for the Deberah Pelton catheter was placed in the right common carotid artery, vascular surgery was called and the patient had to undergo exploration of the right neck, repair of the right internal jugular vein, and repair of the injury to the origin of the right vertebral artery on 12/23/2017.  Patient underwent CABG x5 with LIMA to LAD, SVG to diagonal, SVG to PDA and sequential SVG to OM1 and OM 2 by Dr. Servando Snare.   On follow up Echo in January 2020 there was significant improvement in LV function with EF 50-55%.  Initially after CABG BP was running low and medication was reduced. He did complete Cardiac Rehab and is walking some. Has a rowing machine at home.  He is back working full time. He has slipped on his diet and has gained 12 lbs. Reports BP at home 130-80.    Past Medical History:  Diagnosis Date  . Brain tumor ([place])   .  Hypertension   . Seizures ([place])     Past Surgical History:  Procedure Laterality Date  . BRAIN SURGERY    . BRAIN SURGERY  [id number]  . CORONARY ARTERY BYPASS GRAFT N/A 12/23/2017   Procedure: CORONARY ARTERY BYPASS GRAFTING (CABG) x 5: Using Left Internal Mammary Artery (LIMA) and Endoscopically Harvested Right Thigh/Calf and Left Thigh Greater Saphenous Vein Grafts (SVG);   LIMA to LAD, SVG to Diag1, SVG to PDA, SVG to OM1 and OM2; / and Repair of Vertebral Laceration and Removal of [name] Introducer;  Surgeon: [name], MD;  Location: [facility];  Service: Open Hear  . LEFT HEART CATH AND CORONARY ANGIOGRAPHY N/A 12/18/2017   Procedure: LEFT HEART CATH AND CORONARY ANGIOGRAPHY;  Surgeon: [name], MD;  Location: [facility];  Service: Cardiovascular;  Laterality: N/A;  . TEE WITHOUT CARDIOVERSION N/A 12/23/2017   Procedure: TRANSESOPHAGEAL ECHOCARDIOGRAM (TEE);  Surgeon: [name], MD;  Location: [facility];  Service: Open Heart Surgery;  Laterality: N/A;    Current Medications: Outpatient Medications Prior to Visit  Medication Sig Dispense Refill  . aspirin EC 81 MG tablet Take 81 mg by mouth daily.    . metoprolol tartrate (LOPRESSOR) 50 MG tablet Take 0.5 tablets (25 mg total) by mouth 2 (two) times daily. 90 tablet 3  . atorvastatin (LIPITOR) 80 MG  tablet Take 1 tablet (80 mg total) by mouth daily at 6 PM. 30 tablet 1  . aspirin EC 325 MG EC tablet Take 1 tablet (325 mg total) by mouth daily. (Patient not taking: Reported on 12/10/2018) 30 tablet 0   No facility-administered medications prior to visit.      Allergies:   Patient has no known allergies.   Social History   Socioeconomic History  . Marital status: Single    Spouse name: Not on file  . Number of children: Not on file  . Years of education: Not on file  . Highest education level: Not on file  Occupational History  . Not on file  Social Needs  . Financial resource strain: Not on file   . Food insecurity    Worry: Not on file    Inability: Not on file  . Transportation needs    Medical: Not on file    Non-medical: Not on file  Tobacco Use  . Smoking status: Never Smoker  . Smokeless tobacco: Never Used  Substance and Sexual Activity  . Alcohol use: No  . Drug use: No  . Sexual activity: Not on file  Lifestyle  . Physical activity    Days per week: Not on file    Minutes per session: Not on file  . Stress: Not on file  Relationships  . Social Herbalist on phone: Not on file    Gets together: Not on file    Attends religious service: Not on file    Active member of club or organization: Not on file    Attends meetings of clubs or organizations: Not on file    Relationship status: Not on file  Other Topics Concern  . Not on file  Social History Narrative   ** Merged History Encounter **         Family History:  The patient's family history includes Diabetes in his mother; Heart disease in his father.   ROS:   Please see the history of present illness.    ROS All other systems reviewed and are negative.   PHYSICAL EXAM:   VS:  BP (!) 141/88   Pulse (!) 56   Temp (!) 97.1 F (36.2 C)   Ht 5\' 6"  (1.676 m)   Wt 204 lb (92.5 kg)   SpO2 100%   BMI 32.93 kg/m    GEN: Well nourished, well developed, in no acute distress  HEENT: normal  Neck: no JVD, carotid bruits, or masses Cardiac: RRR; no murmurs, rubs, or gallops,no edema. Sternotomy scar is healing well. Respiratory:  clear to auscultation bilaterally, normal work of breathing GI: soft, nontender, nondistended, + BS MS: no deformity or atrophy  Skin: warm and dry, no rash Neuro:  Alert and Oriented x 3, Strength and sensation are intact Psych: euthymic mood, full affect  Wt Readings from Last 3 Encounters:  12/10/18 204 lb (92.5 kg)  05/16/18 189 lb 9.5 oz (86 kg)  03/26/18 192 lb 6 oz (87.3 kg)      Studies/Labs Reviewed:   EKG:  EKG is ordered today.  NSR rate 61. Normal  Ecg. I have personally reviewed and interpreted this study.   Recent Labs: 12/24/2017: Magnesium 2.1 01/10/2018: BUN 14; Creatinine, Ser 0.96; Hemoglobin 11.4; Platelets 548; Potassium 4.8; Sodium 138 03/10/2018: ALT 18   Lipid Panel    Component Value Date/Time   CHOL 120 03/10/2018 0818   TRIG 120 03/10/2018 0818   HDL 31 (  L) 03/10/2018 0818   CHOLHDL 3.9 03/10/2018 0818   CHOLHDL 6.1 12/18/2017 0839   VLDL 40 12/18/2017 0839   LDLCALC 65 03/10/2018 0818   Dated 10/16/18: cholesterol 123, triglycerides 96, HDL 36, LDL 68. A1c 5,5%, CMET and CBC normal  Additional studies/ records that were reviewed today include:   Cath 12/18/2017  There is moderate to severe left ventricular systolic dysfunction.  LV end diastolic pressure is moderately elevated.  The left ventricular ejection fraction is 25-35% by visual estimate.  Mid RCA lesion is 60% stenosed.  Ost 2nd Mrg lesion is 100% stenosed.  Ost 1st Mrg lesion is 30% stenosed.  Mid LAD lesion is 100% stenosed.  Prox LAD lesion is 99% stenosed.   1.  Significant three-vessel coronary artery disease as outlined above. 2.  Severely reduced LV systolic function with an EF of 25 to 35%. 3.  Moderately elevated left ventricular end-diastolic pressure.  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  If the mid to distal LAD myocardium is viable, I think his best option is CABG.  If in the other hand there is no viable myocardium in that area, PCI of proximal LAD plus fractional flow reserve guided revascularization of the RCA can be considered.   Echo 12/18/2017 LV EF: 40% -   45% Study Conclusions  - Left ventricle: The cavity size was normal. There was mild   concentric hypertrophy. Systolic function was mildly to   moderately reduced. The estimated ejection fraction was in the   range of 40% to 45%. There is akinesis of the  apicalanterior and   apical myocardium. There is akinesis of the midanteroseptal   myocardium. - Pulmonary arteries: Systolic pressure could not be accurately   estimated.    MR VIABILITY  FINDINGS: Limited images of the lung fields showed no gross abnormalities.  Normal left ventricular size with mild LV hypertrophy. Mid to apical anterior, anteroseptal and inferoseptal hypokinesis. Apical lateral and apical inferior hypokinesis. Severe hypokinesis of the true apex. LV EF 45%. Normal right ventricular size and systolic function, EF [id number]. Normal right and left atrial sizes. Trileaflet aortic valve with no stenosis, mild aortic insufficiency. No significant mitral regurgitation noted.  Delayed enhancement imaging: Patchy <25% wall thickness subendocardial late gadolinium enhancement (LGE) in the mid to apical anterior, anteroseptal, and inferoseptal walls.  Measurements:  LVEDV 132 mL  LVSV 59 mL  LVEF 45%  RVEDV 86 mL  RVSV 51 mL  RVEF 59%  IMPRESSION: 1. Normal LV size with mild LV hypertrophy. EF 45% with wall motion abnormalities as noted above.  2. Normal RV size and systolic function, EF [id number].  3. Delayed enhancement imaging was suggestive of viability of the hypokinetic wall segments.   Vascular surgery 12/23/2017 1.  Exploration of the right neck 2.  Repair of right internal jugular vein 3.  Repair of injury to the origin of the right vertebral artery   CABG 12/23/2017 PROCEDURE:  Procedure(s): CORONARY ARTERY BYPASS GRAFTING (CABG) x 5: Using Left Internal Mammary Artery (LIMA) and Endoscopically Harvested Right Thigh/Calf and Left Thigh Greater Saphenous Vein Grafts (SVG);   LIMA to LAD, SVG to Diag1, SVG to PDA, SVG to OM1 and OM2; / and Repair of Vertebral artery injury by preop line placement  and Removal of Swan Ganz Introducer (N/A) TRANSESOPHAGEAL ECHOCARDIOGRAM (TEE) (N/A)  LIMA to LAD SVG to Diag1 SVG to PDA SVG to  OM1 and OM2   Echo 04/03/18: Study Conclusions  - Left ventricle: The cavity size was normal. Systolic function was   normal. The estimated ejection fraction was in the range of 50%   to 55%. Hypokinesis of the apicalanteroseptal and apical   myocardium. Left ventricular diastolic function parameters were   normal. - Aortic valve: There was mild regurgitation. - Mitral valve: There was mild regurgitation. - Left atrium: The atrium was mildly dilated.  Impressions:  - Anteroapical wall motion abnormalities persist, but have improved   since the previous study.  ASSESSMENT:    1. Coronary artery disease involving coronary bypass graft of native heart without angina pectoris   2. S/P CABG x 5   3. Hyperlipidemia, unspecified hyperlipidemia type   4. Essential hypertension      PLAN:  In order of problems listed above:  1. CAD s/pNSTEMI: Found to have multivessel disease with severe disease in the LAD territory, MRI viability test showed the distal LAD territory was viable.  He underwent a CABG x5 October 2019.  He has recovered well and is asymptomatic. EF has recovered.  Continue aspirin and statin. Long term need to focus on lifestyle modification. Needs to lose weight.  2. Hypertension: mildly elevated today. Goal is < 130/80. Patient will monitor at home. If consistently elevated  I would add an ACEi or ARB.  3    Hyperlipidemia: on Lipitor 80 mg daily.  LDL 68 which is at goal < 70.   4.    Ischemic CM- pre op EF 40-45%., post revascularization EF improved to 50-55%.   Follow up in one year    Medication Adjustments/Labs and Tests Ordered: Current medicines are reviewed at length with the patient today.  Concerns regarding medicines are outlined above.  Medication changes, Labs and Tests ordered today are listed in the Patient Instructions below. There are no Patient Instructions on file for this visit.   Signed, Weslyn Holsonback Martinique, MD  12/10/2018 4:15 PM    [facility]  [facility], [place], [place]  [address] Phone: [phone]; Fax: [phone]

## 2018-12-10 ENCOUNTER — Other Ambulatory Visit: Payer: Self-pay

## 2018-12-10 ENCOUNTER — Encounter: Payer: Self-pay | Admitting: Cardiology

## 2018-12-10 ENCOUNTER — Ambulatory Visit (INDEPENDENT_AMBULATORY_CARE_PROVIDER_SITE_OTHER): Payer: Managed Care, Other (non HMO) | Admitting: Cardiology

## 2018-12-10 VITALS — BP 141/88 | HR 56 | Temp 97.1°F | Ht 66.0 in | Wt 204.0 lb

## 2018-12-10 DIAGNOSIS — I10 Essential (primary) hypertension: Secondary | ICD-10-CM | POA: Diagnosis not present

## 2018-12-10 DIAGNOSIS — E785 Hyperlipidemia, unspecified: Secondary | ICD-10-CM

## 2018-12-10 DIAGNOSIS — Z951 Presence of aortocoronary bypass graft: Secondary | ICD-10-CM | POA: Diagnosis not present

## 2018-12-10 DIAGNOSIS — I25810 Atherosclerosis of coronary artery bypass graft(s) without angina pectoris: Secondary | ICD-10-CM

## 2018-12-10 MED ORDER — ATORVASTATIN CALCIUM 80 MG PO TABS: 80.0000 mg | ORAL_TABLET | Freq: Every day | ORAL | 3 refills | Status: DC

## 2019-01-30 ENCOUNTER — Telehealth: Payer: Managed Care, Other (non HMO) | Admitting: Nurse Practitioner

## 2019-01-30 ENCOUNTER — Other Ambulatory Visit: Payer: Self-pay

## 2019-01-30 DIAGNOSIS — Z20822 Contact with and (suspected) exposure to covid-19: Secondary | ICD-10-CM

## 2019-01-30 DIAGNOSIS — R05 Cough: Secondary | ICD-10-CM

## 2019-01-30 DIAGNOSIS — R059 Cough, unspecified: Secondary | ICD-10-CM

## 2019-01-30 MED ORDER — PROMETHAZINE-DM 6.25-15 MG/5ML PO SYRP: 5.0000 mL | ORAL_SOLUTION | Freq: Four times a day (QID) | ORAL | 0 refills | Status: AC | PRN

## 2019-01-30 NOTE — Progress Notes (Signed)
E-Visit for Corona Virus Screening   Your current symptoms could be consistent with the coronavirus.  Many health care providers can now test patients at their office but not all are.  [facility] has multiple testing sites. For information on our COVID testing locations and hours go to [email or web address]  Please quarantine yourself while awaiting your test results.  We are enrolling you in our [facility] for [place] . Daily you will receive a questionnaire within the [facility] website. Our COVID 19 response team willl be monitoriing your responses daily. Please continue good preventive care measures, including:  frequent hand-washing, avoid touching your face, cover coughs/sneezes, stay out of crowds and keep a 6 foot distance from others.    COVID-19 is a respiratory illness with symptoms that are similar to the flu. Symptoms are typically mild to moderate, but there have been cases of severe illness and death due to the virus. The following symptoms may appear 2-14 days after exposure: . Fever . Cough . Shortness of breath or difficulty breathing . Chills . Repeated shaking with chills . Muscle pain . Headache . Sore throat . New loss of taste or smell . Fatigue . Congestion or runny nose . Nausea or vomiting . Diarrhea  If you develop fever/cough/breathlessness, please stay home for 10 days with improving symptoms and until you have had 24 hours of no fever (without taking a fever reducer).  Go to the nearest hospital ED for assessment if fever/cough/breathlessness are severe or illness seems like a threat to life.  It is vitally important that if you feel that you have an infection such as this virus or any other virus that you stay home and away from places where you may spread it to others.  You should avoid contact with people age 63 and older.   You should wear a mask or cloth face covering over your nose and mouth if you must be around other  people or animals, including pets (even at home). Try to stay at least 6 feet away from other people. This will protect the people around you.  You can use medication such as A prescription cough medication called Phenergan DM 6.25 mg/15 mg. You make take one teaspoon / 5 ml every 4-6 hours as needed for cough  You may also take acetaminophen (Tylenol) as needed for fever.   Reduce your risk of any infection by using the same precautions used for avoiding the common cold or flu:  Marland Kitchen Wash your hands often with soap and warm water for at least 20 seconds.  If soap and water are not readily available, use an alcohol-based hand sanitizer with at least 60% alcohol.  . If coughing or sneezing, cover your mouth and nose by coughing or sneezing into the elbow areas of your shirt or coat, into a tissue or into your sleeve (not your hands). . Avoid shaking hands with others and consider head nods or verbal greetings only. . Avoid touching your eyes, nose, or mouth with unwashed hands.  . Avoid close contact with people who are sick. . Avoid places or events with large numbers of people in one location, like concerts or sporting events. . Carefully consider travel plans you have or are making. . If you are planning any travel outside or inside the [place], visit the CDC's Travelers' Health webpage for the latest health notices. . If you have some symptoms but not all symptoms, continue to monitor at home and seek medical attention if your  symptoms worsen. . If you are having a medical emergency, call 911.  HOME CARE . Only take medications as instructed by your medical team. . Drink plenty of fluids and get plenty of rest. . A steam or ultrasonic humidifier can help if you have congestion.   GET HELP RIGHT AWAY IF YOU HAVE EMERGENCY WARNING SIGNS** FOR COVID-19. If you or someone is showing any of these signs seek emergency medical care immediately. Call 911 or proceed to your closest emergency facility  if: . You develop worsening high fever. . Trouble breathing . Bluish lips or face . Persistent pain or pressure in the chest . New confusion . Inability to wake or stay awake . You cough up blood. . Your symptoms become more severe  **This list is not all possible symptoms. Contact your medical provider for any symptoms that are sever or concerning to you.   MAKE SURE YOU   Understand these instructions.  Will watch your condition.  Will get help right away if you are not doing well or get worse.  Your e-visit answers were reviewed by a board certified advanced clinical practitioner to complete your personal care plan.  Depending on the condition, your plan could have included both over the counter or prescription medications.  If there is a problem please reply once you have received a response from your provider.  Your safety is important to [place].  If you have drug allergies check your prescription carefully.    You can use MyChart to ask questions about today's visit, request a non-urgent call back, or ask for a work or school excuse for 24 hours related to this e-Visit. If it has been greater than 24 hours you will need to follow up with your provider, or enter a new e-Visit to address those concerns. You will get an e-mail in the next two days asking about your experience.  I hope that your e-visit has been valuable and will speed your recovery. Thank you for using e-visits.  I have spent at least 5 minutes reviewing and documenting in the patient's chart.

## 2019-02-02 LAB — NOVEL CORONAVIRUS, NAA: SARS-CoV-2, NAA: NOT DETECTED

## 2019-05-15 ENCOUNTER — Ambulatory Visit: Payer: Managed Care, Other (non HMO) | Attending: Internal Medicine

## 2019-05-15 DIAGNOSIS — Z23 Encounter for immunization: Secondary | ICD-10-CM | POA: Insufficient documentation

## 2019-05-15 NOTE — Progress Notes (Signed)
   Covid-19 Vaccination Clinic  Name:  TWAN SWIGGETT    MRN: [id number] DOB: 1975-11-06  05/15/2019  Mr. Feezor was observed post Covid-19 immunization for 15 minutes without incident. He was provided with Vaccine Information Sheet and instruction to access the V-Safe system.   Mr. Batson was instructed to call 911 with any severe reactions post vaccine: Marland Kitchen Difficulty breathing  . Swelling of face and throat  . A fast heartbeat  . A bad rash all over body  . Dizziness and weakness

## 2019-06-16 ENCOUNTER — Ambulatory Visit: Payer: Managed Care, Other (non HMO) | Attending: Internal Medicine

## 2019-06-16 DIAGNOSIS — Z23 Encounter for immunization: Secondary | ICD-10-CM

## 2019-06-16 NOTE — Progress Notes (Signed)
   Covid-19 Vaccination Clinic  Name:  Paul Duncan    MRN: [id number] DOB: 09/29/1975  06/16/2019  Mr. Misko was observed post Covid-19 immunization for 15 minutes without incident. He was provided with Vaccine Information Sheet and instruction to access the V-Safe system.   Mr. Mcosker was instructed to call 911 with any severe reactions post vaccine: Marland Kitchen Difficulty breathing  . Swelling of face and throat  . A fast heartbeat  . A bad rash all over body  . Dizziness and weakness   Immunizations Administered    Name Date Dose VIS Date Route   Pfizer COVID-19 Vaccine 06/16/2019  3:56 PM 0.3 mL 02/20/2019 Intramuscular   Manufacturer: [place]   Lot: [id number]   [place]: [id number]

## 2019-10-06 ENCOUNTER — Telehealth: Payer: Self-pay | Admitting: Cardiology

## 2019-10-06 MED ORDER — METOPROLOL TARTRATE 50 MG PO TABS: 25.0000 mg | ORAL_TABLET | Freq: Two times a day (BID) | ORAL | 3 refills | Status: DC

## 2019-10-06 NOTE — Telephone Encounter (Signed)
Pt c/o medication issue:  1. Name of Medication:metoprolol tartrate (LOPRESSOR) 50 MG tablet   2. How are you currently taking this medication (dosage and times per day)? As written  3. Are you having a reaction (difficulty breathing--STAT)? No  4. What is your medication issue? Pt is currently out of refills. Needs a new prescription sent to [facility], [place]

## 2019-10-06 NOTE — Telephone Encounter (Signed)
Refill sent as requested ./cy 

## 2019-12-01 IMAGING — CR DG CHEST 2V
2 series · 2 of 2 positions shown · non-contrast
Comparison: 04/25/2012

CLINICAL DATA: Mid chest pain and shortness of breath.

EXAM:
CHEST - 2 VIEW

[chest pa]
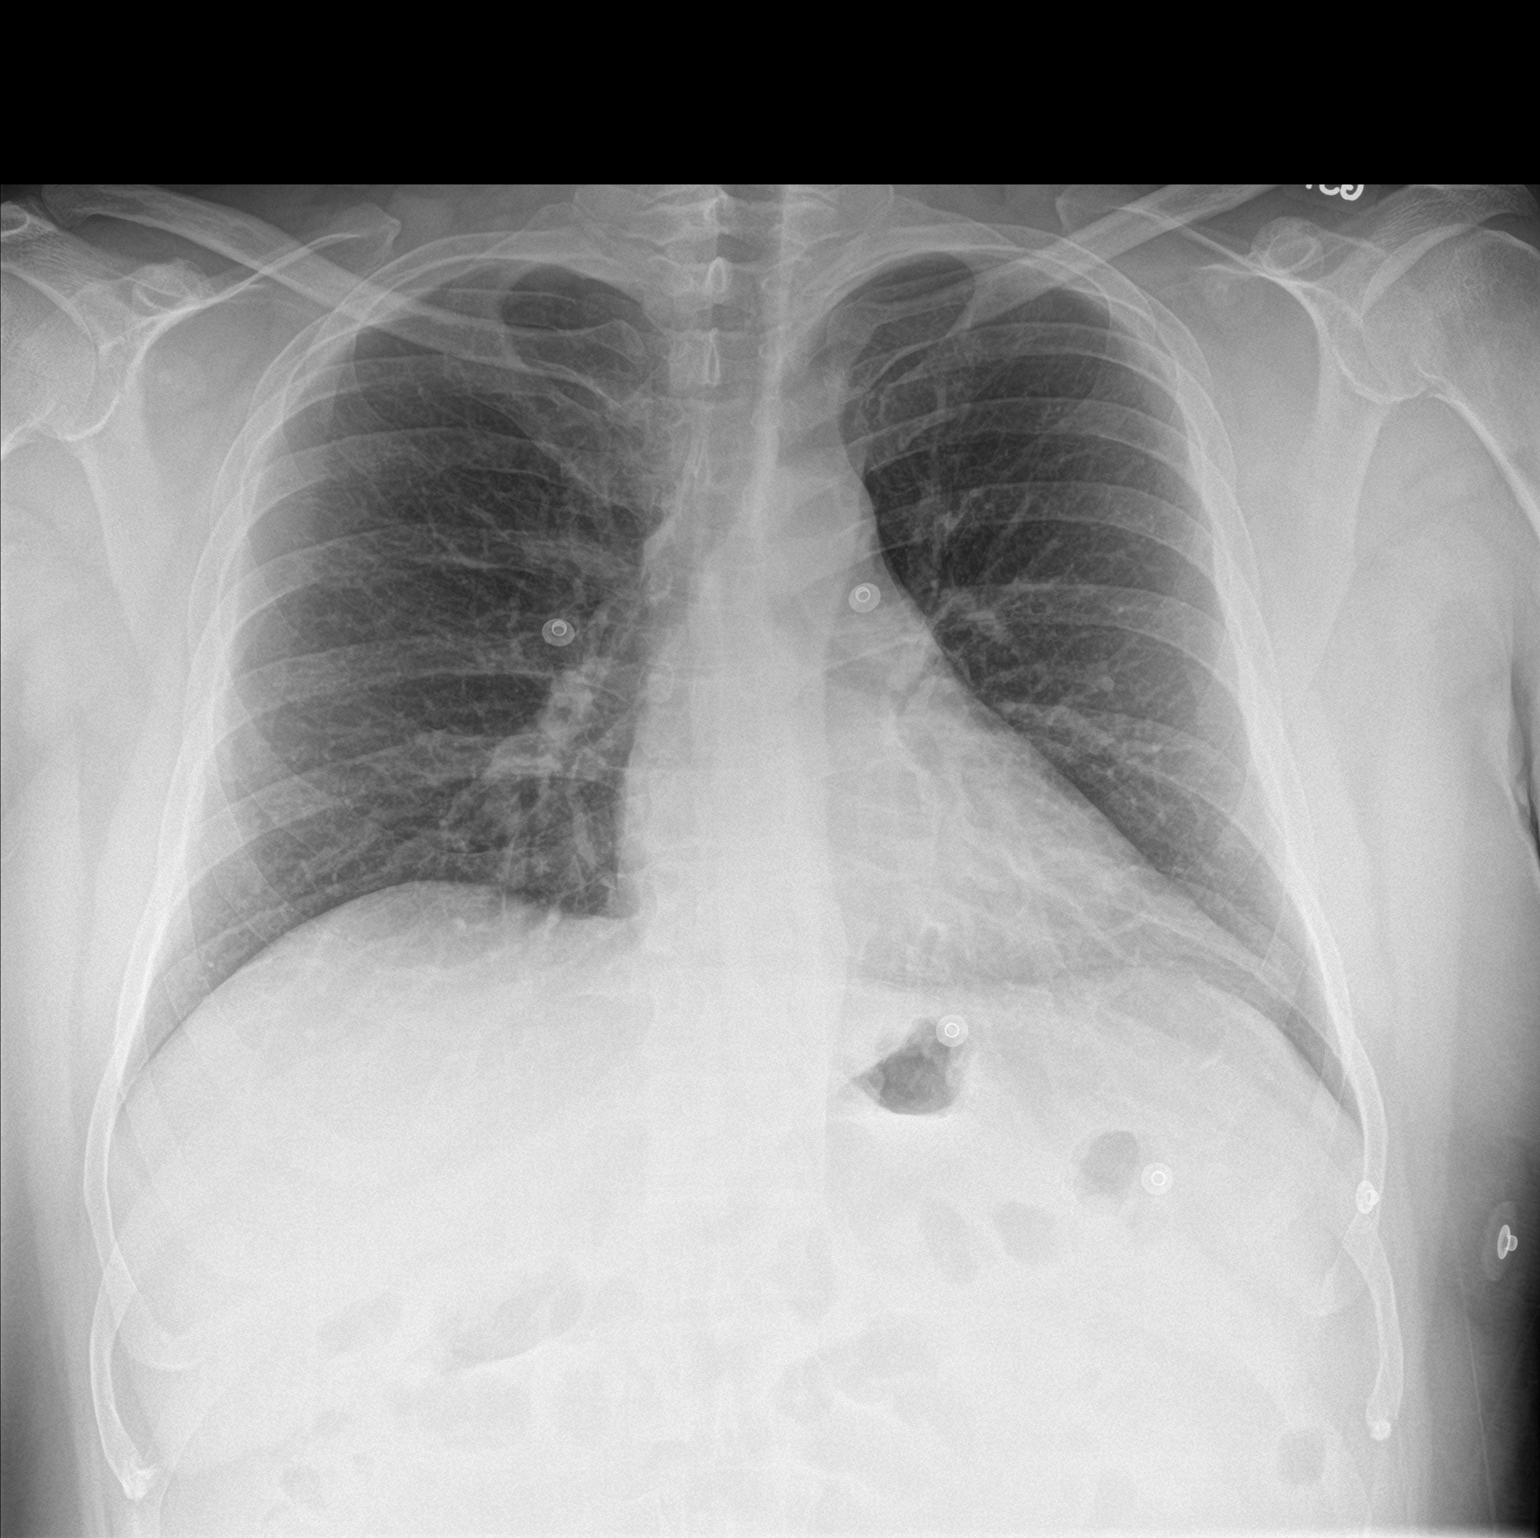

[chest lat]
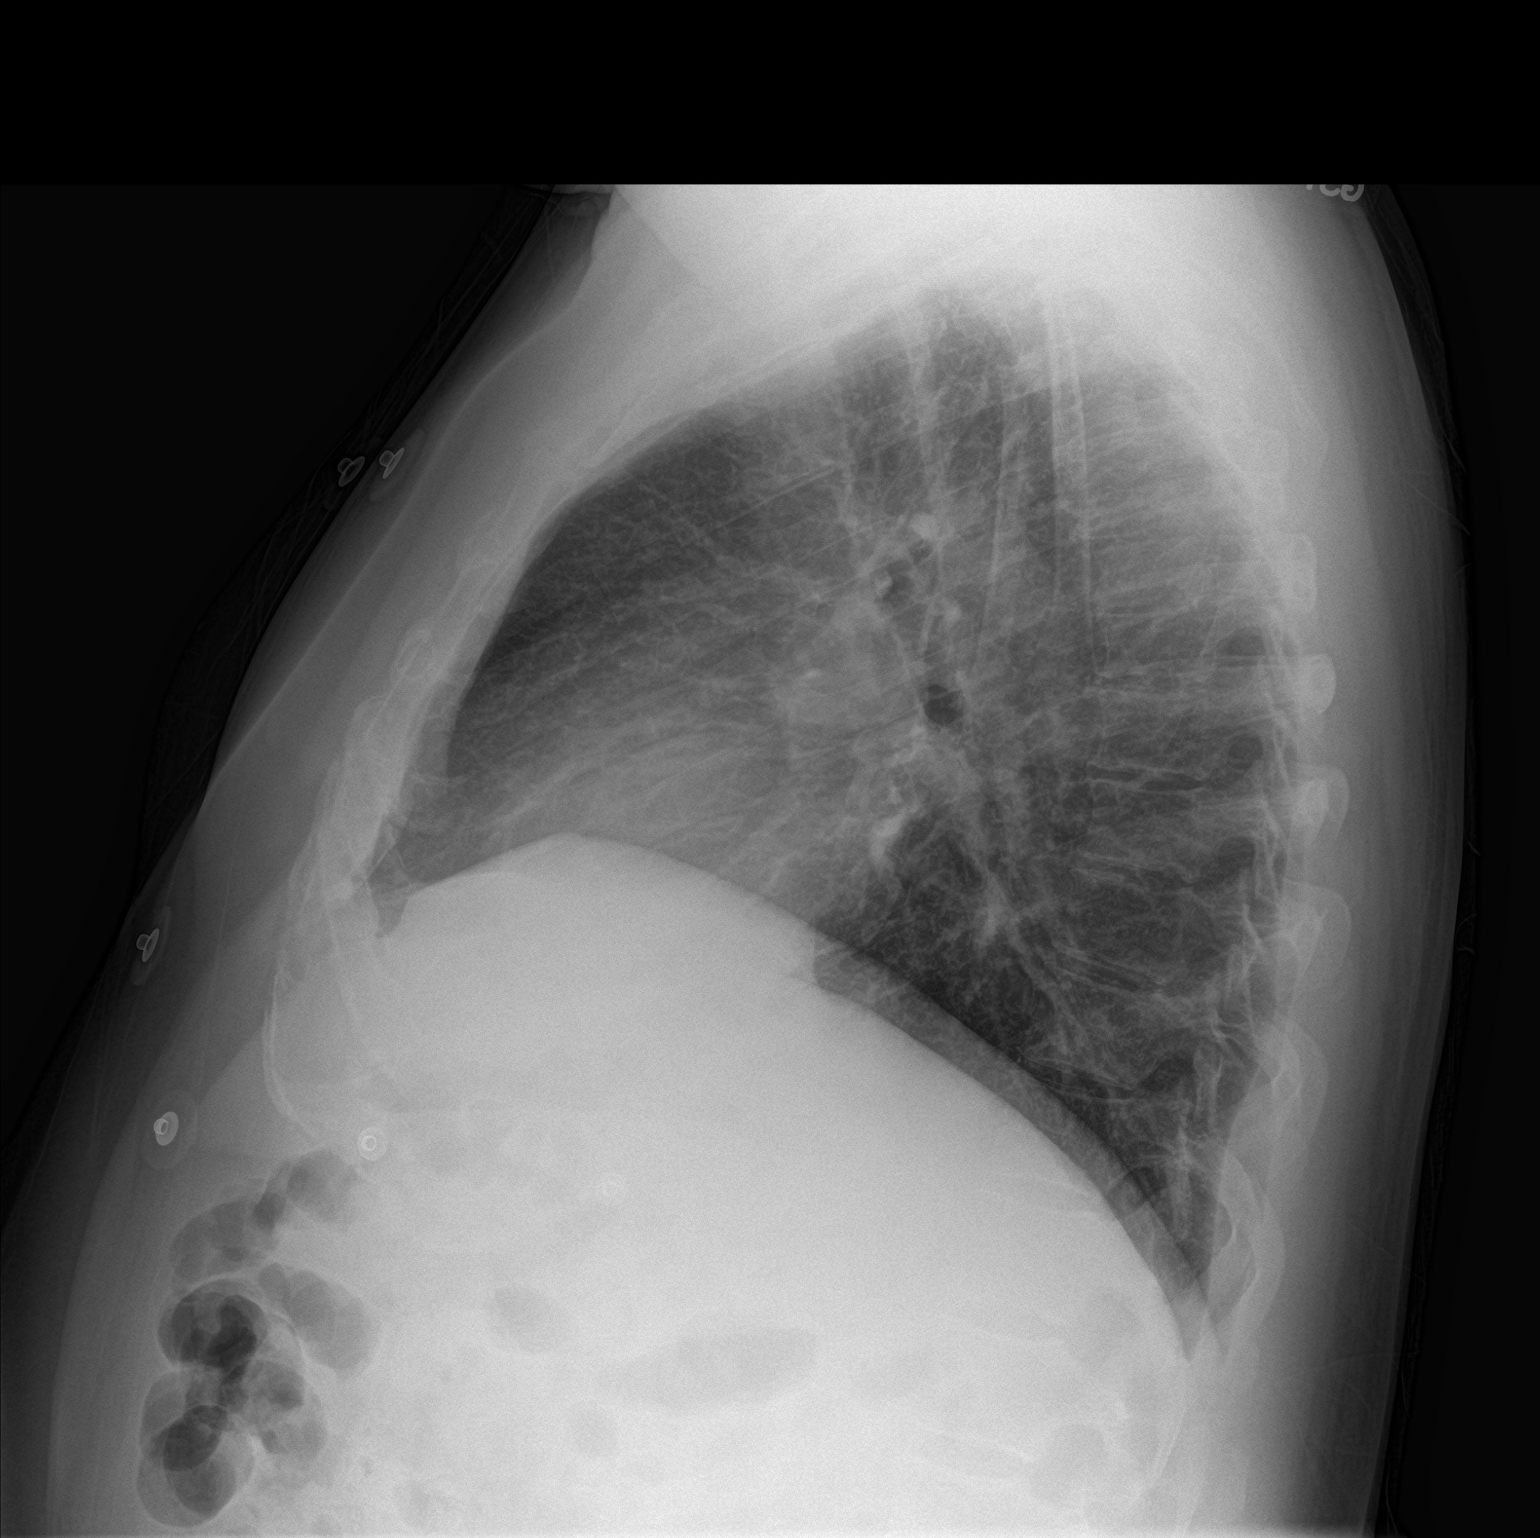

[2 of 2 positions shown; findings below may reference images not displayed]

FINDINGS: Mildly low lung volumes. The lungs appear clear. Cardiac and
mediastinal margins appear normal. No pleural effusion.
IMPRESSION: 1.  No active cardiopulmonary disease is radiographically apparent.

## 2019-12-07 NOTE — Progress Notes (Signed)
Cardiology Office Note    Date:  12/11/2019   ID:  Paul Duncan, DOB 1975-03-20, MRN [id number]  PCP:  [name], PA  Cardiologist:  Dr. Martinique  Chief Complaint  Patient presents with  . Coronary Artery Disease    History of Present Illness:  Paul Duncan is a 44 y.o. male with PMH of HTN, HLD and brain tumor s/p resection.  He was  admitted to the hospital on 12/18/2017 with a  NSTEMI.    Patient underwent cardiac catheterization on 12/18/2017 which showed EF 25 to 35%, 60% mid RCA lesion, 100% ostial OM 2 lesion, 30% ostial OM1 lesion, 100% mid LAD occlusion, 99% proximal LAD stenosis.  Bypass surgery was recommended if distal LAD territory is proven viable.  Echocardiogram obtained on the same day showed EF 40 to 45%, mild LVH, akinesis of the apical anterior and apical myocardium, akinesis of the mid anteroseptal myocardium.  MRI viability test shows there was substantial viability of the anterior wall on with only small area of scar at the apex.  Therefore the decision was made to proceed with bypass surgery.  Due to complication with sheath placement for the Deberah Pelton catheter was placed in the right common carotid artery, vascular surgery was called and the patient had to undergo exploration of the right neck, repair of the right internal jugular vein, and repair of the injury to the origin of the right vertebral artery on 12/23/2017.  Patient underwent CABG x5 with LIMA to LAD, SVG to diagonal, SVG to PDA and sequential SVG to OM1 and OM 2 by Dr. Servando Snare.   On follow up Echo in January 2020 there was significant improvement in LV function with EF 50-55%.  On follow up today he is doing well. Denies any chest pain, palpitations, dyspnea. He walks a lot at work which is his main activity. 8500 steps per day. He has gained 18 -20 lbs. Notes when he has checked BP it is 130/70.    Past Medical History:  Diagnosis Date  . Brain tumor ([place])   . Hypertension   . Seizures  ([facility])     Past Surgical History:  Procedure Laterality Date  . BRAIN SURGERY    . BRAIN SURGERY  [phone]  . CORONARY ARTERY BYPASS GRAFT N/A 12/23/2017   Procedure: CORONARY ARTERY BYPASS GRAFTING (CABG) x 5: Using Left Internal Mammary Artery (LIMA) and Endoscopically Harvested Right Thigh/Calf and Left Thigh Greater Saphenous Vein Grafts (SVG);   LIMA to LAD, SVG to Diag1, SVG to PDA, SVG to OM1 and OM2; / and Repair of Vertebral Laceration and Removal of [name] Introducer;  Surgeon: [name], MD;  Location: [facility];  Service: Open Hear  . LEFT HEART CATH AND CORONARY ANGIOGRAPHY N/A 12/18/2017   Procedure: LEFT HEART CATH AND CORONARY ANGIOGRAPHY;  Surgeon: [name], MD;  Location: [facility];  Service: Cardiovascular;  Laterality: N/A;  . TEE WITHOUT CARDIOVERSION N/A 12/23/2017   Procedure: TRANSESOPHAGEAL ECHOCARDIOGRAM (TEE);  Surgeon: [name], MD;  Location: [facility];  Service: Open Heart Surgery;  Laterality: N/A;    Current Medications: Outpatient Medications Prior to Visit  Medication Sig Dispense Refill  . aspirin EC 81 MG tablet Take 81 mg by mouth daily.    . metoprolol tartrate (LOPRESSOR) 50 MG tablet Take 0.5 tablets (25 mg total) by mouth 2 (two) times daily. 90 tablet 3  . atorvastatin (LIPITOR) 80 MG tablet Take 1 tablet (80  mg total) by mouth daily at 6 PM. 90 tablet 3   No facility-administered medications prior to visit.     Allergies:   Patient has no known allergies.   Social History   Socioeconomic History  . Marital status: Single    Spouse name: Not on file  . Number of children: Not on file  . Years of education: Not on file  . Highest education level: Not on file  Occupational History  . Not on file  Tobacco Use  . Smoking status: Never Smoker  . Smokeless tobacco: Never Used  Substance and Sexual Activity  . Alcohol use: No  . Drug use: No  . Sexual activity: Not on file  Other Topics Concern  . Not on file   Social History Narrative   ** Merged History Encounter **       Social Determinants of Health   Financial Resource Strain:   . Difficulty of Paying Living Expenses: Not on file  Food Insecurity:   . Worried About Charity fundraiser in the Last Year: Not on file  . Ran Out of Food in the Last Year: Not on file  Transportation Needs:   . Lack of Transportation (Medical): Not on file  . Lack of Transportation (Non-Medical): Not on file  Physical Activity:   . Days of Exercise per Week: Not on file  . Minutes of Exercise per Session: Not on file  Stress:   . Feeling of Stress : Not on file  Social Connections:   . Frequency of Communication with Friends and Family: Not on file  . Frequency of Social Gatherings with Friends and Family: Not on file  . Attends Religious Services: Not on file  . Active Member of Clubs or Organizations: Not on file  . Attends Archivist Meetings: Not on file  . Marital Status: Not on file     Family History:  The patient's family history includes Diabetes in his mother; Heart disease in his father.   ROS:   Please see the history of present illness.    ROS All other systems reviewed and are negative.   PHYSICAL EXAM:   VS:  BP 140/76   Pulse (!) 51   Ht 5\' 6"  (1.676 m)   Wt 222 lb 3.2 oz (100.8 kg)   SpO2 96%   BMI 35.86 kg/m    GEN: Well nourished, obese, in no acute distress  HEENT: normal  Neck: no JVD, carotid bruits, or masses Cardiac: RRR; no murmurs, rubs, or gallops,no edema. Sternotomy scar is healing well. Respiratory:  clear to auscultation bilaterally, normal work of breathing GI: soft, nontender, nondistended, + BS MS: no deformity or atrophy  Skin: warm and dry, no rash Neuro:  Alert and Oriented x 3, Strength and sensation are intact Psych: euthymic mood, full affect  Wt Readings from Last 3 Encounters:  12/11/19 222 lb 3.2 oz (100.8 kg)  12/10/18 204 lb (92.5 kg)  05/16/18 189 lb 9.5 oz (86 kg)       Studies/Labs Reviewed:   EKG:  EKG is ordered today.  NSR rate 51. LAD, nonspecific TWA. . I have personally reviewed and interpreted this study.   Recent Labs: No results found for requested labs within last 8760 hours.   Lipid Panel    Component Value Date/Time   CHOL 120 03/10/2018 0818   TRIG 120 03/10/2018 0818   HDL 31 (L) 03/10/2018 0818   CHOLHDL 3.9 03/10/2018 0818   CHOLHDL  6.1 12/18/2017 0839   VLDL 40 12/18/2017 0839   LDLCALC 65 03/10/2018 0818   Dated 10/16/18: cholesterol 123, triglycerides 96, HDL 36, LDL 68. A1c 5,5%, CMET and CBC normal  Additional studies/ records that were reviewed today include:   Cath 12/18/2017  There is moderate to severe left ventricular systolic dysfunction.  LV end diastolic pressure is moderately elevated.  The left ventricular ejection fraction is 25-35% by visual estimate.  Mid RCA lesion is 60% stenosed.  Ost 2nd Mrg lesion is 100% stenosed.  Ost 1st Mrg lesion is 30% stenosed.  Mid LAD lesion is 100% stenosed.  Prox LAD lesion is 99% stenosed.   1.  Significant three-vessel coronary artery disease as outlined above. 2.  Severely reduced LV systolic function with an EF of 25 to 35%. 3.  Moderately elevated left ventricular end-diastolic pressure.  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  If the mid to distal LAD myocardium is viable, I think his best option is CABG.  If in the other hand there is no viable myocardium in that area, PCI of proximal LAD plus fractional flow reserve guided revascularization of the RCA can be considered.   Echo 12/18/2017 LV EF: 40% -   45% Study Conclusions  - Left ventricle: The cavity size was normal. There was mild   concentric hypertrophy. Systolic function was mildly to   moderately reduced. The estimated ejection fraction was in the   range of 40% to 45%. There is akinesis of  the apicalanterior and   apical myocardium. There is akinesis of the midanteroseptal   myocardium. - Pulmonary arteries: Systolic pressure could not be accurately   estimated.    MR VIABILITY  FINDINGS: Limited images of the lung fields showed no gross abnormalities.  Normal left ventricular size with mild LV hypertrophy. Mid to apical anterior, anteroseptal and inferoseptal hypokinesis. Apical lateral and apical inferior hypokinesis. Severe hypokinesis of the true apex. LV EF 45%. Normal right ventricular size and systolic function, EF 67%. Normal right and left atrial sizes. Trileaflet aortic valve with no stenosis, mild aortic insufficiency. No significant mitral regurgitation noted.  Delayed enhancement imaging: Patchy <25% wall thickness subendocardial late gadolinium enhancement (LGE) in the mid to apical anterior, anteroseptal, and inferoseptal walls.  Measurements:  LVEDV 132 mL  LVSV 59 mL  LVEF 45%  RVEDV 86 mL  RVSV 51 mL  RVEF 59%  IMPRESSION: 1. Normal LV size with mild LV hypertrophy. EF 45% with wall motion abnormalities as noted above.  2. Normal RV size and systolic function, EF 34%.  3. Delayed enhancement imaging was suggestive of viability of the hypokinetic wall segments.   Vascular surgery 12/23/2017 1.  Exploration of the right neck 2.  Repair of right internal jugular vein 3.  Repair of injury to the origin of the right vertebral artery   CABG 12/23/2017 PROCEDURE:  Procedure(s): CORONARY ARTERY BYPASS GRAFTING (CABG) x 5: Using Left Internal Mammary Artery (LIMA) and Endoscopically Harvested Right Thigh/Calf and Left Thigh Greater Saphenous Vein Grafts (SVG);   LIMA to LAD, SVG to Diag1, SVG to PDA, SVG to OM1 and OM2; / and Repair of Vertebral artery injury by preop line placement  and Removal of Swan Ganz Introducer (N/A) TRANSESOPHAGEAL ECHOCARDIOGRAM (TEE) (N/A)  LIMA to LAD SVG to Diag1 SVG to PDA SVG to OM1 and  OM2  Echo 04/03/18: Study Conclusions  - Left  ventricle: The cavity size was normal. Systolic function was   normal. The estimated ejection fraction was in the range of 50%   to 55%. Hypokinesis of the apicalanteroseptal and apical   myocardium. Left ventricular diastolic function parameters were   normal. - Aortic valve: There was mild regurgitation. - Mitral valve: There was mild regurgitation. - Left atrium: The atrium was mildly dilated.  Impressions:  - Anteroapical wall motion abnormalities persist, but have improved   since the previous study.  ASSESSMENT:    1. Coronary artery disease involving coronary bypass graft of native heart without angina pectoris   2. S/P CABG x 5   3. Essential hypertension   4. Hyperlipidemia, unspecified hyperlipidemia type      PLAN:  In order of problems listed above:  1. CAD s/pNSTEMI: Found to have multivessel disease with severe disease in the LAD territory, MRI viability test showed the distal LAD territory was viable.  He underwent a CABG x5 October 2019.  He  is asymptomatic. EF has recovered.  Continue aspirin and statin. Long term need to focus on lifestyle modification. Needs to lose weight.  2. Hypertension: continue metoprolol   3    Hyperlipidemia: on Lipitor 80 mg daily. Will update labs today.   4.    Ischemic CM- pre op EF 40-45%., post revascularization EF improved to 50-55%.   5. Obesity. Discussed weight loss strategies.   Follow up in one year    Medication Adjustments/Labs and Tests Ordered: Current medicines are reviewed at length with the patient today.  Concerns regarding medicines are outlined above.  Medication changes, Labs and Tests ordered today are listed in the Patient Instructions below. There are no Patient Instructions on file for this visit.   Signed, Branston Halsted Martinique, MD  12/11/2019 8:14 AM    [facility] [place], [place], [place]  [address] Phone: [phone];  Fax: [phone]

## 2019-12-09 IMAGING — DX DG CHEST 1V PORT
1 series · 1 of 1 positions shown · non-contrast
Comparison: 12/25/2017

CLINICAL DATA: Chest tube

EXAM:
PORTABLE CHEST 1 VIEW

[chest]
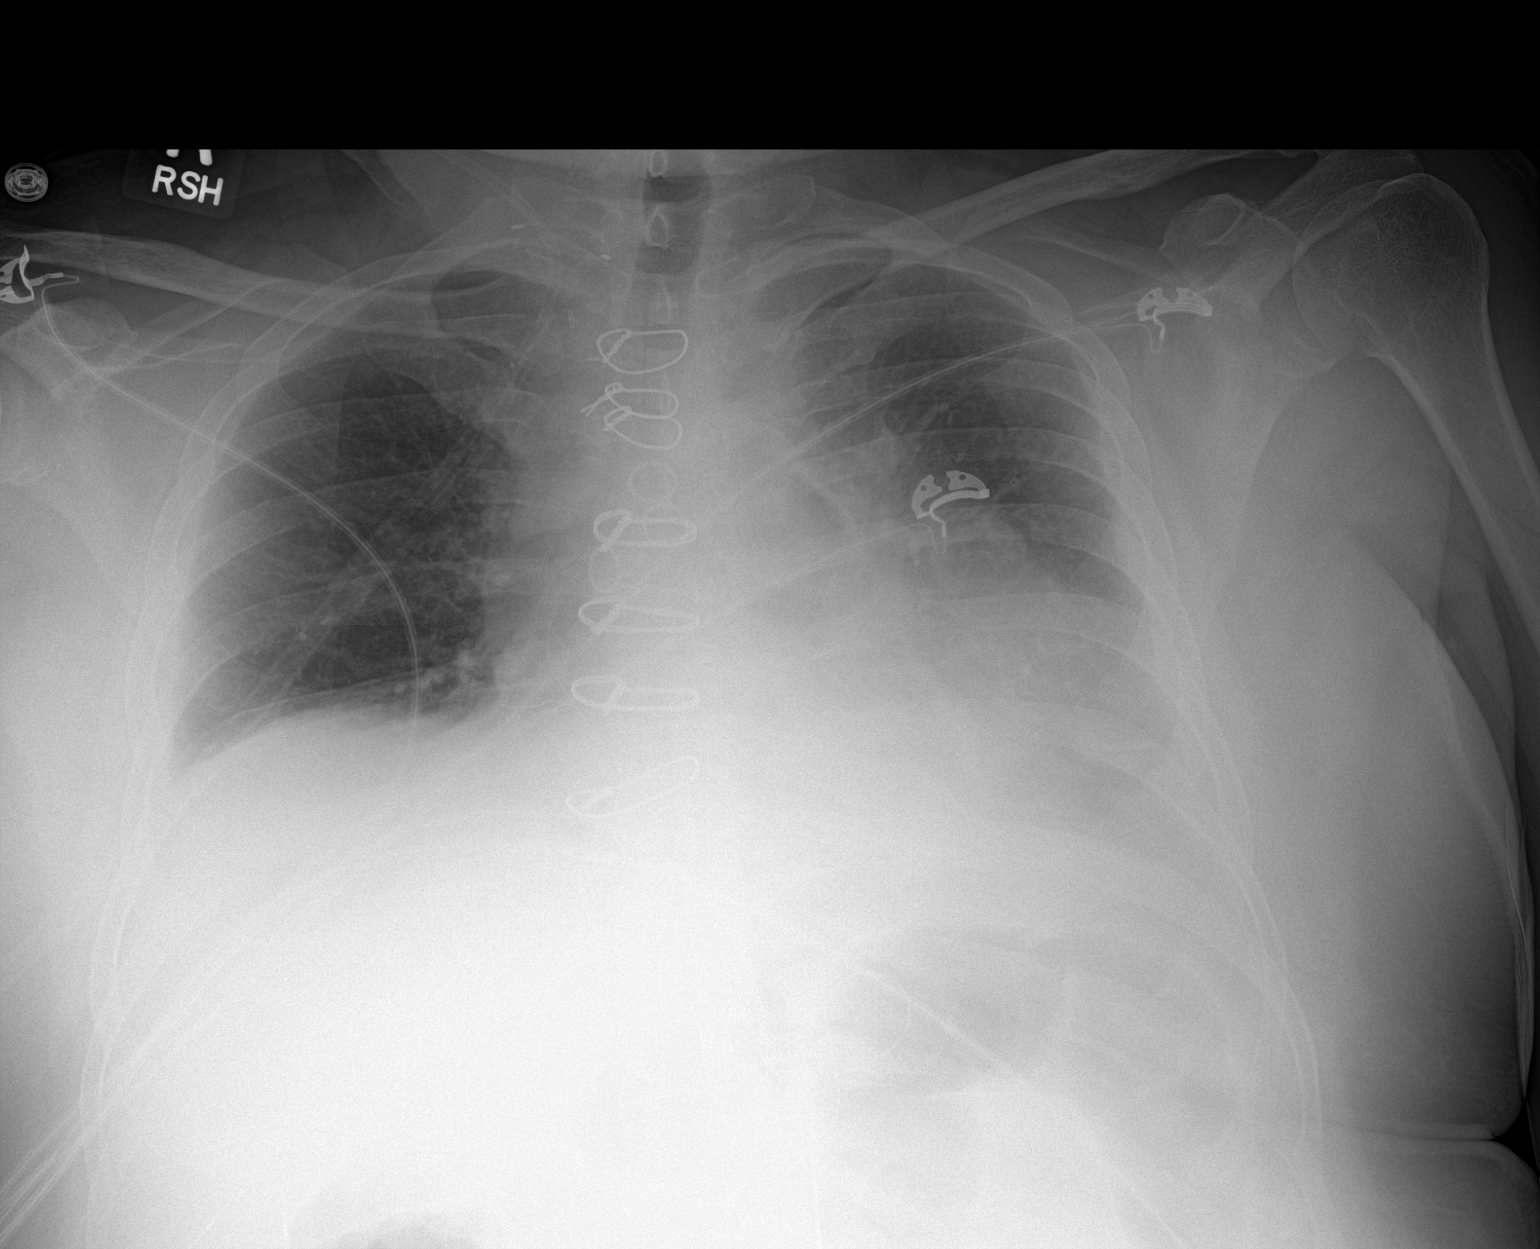

[1 of 1 positions shown; findings below may reference images not displayed]

FINDINGS: Left chest tube removed.  No pneumothorax

Decreased lung volume. Bibasilar atelectasis with mild progression
on the left. No edema. Small left effusion
IMPRESSION: Left chest tube removed.  No pneumothorax

Hypoventilation with progressive left lower lobe atelectasis.

## 2019-12-11 ENCOUNTER — Ambulatory Visit (INDEPENDENT_AMBULATORY_CARE_PROVIDER_SITE_OTHER): Payer: Managed Care, Other (non HMO) | Admitting: Cardiology

## 2019-12-11 ENCOUNTER — Other Ambulatory Visit: Payer: Self-pay

## 2019-12-11 ENCOUNTER — Encounter: Payer: Self-pay | Admitting: Cardiology

## 2019-12-11 VITALS — BP 140/76 | HR 51 | Ht 66.0 in | Wt 222.2 lb

## 2019-12-11 DIAGNOSIS — E785 Hyperlipidemia, unspecified: Secondary | ICD-10-CM

## 2019-12-11 DIAGNOSIS — I25810 Atherosclerosis of coronary artery bypass graft(s) without angina pectoris: Secondary | ICD-10-CM

## 2019-12-11 DIAGNOSIS — I10 Essential (primary) hypertension: Secondary | ICD-10-CM | POA: Diagnosis not present

## 2019-12-11 DIAGNOSIS — Z951 Presence of aortocoronary bypass graft: Secondary | ICD-10-CM

## 2019-12-11 MED ORDER — ATORVASTATIN CALCIUM 80 MG PO TABS: 80.0000 mg | ORAL_TABLET | Freq: Every day | ORAL | 3 refills | Status: DC

## 2019-12-12 LAB — BASIC METABOLIC PANEL
BUN/Creatinine Ratio: 16 (ref 9–20)
BUN: 16 mg/dL (ref 6–24)
CO2: 26 mmol/L (ref 20–29)
Calcium: 9.2 mg/dL (ref 8.7–10.2)
Chloride: 101 mmol/L (ref 96–106)
Creatinine, Ser: 0.98 mg/dL (ref 0.76–1.27)
GFR calc Af Amer: 107 mL/min/{1.73_m2} (ref >59)
GFR calc non Af Amer: 93 mL/min/{1.73_m2} (ref >59)
Glucose: 96 mg/dL (ref 65–99)
Potassium: 4.5 mmol/L (ref 3.5–5.2)
Sodium: 139 mmol/L (ref 134–144)

## 2019-12-12 LAB — HEPATIC FUNCTION PANEL
ALT: 45 IU/L — ABNORMAL HIGH (ref 0–44)
AST: 28 IU/L (ref 0–40)
Albumin: 4.6 g/dL (ref 4.0–5.0)
Alkaline Phosphatase: 103 IU/L (ref 44–121)
Bilirubin Total: 0.7 mg/dL (ref 0.0–1.2)
Bilirubin, Direct: 0.19 mg/dL (ref 0.00–0.40)
Total Protein: 6.8 g/dL (ref 6.0–8.5)

## 2019-12-12 LAB — LIPID PANEL
Chol/HDL Ratio: 3.8 ratio (ref 0.0–5.0)
Cholesterol, Total: 129 mg/dL (ref 100–199)
HDL: 34 mg/dL — ABNORMAL LOW (ref >39)
LDL Chol Calc (NIH): 73 mg/dL (ref 0–99)
Triglycerides: 122 mg/dL (ref 0–149)
VLDL Cholesterol Cal: 22 mg/dL (ref 5–40)

## 2019-12-12 LAB — HEMOGLOBIN A1C
Est. average glucose Bld gHb Est-mCnc: 108 mg/dL
Hgb A1c MFr Bld: 5.4 % (ref 4.8–5.6)

## 2019-12-13 NOTE — Progress Notes (Signed)
All values are normal or within acceptable limits.   Medication changes / Follow up labs / Other changes or recommendations:   None  Paul Duncan Martinique, MD 12/13/2019 9:25 AM

## 2020-09-23 ENCOUNTER — Telehealth: Payer: Self-pay | Admitting: Cardiology

## 2020-09-23 NOTE — Telephone Encounter (Signed)
Patient mentioned that he has covid and wants to know what he should do. Michela Pitcher he has stints put in around 2 years ago and wants to know if he should be taking anything

## 2020-09-23 NOTE — Telephone Encounter (Signed)
Called patient gave MD response.  Patient verbalized understanding.

## 2020-09-23 NOTE — Telephone Encounter (Signed)
Called patient, he states that he tested positive for COVID today. He is having symptoms of fever, sore throat, and a slight cough, he states he feels okay just extremely tired.  Due to his cardiac history he would like to have Dr.Jordan's recommendations on if he should do anything different or have any medications.  Advised I would route to MD to advise.  Will call back.  Thanks!

## 2020-09-23 NOTE — Telephone Encounter (Signed)
Nothing from a cardiac standpoint that he needs to do. Should check with primary care about starting Paxlovid.   Yahaira Bruski Martinique MD, [facility]

## 2020-10-11 ENCOUNTER — Other Ambulatory Visit: Payer: Self-pay | Admitting: Cardiology

## 2020-12-19 ENCOUNTER — Other Ambulatory Visit: Payer: Self-pay | Admitting: Cardiology

## 2021-01-20 ENCOUNTER — Other Ambulatory Visit: Payer: Self-pay | Admitting: Cardiology

## 2021-02-17 ENCOUNTER — Other Ambulatory Visit: Payer: Self-pay | Admitting: Cardiology

## 2021-05-01 NOTE — Progress Notes (Signed)
Cardiology Office Note    Date:  05/08/2021   ID:  Paul Duncan, DOB 1975-08-17, MRN [id number]  PCP:  [name], PA  Cardiologist:  Dr. Shalay Carder [place]  Chief Complaint  Patient presents with   Follow-up    12 months.    History of Present Illness:  Paul Duncan is a 46 y.o. male with PMH of HTN, HLD and brain tumor s/p resection.  He was  admitted to the hospital on 12/18/2017 with a  NSTEMI.    Patient underwent cardiac catheterization on 12/18/2017 which showed EF 25 to 35%, 60% mid RCA lesion, 100% ostial OM 2 lesion, 30% ostial OM1 lesion, 100% mid LAD occlusion, 99% proximal LAD stenosis.  Bypass surgery was recommended if distal LAD territory is proven viable.  Echocardiogram obtained on the same day showed EF 40 to 45%, mild LVH, akinesis of the apical anterior and apical myocardium, akinesis of the mid anteroseptal myocardium.  MRI viability test shows there was substantial viability of the anterior wall on with only small area of scar at the apex.  Therefore the decision was made to proceed with bypass surgery.  Due to complication with sheath placement for the Deberah Pelton catheter was placed in the right common carotid artery, vascular surgery was called and the patient had to undergo exploration of the right neck, repair of the right internal jugular vein, and repair of the injury to the origin of the right vertebral artery on 12/23/2017.  Patient underwent CABG x5 with LIMA to LAD, SVG to diagonal, SVG to PDA and sequential SVG to OM1 and OM 2 by Dr. Servando Snare.   On follow up Echo in January 2020 there was significant improvement in LV function with EF 50-55%.  On follow up today he is doing well from a cardiac standpoint. Denies any chest pain, palpitations, dyspnea. He walks a few days a week. Hasn't been able to lose weight. Was seen in the ED on 2/24 with food impaction in his esophagus. Had EGD.    Past Medical History:  Diagnosis Date   Brain tumor [facility])     Coronary artery disease    Hypertension    Seizures ([place])     Past Surgical History:  Procedure Laterality Date   BIOPSY  05/05/2021   Procedure: BIOPSY;  Surgeon: [name], MD;  Location: [facility] ENDOSCOPY;  Service: Gastroenterology;;   BRAIN SURGERY     BRAIN SURGERY  [phone]   CORONARY ARTERY BYPASS GRAFT N/A 12/23/2017   Procedure: CORONARY ARTERY BYPASS GRAFTING (CABG) x 5: Using Left Internal Mammary Artery (LIMA) and Endoscopically Harvested Right Thigh/Calf and Left Thigh Greater Saphenous Vein Grafts (SVG);   LIMA to LAD, SVG to Diag1, SVG to PDA, SVG to OM1 and OM2; / and Repair of Vertebral Laceration and Removal of [name] Introducer;  Surgeon: [name], MD;  Location: [place];  Service: Open [place]   ESOPHAGOGASTRODUODENOSCOPY N/A 05/05/2021   Procedure: ESOPHAGOGASTRODUODENOSCOPY (EGD);  Surgeon: [name], MD;  Location: [place];  Service: Gastroenterology;  Laterality: N/A;   FOREIGN BODY REMOVAL  05/05/2021   Procedure: FOREIGN BODY REMOVAL;  Surgeon: [name], MD;  Location: [place] ENDOSCOPY;  Service: Gastroenterology;;   LEFT HEART CATH AND CORONARY ANGIOGRAPHY N/A 12/18/2017   Procedure: LEFT HEART CATH AND CORONARY ANGIOGRAPHY;  Surgeon: [name], MD;  Location: [place] CV LAB;  Service: Cardiovascular;  Laterality: N/A;   TEE WITHOUT CARDIOVERSION N/A  12/23/2017   Procedure: TRANSESOPHAGEAL ECHOCARDIOGRAM (TEE);  Surgeon: [name], MD;  Location: [place];  Service: Open Heart Surgery;  Laterality: N/A;    Current Medications: Outpatient Medications Prior to Visit  Medication Sig Dispense Refill   aspirin EC 81 MG tablet Take 81 mg by mouth daily.     losartan (COZAAR) 50 MG tablet Take 1 tablet by mouth daily.     omeprazole (PRILOSEC) 20 MG capsule Take 20 mg by mouth 2 (two) times daily before a meal.     atorvastatin (LIPITOR) 80 MG tablet TAKE 1 TABLET (80 MG TOTAL) BY MOUTH DAILY AT 6 PM. PATIENT NEEDS  APPOINTMENT FOR FUTURE REFILLS. 1ST ATTEMPT. 30 tablet 6   metoprolol tartrate (LOPRESSOR) 50 MG tablet TAKE 1/2 TABLET BY MOUTH 2 TIMES A DAY 30 tablet 3   No facility-administered medications prior to visit.     Allergies:   Patient has no known allergies.   Social History   Socioeconomic History   Marital status: Single    Spouse name: Not on file   Number of children: Not on file   Years of education: Not on file   Highest education level: Not on file  Occupational History   Not on file  Tobacco Use   Smoking status: Never   Smokeless tobacco: Never  Substance and Sexual Activity   Alcohol use: No   Drug use: No   Sexual activity: Not on file  Other Topics Concern   Not on file  Social History Narrative   ** Merged History Encounter **       Social Determinants of Health   Financial Resource Strain: Not on file  Food Insecurity: Not on file  Transportation Needs: Not on file  Physical Activity: Not on file  Stress: Not on file  Social Connections: Not on file     Family History:  The patient's family history includes Diabetes in his mother; Heart disease in his father.   ROS:   Please see the history of present illness.    ROS All other systems reviewed and are negative.   PHYSICAL EXAM:   VS:  BP 126/80 (BP Location: Left Arm, Patient Position: Sitting, Cuff Size: Normal)    Pulse 62    Ht 5\' 6"  (1.676 m)    Wt 218 lb (98.9 kg)    BMI 35.19 kg/m    GEN: Well nourished, obese, in no acute distress  HEENT: normal  Neck: no JVD, carotid bruits, or masses Cardiac: RRR; no murmurs, rubs, or gallops,no edema. Sternotomy scar is healing well. Respiratory:  clear to auscultation bilaterally, normal work of breathing GI: soft, nontender, nondistended, + BS MS: no deformity or atrophy  Skin: warm and dry, no rash Neuro:  Alert and Oriented x 3, Strength and sensation are intact Psych: euthymic mood, full affect  Wt Readings from Last 3 Encounters:  05/08/21  218 lb (98.9 kg)  05/05/21 205 lb (93 kg)  12/11/19 222 lb 3.2 oz (100.8 kg)      Studies/Labs Reviewed:   EKG:  EKG is ordered today.  NSR rate 62. LAD, possible LVH. . I have personally reviewed and interpreted this study.   Recent Labs: No results found for requested labs within last 8760 hours.   Lipid Panel    Component Value Date/Time   CHOL 129 12/11/2019 0824   TRIG 122 12/11/2019 0824   HDL 34 (L) 12/11/2019 0824   CHOLHDL 3.8 12/11/2019 0824   CHOLHDL 6.1  12/18/2017 0839   VLDL 40 12/18/2017 0839   LDLCALC 73 12/11/2019 0824   Dated 10/16/18: cholesterol 123, triglycerides 96, HDL 36, LDL 68. A1c 5,5%, CMET and CBC normal Dated 01/04/21: cholesterol 138, triglycerides 119, HDL 35, LDL 82. A1c 5.4%. plts 548K otherwise CBC and CMET normal.  Additional studies/ records that were reviewed today include:   Cath 12/18/2017 There is moderate to severe left ventricular systolic dysfunction. LV end diastolic pressure is moderately elevated. The left ventricular ejection fraction is 25-35% by visual estimate. Mid RCA lesion is 60% stenosed. Ost 2nd Mrg lesion is 100% stenosed. Ost 1st Mrg lesion is 30% stenosed. Mid LAD lesion is 100% stenosed. Prox LAD lesion is 99% stenosed.   1.  Significant three-vessel coronary artery disease as outlined above. 2.  Severely reduced LV systolic function with an EF of 25 to 35%. 3.  Moderately elevated left ventricular end-diastolic pressure.   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  If the mid to distal LAD myocardium is viable, I think his best option is CABG.  If in the other hand there is no viable myocardium in that area, PCI of proximal LAD plus fractional flow reserve guided revascularization of the RCA can be considered.   Echo 12/18/2017 LV EF: 40% -   45% Study Conclusions   - Left ventricle: The cavity size was normal.  There was mild   concentric hypertrophy. Systolic function was mildly to   moderately reduced. The estimated ejection fraction was in the   range of 40% to 45%. There is akinesis of the apicalanterior and   apical myocardium. There is akinesis of the midanteroseptal   myocardium. - Pulmonary arteries: Systolic pressure could not be accurately   estimated.    MR VIABILITY  FINDINGS: Limited images of the lung fields showed no gross abnormalities.   Normal left ventricular size with mild LV hypertrophy. Mid to apical anterior, anteroseptal and inferoseptal hypokinesis. Apical lateral and apical inferior hypokinesis. Severe hypokinesis of the true apex. LV EF 45%. Normal right ventricular size and systolic function, EF 53%. Normal right and left atrial sizes. Trileaflet aortic valve with no stenosis, mild aortic insufficiency. No significant mitral regurgitation noted.   Delayed enhancement imaging: Patchy <25% wall thickness subendocardial late gadolinium enhancement (LGE) in the mid to apical anterior, anteroseptal, and inferoseptal walls.   Measurements:   LVEDV 132 mL   LVSV 59 mL   LVEF 45%   RVEDV 86 mL   RVSV 51 mL   RVEF 59%   IMPRESSION: 1. Normal LV size with mild LV hypertrophy. EF 45% with wall motion abnormalities as noted above.   2.  Normal RV size and systolic function, EF 97%.   3. Delayed enhancement imaging was suggestive of viability of the hypokinetic wall segments.   Vascular surgery 12/23/2017 1.  Exploration of the right neck 2.  Repair of right internal jugular vein 3.  Repair of injury to the origin of the right vertebral artery   CABG 12/23/2017 PROCEDURE:  Procedure(s): CORONARY ARTERY BYPASS GRAFTING (CABG) x 5: Using Left Internal Mammary Artery (LIMA) and Endoscopically Harvested Right Thigh/Calf and Left Thigh Greater Saphenous Vein Grafts (SVG);   LIMA to LAD, SVG to Diag1, SVG to PDA, SVG to OM1 and OM2; / and Repair of  Vertebral artery injury by preop line placement  and Removal of Swan Ganz Introducer (N/A) TRANSESOPHAGEAL ECHOCARDIOGRAM (  TEE) (N/A)  LIMA to LAD SVG to Diag1 SVG to PDA SVG to OM1 and OM2  Echo 04/03/18: Study Conclusions   - Left ventricle: The cavity size was normal. Systolic function was   normal. The estimated ejection fraction was in the range of 50%   to 55%. Hypokinesis of the apicalanteroseptal and apical   myocardium. Left ventricular diastolic function parameters were   normal. - Aortic valve: There was mild regurgitation. - Mitral valve: There was mild regurgitation. - Left atrium: The atrium was mildly dilated.   Impressions:   - Anteroapical wall motion abnormalities persist, but have improved   since the previous study.   ASSESSMENT:    1. Coronary artery disease involving coronary bypass graft of native heart without angina pectoris   2. S/P CABG x 5   3. Essential hypertension   4. Hyperlipidemia, unspecified hyperlipidemia type       PLAN:  In order of problems listed above:  CAD s/pNSTEMI 2019 with severe multivessel CAD.   He underwent a CABG x5 October 2019.  He  is asymptomatic. EF has recovered.  Continue aspirin and statin. Long term need to focus on lifestyle modification. Needs to lose weight.  Hypertension: continue metoprolol/ losartan  3    Hyperlipidemia: on Lipitor 80 mg daily. Last LDL increased to 82. Likely secondary to weight gain. On maximal lipitor dose. Will focus on improved diet, weight loss and aerobic activity. Repeat fasting lab in 3 months. If LDL not at goal will need to add additional therapy.   4.    Ischemic CM- pre op EF 40-45%., post revascularization EF improved to 50-55%.   5.    Obesity. Discussed weight loss strategies.   Follow up in one year    Medication Adjustments/Labs and Tests Ordered: Current medicines are reviewed at length with the patient today.  Concerns regarding medicines are outlined above.   Medication changes, Labs and Tests ordered today are listed in the Patient Instructions below. There are no Patient Instructions on file for this visit.   Signed, Annis Lagoy [place], MD  05/08/2021 8:08 AM    [facility] [place], [place], [place]  [address] Phone: [phone]; Fax: [phone]

## 2021-05-05 ENCOUNTER — Emergency Department (HOSPITAL_COMMUNITY): Payer: 59

## 2021-05-05 ENCOUNTER — Other Ambulatory Visit: Payer: Self-pay

## 2021-05-05 ENCOUNTER — Emergency Department (HOSPITAL_BASED_OUTPATIENT_CLINIC_OR_DEPARTMENT_OTHER): Payer: 59 | Admitting: Anesthesiology

## 2021-05-05 ENCOUNTER — Emergency Department (HOSPITAL_COMMUNITY): Payer: 59 | Admitting: Anesthesiology

## 2021-05-05 ENCOUNTER — Encounter (HOSPITAL_COMMUNITY): Payer: Self-pay | Admitting: Emergency Medicine

## 2021-05-05 ENCOUNTER — Ambulatory Visit (HOSPITAL_COMMUNITY)
Admission: EM | Admit: 2021-05-05 | Discharge: 2021-05-05 | Disposition: A | Discharge status: Home / Self Care | Payer: 59 | Attending: Emergency Medicine | Admitting: Emergency Medicine

## 2021-05-05 ENCOUNTER — Encounter (HOSPITAL_COMMUNITY)
Admission: EM | Disposition: A | Discharge status: Home / Self Care | Payer: Self-pay | Source: Home / Self Care | Attending: Emergency Medicine

## 2021-05-05 DIAGNOSIS — R1319 Other dysphagia: Secondary | ICD-10-CM

## 2021-05-05 DIAGNOSIS — Z20822 Contact with and (suspected) exposure to covid-19: Secondary | ICD-10-CM | POA: Insufficient documentation

## 2021-05-05 DIAGNOSIS — I2510 Atherosclerotic heart disease of native coronary artery without angina pectoris: Secondary | ICD-10-CM | POA: Diagnosis not present

## 2021-05-05 DIAGNOSIS — X58XXXA Exposure to other specified factors, initial encounter: Secondary | ICD-10-CM | POA: Diagnosis not present

## 2021-05-05 DIAGNOSIS — I10 Essential (primary) hypertension: Secondary | ICD-10-CM | POA: Diagnosis not present

## 2021-05-05 DIAGNOSIS — K2211 Ulcer of esophagus with bleeding: Secondary | ICD-10-CM | POA: Diagnosis not present

## 2021-05-05 DIAGNOSIS — E669 Obesity, unspecified: Secondary | ICD-10-CM | POA: Insufficient documentation

## 2021-05-05 DIAGNOSIS — R7303 Prediabetes: Secondary | ICD-10-CM | POA: Insufficient documentation

## 2021-05-05 DIAGNOSIS — Z6833 Body mass index (BMI) 33.0-33.9, adult: Secondary | ICD-10-CM | POA: Insufficient documentation

## 2021-05-05 DIAGNOSIS — Z951 Presence of aortocoronary bypass graft: Secondary | ICD-10-CM | POA: Diagnosis not present

## 2021-05-05 DIAGNOSIS — T18128A Food in esophagus causing other injury, initial encounter: Secondary | ICD-10-CM

## 2021-05-05 DIAGNOSIS — K2950 Unspecified chronic gastritis without bleeding: Secondary | ICD-10-CM | POA: Diagnosis not present

## 2021-05-05 DIAGNOSIS — W44F3XA Food entering into or through a natural orifice, initial encounter: Secondary | ICD-10-CM

## 2021-05-05 DIAGNOSIS — K264 Chronic or unspecified duodenal ulcer with hemorrhage: Secondary | ICD-10-CM | POA: Insufficient documentation

## 2021-05-05 DIAGNOSIS — K2971 Gastritis, unspecified, with bleeding: Secondary | ICD-10-CM

## 2021-05-05 DIAGNOSIS — K2951 Unspecified chronic gastritis with bleeding: Secondary | ICD-10-CM

## 2021-05-05 DIAGNOSIS — I252 Old myocardial infarction: Secondary | ICD-10-CM | POA: Diagnosis not present

## 2021-05-05 HISTORY — DX: Atherosclerotic heart disease of native coronary artery without angina pectoris: I25.10

## 2021-05-05 HISTORY — PX: FOREIGN BODY REMOVAL: SHX962

## 2021-05-05 HISTORY — PX: ESOPHAGOGASTRODUODENOSCOPY: SHX5428

## 2021-05-05 HISTORY — PX: BIOPSY: SHX5522

## 2021-05-05 LAB — RESP PANEL BY RT-PCR (FLU A&B, COVID) ARPGX2
Influenza A by PCR: NEGATIVE
Influenza B by PCR: NEGATIVE
SARS Coronavirus 2 by RT PCR: NEGATIVE

## 2021-05-05 LAB — CBG MONITORING, ED: Glucose-Capillary: 120 mg/dL — ABNORMAL HIGH (ref 70–99)

## 2021-05-05 SURGERY — EGD (ESOPHAGOGASTRODUODENOSCOPY)
Anesthesia: General

## 2021-05-05 MED ORDER — ROCURONIUM BROMIDE 10 MG/ML (PF) SYRINGE
PREFILLED_SYRINGE | INTRAVENOUS | Status: DC | PRN
  Administered 2021-05-05: 20 mg via INTRAVENOUS

## 2021-05-05 MED ORDER — ONDANSETRON HCL 4 MG/2ML IJ SOLN
INTRAMUSCULAR | Status: DC | PRN
  Administered 2021-05-05: 4 mg via INTRAVENOUS

## 2021-05-05 MED ORDER — DEXAMETHASONE SODIUM PHOSPHATE 10 MG/ML IJ SOLN
INTRAMUSCULAR | Status: DC | PRN
  Administered 2021-05-05: 10 mg via INTRAVENOUS

## 2021-05-05 MED ORDER — ONDANSETRON HCL 4 MG/2ML IJ SOLN
INTRAMUSCULAR | Status: AC
  Filled 2021-05-05: qty 4

## 2021-05-05 MED ORDER — PHENYLEPHRINE 40 MCG/ML (10ML) SYRINGE FOR IV PUSH (FOR BLOOD PRESSURE SUPPORT)
PREFILLED_SYRINGE | INTRAVENOUS | Status: DC | PRN
  Administered 2021-05-05: 80 ug via INTRAVENOUS
  Administered 2021-05-05: 120 ug via INTRAVENOUS
  Administered 2021-05-05 (×2): 80 ug via INTRAVENOUS

## 2021-05-05 MED ORDER — OXYCODONE HCL 5 MG PO TABS: 5.0000 mg | ORAL_TABLET | Freq: Once | ORAL | Status: DC | PRN

## 2021-05-05 MED ORDER — SUCCINYLCHOLINE CHLORIDE 200 MG/10ML IV SOSY
PREFILLED_SYRINGE | INTRAVENOUS | Status: DC | PRN
  Administered 2021-05-05: 130 mg via INTRAVENOUS

## 2021-05-05 MED ORDER — LIDOCAINE 2% (20 MG/ML) 5 ML SYRINGE
INTRAMUSCULAR | Status: AC
  Filled 2021-05-05: qty 5

## 2021-05-05 MED ORDER — PROPOFOL 10 MG/ML IV BOLUS
INTRAVENOUS | Status: DC | PRN
  Administered 2021-05-05: 200 mg via INTRAVENOUS

## 2021-05-05 MED ORDER — ONDANSETRON HCL 4 MG/2ML IJ SOLN: 4.0000 mg | Freq: Once | INTRAMUSCULAR | Status: DC | PRN

## 2021-05-05 MED ORDER — LIDOCAINE 2% (20 MG/ML) 5 ML SYRINGE
INTRAMUSCULAR | Status: DC | PRN
  Administered 2021-05-05: 60 mg via INTRAVENOUS

## 2021-05-05 MED ORDER — SODIUM CHLORIDE 0.9 % IV SOLN: INTRAVENOUS | Status: DC

## 2021-05-05 MED ORDER — LACTATED RINGERS IV SOLN: INTRAVENOUS | Status: DC | PRN

## 2021-05-05 MED ORDER — STERILE WATER FOR INJECTION IJ SOLN
INTRAMUSCULAR | Status: AC
  Filled 2021-05-05: qty 10

## 2021-05-05 MED ORDER — ONDANSETRON HCL 4 MG/2ML IJ SOLN
4.0000 mg | Freq: Once | INTRAMUSCULAR | Status: AC
  Administered 2021-05-05: 4 mg via INTRAVENOUS
  Filled 2021-05-05: qty 2

## 2021-05-05 MED ORDER — GLUCAGON HCL RDNA (DIAGNOSTIC) 1 MG IJ SOLR
1.0000 mg | Freq: Once | INTRAMUSCULAR | Status: AC
  Administered 2021-05-05: 1 mg via INTRAMUSCULAR

## 2021-05-05 MED ORDER — ROCURONIUM BROMIDE 10 MG/ML (PF) SYRINGE
PREFILLED_SYRINGE | INTRAVENOUS | Status: AC
  Filled 2021-05-05: qty 20

## 2021-05-05 MED ORDER — MIDAZOLAM HCL 2 MG/2ML IJ SOLN
INTRAMUSCULAR | Status: AC
  Filled 2021-05-05: qty 2

## 2021-05-05 MED ORDER — DEXAMETHASONE SODIUM PHOSPHATE 10 MG/ML IJ SOLN
INTRAMUSCULAR | Status: AC
  Filled 2021-05-05: qty 2

## 2021-05-05 MED ORDER — MIDAZOLAM HCL 2 MG/2ML IJ SOLN
INTRAMUSCULAR | Status: DC | PRN
Start: 2021-05-05 — End: 2021-05-05
  Administered 2021-05-05: 2 mg via INTRAVENOUS

## 2021-05-05 MED ORDER — SUGAMMADEX SODIUM 200 MG/2ML IV SOLN
INTRAVENOUS | Status: DC | PRN
Start: 2021-05-05 — End: 2021-05-05
  Administered 2021-05-05: 200 mg via INTRAVENOUS

## 2021-05-05 MED ORDER — FENTANYL CITRATE (PF) 250 MCG/5ML IJ SOLN
INTRAMUSCULAR | Status: DC | PRN
  Administered 2021-05-05: 50 ug via INTRAVENOUS

## 2021-05-05 MED ORDER — FENTANYL CITRATE (PF) 100 MCG/2ML IJ SOLN: 25.0000 ug | INTRAMUSCULAR | Status: DC | PRN

## 2021-05-05 MED ORDER — OXYCODONE HCL 5 MG/5ML PO SOLN: 5.0000 mg | Freq: Once | ORAL | Status: DC | PRN

## 2021-05-05 MED ORDER — GLUCAGON HCL RDNA (DIAGNOSTIC) 1 MG IJ SOLR
1.0000 mg | Freq: Once | INTRAMUSCULAR | Status: DC
  Filled 2021-05-05: qty 1

## 2021-05-05 MED ORDER — FENTANYL CITRATE (PF) 250 MCG/5ML IJ SOLN
INTRAMUSCULAR | Status: AC
  Filled 2021-05-05: qty 5

## 2021-05-05 MED ORDER — LACTATED RINGERS IV SOLN
INTRAVENOUS | Status: AC | PRN
Start: 2021-05-05 — End: 2021-05-05
  Administered 2021-05-05: 20 mL/h via INTRAVENOUS

## 2021-05-05 MED ORDER — GLUCAGON HCL RDNA (DIAGNOSTIC) 1 MG IJ SOLR: 1.0000 mg | Freq: Once | INTRAMUSCULAR | Status: DC

## 2021-05-05 NOTE — Op Note (Signed)
[facility] Patient Name: Paul Duncan Procedure Date : 05/05/2021 MRN: [id number] Attending MD: Estill Cotta. Loletha Carrow , MD Date of Birth: Dec 19, 1975 CSN: [id number] Age: 46 Admit Type: Outpatient Procedure:                Upper GI endoscopy Indications:              Foreign body in the esophagus Providers:                Mallie Mussel L. Loletha Carrow, MD, Dulcy Fanny, Luan Moore, Technician, Anette Guarneri, CRNA, Referring MD:             Zacarias Pontes ED physician Medicines:                Monitored Anesthesia Care Complications:            No immediate complications. Estimated Blood Loss:     Estimated blood loss was minimal. Procedure:                Pre-Anesthesia Assessment:                           - Prior to the procedure, a History and Physical                            was performed, and patient medications and                            allergies were reviewed. The patient's tolerance of                            previous anesthesia was also reviewed. The risks                            and benefits of the procedure and the sedation                            options and risks were discussed with the patient.                            All questions were answered, and informed consent                            was obtained. Prior Anticoagulants: The patient has                            taken no previous anticoagulant or antiplatelet                            agents except for aspirin. ASA Grade Assessment:                            III - A patient with severe systemic disease. After  reviewing the risks and benefits, the patient was                            deemed in satisfactory condition to undergo the                            procedure.                           After obtaining informed consent, the endoscope was                            passed under direct vision. Throughout the                             procedure, the patient's blood pressure, pulse, and                            oxygen saturations were monitored continuously. The                            [id number]-[id number] ([id number]) Olympus endoscope was introduced                            through the mouth, and advanced to the second part                            of duodenum. The upper GI endoscopy was                            accomplished without difficulty. The patient                            tolerated the procedure well. Scope In: Scope Out: Findings:      Food was found in the upper third of the esophagus.(large piece of meat       lodged and completely obstructing the lumen) Removal of food was       accomplished with a 4-prong grasper.      Few superficial esophageal ulcers were found at the gastroesophageal       junction. Entire esophageal mucosa generally edematous with some linear       furrowing in the distal and proximal esophagus. Biopsies were taken with       a cold forceps for histology (distal and proximal esophagus, separate       jars).      Hematin (altered blood/coffee-ground-like material) was found in the       gastric antrum. Several biopsies were obtained on the greater curvature       of the gastric body, on the lesser curvature of the gastric body, on the       greater curvature of the gastric antrum and on the lesser curvature of       the gastric antrum with cold forceps for histology. (one jar)      The exam of the stomach was otherwise normal.      Multiple diminutive erosions were found in the duodenal bulb.  The exam of the duodenum was otherwise normal. Impression:               - Food in the upper third of the esophagus. Removal                            was successful.                           - Esophageal ulcers. Biopsied.                           - Hematin (altered blood/coffee-ground-like                            material) in the gastric antrum.                           - Duodenal  erosions.                           - Several biopsies were obtained on the greater                            curvature of the gastric body, on the lesser                            curvature of the gastric body, on the greater                            curvature of the gastric antrum and on the lesser                            curvature of the gastric antrum. Recommendation:           - Discharge patient to home.                           - Soft diet for 3 days. Cut and chew food well,                            especially meat. No meat for one week.                           - omeprazole 20 mg twice daily (available over the                            counter) continue all other meds, including aspirin                           Follow up biopsies - a visit at my office will be                            arranged. Procedure Code(s):        --- Professional ---  54627, Esophagogastroduodenoscopy, flexible,                            transoral; with removal of foreign body(s)                           43239, Esophagogastroduodenoscopy, flexible,                            transoral; with biopsy, single or multiple Diagnosis Code(s):        --- Professional ---                           O35.009F, Food in esophagus causing other injury,                            initial encounter                           K22.10, Ulcer of esophagus without bleeding                           K92.2, Gastrointestinal hemorrhage, unspecified                           K26.9, Duodenal ulcer, unspecified as acute or                            chronic, without hemorrhage or perforation                           T18.108A, Unspecified foreign body in esophagus                            causing other injury, initial encounter CPT copyright 2019 American Medical Association. All rights reserved. The codes documented in this report are preliminary and upon coder review may  be revised to  meet current compliance requirements. Adelis Docter L. Loletha Carrow, MD 05/05/2021 10:11:39 PM This report has been signed electronically. Number of Addenda: 0

## 2021-05-05 NOTE — ED Notes (Signed)
Pt has no obvious swelling, deformity or obstruction. Pt is able to talk without difficulty, is adequately oxygenated, but does have several hard dry coughs

## 2021-05-05 NOTE — ED Notes (Signed)
Pt transported upstairs by Endo staff

## 2021-05-05 NOTE — Transfer of Care (Signed)
Immediate Anesthesia Transfer of Care Note  Patient: Paul Duncan  Procedure(s) Performed: ESOPHAGOGASTRODUODENOSCOPY (EGD) FOREIGN BODY REMOVAL BIOPSY  Patient Location: PACU  Anesthesia Type:General  Level of Consciousness: awake and alert   Airway & Oxygen Therapy: Patient Spontanous Breathing and Patient connected to face mask oxygen  Post-op Assessment: Report given to RN and Post -op Vital signs reviewed and stable  Post vital signs: Reviewed and stable  Last Vitals:  Vitals Value Taken Time  BP 131/76 05/05/21 2216  Temp 36.7 C 05/05/21 2215  Pulse 76 05/05/21 2218  Resp 22 05/05/21 2218  SpO2 100 % 05/05/21 2218  Vitals shown include unvalidated device data.  Last Pain:  Vitals:   05/05/21 2047  TempSrc: Temporal  PainSc: 3       Patients Stated Pain Goal: 0 (11/13/99 4996)  Complications: No notable events documented.

## 2021-05-05 NOTE — Progress Notes (Signed)
Dr. Loletha Carrow at bedside.  Discussed procedural findings and reviewed discharge instructions with patient.  Copy of procedural report and discharge instructions given to patient.

## 2021-05-05 NOTE — ED Provider Triage Note (Signed)
Emergency Medicine Provider Triage Evaluation Note  SHIVAAY STORMONT , a 46 y.o. male  was evaluated in triage.  Pt complains of globus sensation.  Patient reports that he had chicken for lunch and after this he felt like something was stuck in his throat.  Has remained constant since onset.  Patient reports that he had difficulty swallowing water.    Review of Systems  Positive: Globus sensation, trouble swallowing Negative: Trouble breathing, drooling, neck stiffness  Physical Exam  BP 134/73    Pulse 82    Temp 98.5 F (36.9 C) (Oral)    Resp 16    SpO2 98%  Gen:   Awake, no distress   Resp:  Normal effort, fair to auscultation with no stridor MSK:   Moves extremities without difficulty  Other:  Patient handles oral secretions without difficulty.  Speaks in focally sentences without difficulty.  Medical Decision Making  Medically screening exam initiated at 1:48 PM.  Appropriate orders placed.  GANESH DEEG was informed that the remainder of the evaluation will be completed by another provider, this initial triage assessment does not replace that evaluation, and the importance of remaining in the ED until their evaluation is complete.  We will obtain x-ray imaging of neck.  Will administer glucagon at this time.   Loni Beckwith, [place] 05/05/21 1350

## 2021-05-05 NOTE — Anesthesia Procedure Notes (Signed)
Procedure Name: Intubation Date/Time: 05/05/2021 9:31 PM Performed by: Reece Agar, CRNA Pre-anesthesia Checklist: Patient identified, Emergency Drugs available, Suction available and Patient being monitored Patient Re-evaluated:Patient Re-evaluated prior to induction Oxygen Delivery Method: Circle System Utilized Preoxygenation: Pre-oxygenation with 100% oxygen Induction Type: IV induction, Rapid sequence and Cricoid Pressure applied Ventilation: Unable to mask ventilate Laryngoscope Size: Mac and 4 Grade View: Grade I Tube type: Oral Tube size: 7.5 mm Number of attempts: 1 Airway Equipment and Method: Stylet Placement Confirmation: ETT inserted through vocal cords under direct vision, positive ETCO2 and breath sounds checked- equal and bilateral Secured at: 23 cm Tube secured with: Tape Dental Injury: Teeth and Oropharynx as per pre-operative assessment  Comments: Food impaction - RSI performed

## 2021-05-05 NOTE — Anesthesia Postprocedure Evaluation (Signed)
Anesthesia Post Note  Patient: Paul Duncan  Procedure(s) Performed: ESOPHAGOGASTRODUODENOSCOPY (EGD) FOREIGN BODY REMOVAL BIOPSY     Patient location during evaluation: PACU Anesthesia Type: General Level of consciousness: awake and alert Pain management: pain level controlled Vital Signs Assessment: post-procedure vital signs reviewed and stable Respiratory status: spontaneous breathing, nonlabored ventilation and respiratory function stable Cardiovascular status: stable and blood pressure returned to baseline Anesthetic complications: no   No notable events documented.  Last Vitals:  Vitals:   05/05/21 2215 05/05/21 2230  BP: 131/76 130/80  Pulse: 83 75  Resp: 19 15  Temp: 36.7 C   SpO2: 100% 96%    Last Pain:  Vitals:   05/05/21 2230  TempSrc:   PainSc: 0-No pain                 Audry Pili

## 2021-05-05 NOTE — Interval H&P Note (Signed)
History and Physical Interval Note:  05/05/2021 8:52 PM  Paul Duncan  has presented today for surgery, with the diagnosis of esophageal food impaction.  The various methods of treatment have been discussed with the patient and family. After consideration of risks, benefits and other options for treatment, the patient has consented to  Procedure(s): ESOPHAGOGASTRODUODENOSCOPY (EGD) (N/A) as a surgical intervention.  The patient's history has been reviewed, patient examined, no change in status, stable for surgery.  I have reviewed the patient's chart and labs.  Questions were answered to the patient's satisfaction.     Nelida Meuse III

## 2021-05-05 NOTE — ED Triage Notes (Signed)
Patient complains of feeling like something is stuck in his throat after eating chicken earlier today. Speaking in complete sentences and in no apparent distress at this time.

## 2021-05-05 NOTE — H&P (View-Only) (Signed)
[facility] Consult Note   History Paul Duncan MRN # [id number]  Date of Admission: 05/05/2021 Date of Consultation: 05/05/2021 Referring physician: Dr. Rayne Du att. providers found Primary Care Provider: [name], PA Primary Gastroenterologist: None   Reason for Consultation/Chief Complaint: Urgent consult from ED for esophageal food bolus impaction  Subjective  HPI:  This is a 46 year old man with no chronic GI history who presented to the ED this afternoon with severe dysphagia after eating a chicken and noodles restaurant today.  He reports occasional episodes of dysphagia occurring perhaps every few months and he has not previously sought medical attention for that.  After the episode today he was unable to swallow his secretions, he had persistent chest pressure and retching up fluid.  He has been unable to swallow anything since arrival in the ED, and continues to retch up fluid and small food particles.  He did not improve with a trial of glucagon, had negative soft tissue films of the neck, specifically no bones were seen from the food ingestion, and he requires urgent upper endoscopy.  ROS:  No chronic chest pain or dyspnea.  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  This patient reports that he has a routine appointment with cardiology early next week.  All other systems are negative except as noted above in the HPI  Past Medical History Past Medical History:  Diagnosis Date   Brain tumor [facility])    Coronary artery disease    Hypertension    Seizures ([place])     Past Surgical History Past Surgical History:  Procedure Laterality Date   BRAIN SURGERY     BRAIN SURGERY  [phone]   CORONARY ARTERY BYPASS GRAFT N/A 12/23/2017   Procedure: CORONARY ARTERY BYPASS GRAFTING (CABG) x 5: Using Left Internal Mammary Artery (LIMA) and Endoscopically Harvested  Right Thigh/Calf and Left Thigh Greater Saphenous Vein Grafts (SVG);   LIMA to LAD, SVG to Diag1, SVG to PDA, SVG to OM1 and OM2; / and Repair of Vertebral Laceration and Removal of [name] Introducer;  Surgeon: [name], MD;  Location: [facility];  Service: Open Hear   LEFT HEART CATH AND CORONARY ANGIOGRAPHY N/A 12/18/2017   Procedure: LEFT HEART CATH AND CORONARY ANGIOGRAPHY;  Surgeon: [name], MD;  Location: [facility];  Service: Cardiovascular;  Laterality: N/A;   TEE WITHOUT CARDIOVERSION N/A 12/23/2017   Procedure: TRANSESOPHAGEAL ECHOCARDIOGRAM (TEE);  Surgeon: [name], MD;  Location: [place] [facility];  Service: Open Heart Surgery;  Laterality: N/A;    Family History Family History  Problem Relation Age of Onset   Diabetes Mother    Heart disease Father     Social History Social History   Socioeconomic History   Marital status: Single    Spouse name: Not on file   Number of children: Not on file   Years of education: Not on file   Highest education level: Not on file  Occupational History   Not on file  Tobacco Use   Smoking status: Never   Smokeless tobacco: Never  Substance and Sexual Activity   Alcohol use: No   Drug use: No   Sexual activity: Not on file  Other Topics Concern   Not on file  Social History Narrative   ** Merged History Encounter **       Social Determinants of Health   Financial Resource Strain:  Not on file  Food Insecurity: Not on file  Transportation Needs: Not on file  Physical Activity: Not on file  Stress: Not on file  Social Connections: Not on file   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 He is on no antiplatelet agents or anticoagulants.   _____________________________________________________________________ Objective   Exam:  Current vital signs  Patient  Vitals for the past 8 hrs:  BP Temp Temp src Pulse Resp SpO2 Height Weight  05/05/21 2010 137/83 -- -- 69 15 98 % -- --  05/05/21 1725 133/83 98.3 F (36.8 C) Oral 93 18 100 % -- --  05/05/21 1515 -- -- -- -- -- -- 5\' 6"  (1.676 m) 93 kg  05/05/21 1339 134/73 98.5 F (36.9 C) Oral 82 16 98 % -- --   No intake or output data in the 24 hours ending 05/05/21 2044  Physical Exam:   General: this is a well-appearing middle-aged male patient who is alert, conversational and intermittently retching  Eyes: sclera anicteric, no redness ENT: oral mucosa moist without lesions, no cervical or supraclavicular lymphadenopathy, good dentition CV: RRR without murmur, S1/S2, no JVD,, no peripheral edema Resp: clear to auscultation bilaterally, normal RR and effort noted GI: soft, no tenderness, with active bowel sounds. No guarding or palpable organomegaly noted   Labs:  Negative flu and COVID test today _________________________________________________________ Radiologic studies:  CLINICAL DATA:  Globus sensation   EXAM: NECK SOFT TISSUES - 1+ VIEW   COMPARISON:  None.   FINDINGS: There is no evidence of retropharyngeal soft tissue swelling or epiglottic enlargement. The cervical airway is unremarkable and no radio-opaque foreign body identified within the airway or overlying the cervical esophagus to the level of C5-C6. There are surgical clips along the right neck. Partially visualized sternotomy wires.   IMPRESSION: Negative soft tissue neck radiographs.     Electronically Signed   By: Maurine Simmering M.D.   On: 05/05/2021 14:23  ______________________________________________________ Other studies:   _______________________________________________________ Assessment & Plan  Impression: Severe acute esophageal dysphagia, he almost certainly has an esophageal food bolus impaction.  Patient arrived in ED triage about 1:45 PM, called the GI service from ED provider about 5:15  PM. Procedure delayed a few hours due to anesthesia availability for multiple traumas.  I recommended that he undergo upper endoscopy with relief of food bolus impaction while under general anesthesia.  He was agreeable after discussion of the procedure and risks, which was done in the presence of the endoscopy nurse.  The benefits and risks of the planned procedure were described in detail with the patient or (when appropriate) their health care proxy.  Risks were outlined as including, but not limited to, bleeding, infection, perforation, adverse medication reaction leading to cardiac or pulmonary decompensation, pancreatitis (if ERCP).  The limitation of incomplete mucosal visualization was also discussed.  No guarantees or warranties were given.  He also understands there is the possibility we may perform esophageal dilation if indicated and feasible.  This patient will most likely be able to discharge home from the ED after recovery from anesthesia this evening, and then we would arrange follow-up in my office.  Thank you for the courtesy of this consult.  Please contact me with any questions or concerns.  Nelida Meuse III Office: [phone]

## 2021-05-05 NOTE — Anesthesia Preprocedure Evaluation (Addendum)
Anesthesia Evaluation  Patient identified by MRN, date of birth, ID band Patient awake    Reviewed: Allergy & Precautions, NPO status , Patient's Chart, lab work & pertinent test results, reviewed documented beta blocker date and time   History of Anesthesia Complications Negative for: history of anesthetic complications  Airway Mallampati: III  TM Distance: >3 FB Neck ROM: Full    Dental  (+) Dental Advisory Given, Chipped,    Pulmonary neg pulmonary ROS,    Pulmonary exam normal        Cardiovascular hypertension, Pt. on medications and Pt. on home beta blockers (-) angina+ CAD, + Past MI and + CABG  Normal cardiovascular exam   '20 TTE - EF 50% to 55%. Hypokinesis of the apicalanteroseptal and apical myocardium. Mild MR and AI. LA was mildly dilated.     Neuro/Psych Seizures - (none since time of brain surgery), Well Controlled,   Hx brain tumor s/p resection  negative psych ROS   GI/Hepatic Neg liver ROS,  Food impaction    Endo/Other   Obesity Pre-DM   Renal/GU  Solitary kidney since birth      Musculoskeletal negative musculoskeletal ROS (+)   Abdominal   Peds  Hematology negative hematology ROS (+)   Anesthesia Other Findings   Reproductive/Obstetrics                            Anesthesia Physical Anesthesia Plan  ASA: 3  Anesthesia Plan: General   Post-op Pain Management:    Induction: Intravenous and Rapid sequence  PONV Risk Score and Plan: 2 and Treatment may vary due to age or medical condition, Ondansetron, Dexamethasone and Midazolam  Airway Management Planned: Oral ETT  Additional Equipment: None  Intra-op Plan:   Post-operative Plan: Extubation in OR  Informed Consent: I have reviewed the patients History and Physical, chart, labs and discussed the procedure including the risks, benefits and alternatives for the proposed anesthesia with the patient  or authorized representative who has indicated his/her understanding and acceptance.     Dental advisory given  Plan Discussed with: CRNA and Anesthesiologist  Anesthesia Plan Comments:        Anesthesia Quick Evaluation

## 2021-05-05 NOTE — ED Provider Notes (Signed)
[facility] EMERGENCY DEPARTMENT Provider Note   CSN: [id number] Arrival date & time: 05/05/21  1335     History  Chief Complaint  Patient presents with   Airway Obstruction    Pt feels like there is something there, is trying to cough it up, doesn't c/o of pain or swelling just expresses that he can feel something when he swallow     Paul Duncan is a 46 y.o. male who presents to the ED today for FB sensation in throat that occurred while eating lunch today.  Patient states that he was at an [employer] and eating chicken and noodles.  He states that he felt like part of the chicken got caught in his throat.  He states that since that time he has been attempting to cough up what ever he feels in his throat without success.  He states that he tried to drink water to push it down however this came back up as well.  Patient has never had issues with this in the past.  Denies any history of esophageal manometry.  He has no other complaints at this time.   The history is provided by the patient and medical records.      Home Medications Prior to Admission medications   Medication Sig Start Date End Date Taking? Authorizing Provider  aspirin EC 81 MG tablet Take 81 mg by mouth daily.    [provider]  atorvastatin (LIPITOR) 80 MG tablet TAKE 1 TABLET (80 MG TOTAL) BY MOUTH DAILY AT 6 PM. PATIENT NEEDS APPOINTMENT FOR FUTURE REFILLS. 1ST ATTEMPT. 01/20/21   Martinique, Peter M, MD  metoprolol tartrate (LOPRESSOR) 50 MG tablet TAKE 1/2 TABLET BY MOUTH 2 TIMES A DAY 02/17/21   Martinique, Peter M, MD      Allergies    Patient has no known allergies.    Review of Systems   Review of Systems  Constitutional:  Negative for chills and fever.  HENT:  Negative for drooling.        + FB sensation in throat  Respiratory:  Negative for shortness of breath and wheezing.   All other systems reviewed and are negative.  Physical Exam Updated Vital Signs BP 133/83     Pulse 93    Temp 98.3 F (36.8 C) (Oral)    Resp 18    Ht 5\' 6"  (1.676 m)    Wt 93 kg    SpO2 100%    BMI 33.09 kg/m  Physical Exam Vitals and nursing note reviewed.  Constitutional:      Appearance: He is not ill-appearing or diaphoretic.     Comments: Pt actively coughing/spitting up. Speaking in full sentences however slight discomfort with doing so. Tolerating secretions.   HENT:     Head: Normocephalic and atraumatic.     Mouth/Throat:     Mouth: Mucous membranes are moist.     Pharynx: Oropharynx is clear. No oropharyngeal exudate or posterior oropharyngeal erythema.     Comments: No foreign body visualized.  Eyes:     General: No scleral icterus.    Conjunctiva/sclera: Conjunctivae normal.  Cardiovascular:     Rate and Rhythm: Normal rate and regular rhythm.  Pulmonary:     Effort: Pulmonary effort is normal.     Breath sounds: Normal breath sounds. No wheezing, rhonchi or rales.  Abdominal:     Palpations: Abdomen is soft.     Tenderness: There is no abdominal tenderness.  Musculoskeletal:  Cervical back: Neck supple.  Skin:    General: Skin is warm and dry.  Neurological:     Mental Status: He is alert.    ED Results / Procedures / Treatments   Labs (all labs ordered are listed, but only abnormal results are displayed) Labs Reviewed  CBG MONITORING, ED - Abnormal; Notable for the following components:      Result Value   Glucose-Capillary 120 (*)    All other components within normal limits  RESP PANEL BY RT-PCR (FLU A&B, COVID) [id number]    EKG None  Radiology DG Neck Soft Tissue  Result Date: 05/05/2021 CLINICAL DATA:  Globus sensation EXAM: NECK SOFT TISSUES - 1+ VIEW COMPARISON:  None. FINDINGS: There is no evidence of retropharyngeal soft tissue swelling or epiglottic enlargement. The cervical airway is unremarkable and no radio-opaque foreign body identified within the airway or overlying the cervical esophagus to the level of C5-C6. There are  surgical clips along the right neck. Partially visualized sternotomy wires. IMPRESSION: Negative soft tissue neck radiographs. Electronically Signed   By: Maurine Simmering M.D.   On: 05/05/2021 14:23    Procedures Procedures    Medications Ordered in ED Medications  sterile water (preservative free) injection (  Not Given 05/05/21 1815)  glucagon (human recombinant) (GLUCAGEN) injection 1 mg (1 mg Intramuscular Given 05/05/21 1653)  ondansetron (ZOFRAN) injection 4 mg (4 mg Intravenous Given 05/05/21 1814)    ED Course/ Medical Decision Making/ A&P                           Medical Decision Making 46 year old male who presents to the ED today after feeling like a piece of chicken is stuck in his throat from lunchtime.  Has been attempting to cough up the piece of chicken since that time.  Attempted to drink however states that this came right back up.  On arrival to the ED vitals are stable.  Patient was medically screened and x-ray of his neck was ordered.  No foreign body appreciated.  On my exam patient is able to speak in full sentences however has some discomfort with doing so.  He is tolerating his own secretions however actively coughing/spitting up periodically.  We will plan to provide glucagon and reevaluate.  May consider consulting GI if no improvement for EGD.   5:36 PM Pt reported mild improvement after glucagon. Attempted to have pt drink water however vomited shortly afterwards. Will consult GI at this time with plan for likely EGD.   Amount and/or Complexity of Data Reviewed Radiology: ordered. Decision-making details documented in ED Course. Discussion of management or test interpretation with external provider(s): Discussed case with gastroenterologist Dr. Loletha Carrow who agrees with EGD. Pt to be taken to endoscopy suite once anesthesia/GI team ready for him. Anesthesia is currently backed up with multiple traumas and it may take a couple of hours. Patient aware.   Risk Prescription  drug management.          Final Clinical Impression(s) / ED Diagnoses Final diagnoses:  Food impaction of esophagus, initial encounter    Rx / DC Orders ED Discharge Orders     None         Eustaquio Maize, PA-C 05/05/21 1816    Tegeler, Gwenyth Allegra, MD 05/05/21 2322

## 2021-05-05 NOTE — Consult Note (Signed)
[facility] Consult Note   History Paul Duncan MRN # [id number]  Date of Admission: 05/05/2021 Date of Consultation: 05/05/2021 Referring physician: Dr. Rayne Du att. providers found Primary Care Provider: [name], PA Primary Gastroenterologist: None   Reason for Consultation/Chief Complaint: Urgent consult from ED for esophageal food bolus impaction  Subjective  HPI:  This is a 46 year old man with no chronic GI history who presented to the ED this afternoon with severe dysphagia after eating a chicken and noodles restaurant today.  He reports occasional episodes of dysphagia occurring perhaps every few months and he has not previously sought medical attention for that.  After the episode today he was unable to swallow his secretions, he had persistent chest pressure and retching up fluid.  He has been unable to swallow anything since arrival in the ED, and continues to retch up fluid and small food particles.  He did not improve with a trial of glucagon, had negative soft tissue films of the neck, specifically no bones were seen from the food ingestion, and he requires urgent upper endoscopy.  ROS:  No chronic chest pain or dyspnea.  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  This patient reports that he has a routine appointment with cardiology early next week.  All other systems are negative except as noted above in the HPI  Past Medical History Past Medical History:  Diagnosis Date   Brain tumor [facility])    Coronary artery disease    Hypertension    Seizures ([place])     Past Surgical History Past Surgical History:  Procedure Laterality Date   BRAIN SURGERY     BRAIN SURGERY  [phone]   CORONARY ARTERY BYPASS GRAFT N/A 12/23/2017   Procedure: CORONARY ARTERY BYPASS GRAFTING (CABG) x 5: Using Left Internal Mammary Artery (LIMA) and Endoscopically Harvested  Right Thigh/Calf and Left Thigh Greater Saphenous Vein Grafts (SVG);   LIMA to LAD, SVG to Diag1, SVG to PDA, SVG to OM1 and OM2; / and Repair of Vertebral Laceration and Removal of [name] Introducer;  Surgeon: [name], MD;  Location: [place];  Service: Open Hear   LEFT HEART CATH AND CORONARY ANGIOGRAPHY N/A 12/18/2017   Procedure: LEFT HEART CATH AND CORONARY ANGIOGRAPHY;  Surgeon: [name], MD;  Location: [place] [facility];  Service: Cardiovascular;  Laterality: N/A;   TEE WITHOUT CARDIOVERSION N/A 12/23/2017   Procedure: TRANSESOPHAGEAL ECHOCARDIOGRAM (TEE);  Surgeon: [name], MD;  Location: [place];  Service: Open Heart Surgery;  Laterality: N/A;    Family History Family History  Problem Relation Age of Onset   Diabetes Mother    Heart disease Father     Social History Social History   Socioeconomic History   Marital status: Single    Spouse name: Not on file   Number of children: Not on file   Years of education: Not on file   Highest education level: Not on file  Occupational History   Not on file  Tobacco Use   Smoking status: Never   Smokeless tobacco: Never  Substance and Sexual Activity   Alcohol use: No   Drug use: No   Sexual activity: Not on file  Other Topics Concern   Not on file  Social History Narrative   ** Merged History Encounter **       Social Determinants of Health   Financial Resource Strain:  Not on file  Food Insecurity: Not on file  Transportation Needs: Not on file  Physical Activity: Not on file  Stress: Not on file  Social Connections: Not on file   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 He is on no antiplatelet agents or anticoagulants.   _____________________________________________________________________ Objective   Exam:  Current vital signs  Patient  Vitals for the past 8 hrs:  BP Temp Temp src Pulse Resp SpO2 Height Weight  05/05/21 2010 137/83 -- -- 69 15 98 % -- --  05/05/21 1725 133/83 98.3 F (36.8 C) Oral 93 18 100 % -- --  05/05/21 1515 -- -- -- -- -- -- 5\' 6"  (1.676 m) 93 kg  05/05/21 1339 134/73 98.5 F (36.9 C) Oral 82 16 98 % -- --   No intake or output data in the 24 hours ending 05/05/21 2044  Physical Exam:   General: this is a well-appearing middle-aged male patient who is alert, conversational and intermittently retching  Eyes: sclera anicteric, no redness ENT: oral mucosa moist without lesions, no cervical or supraclavicular lymphadenopathy, good dentition CV: RRR without murmur, S1/S2, no JVD,, no peripheral edema Resp: clear to auscultation bilaterally, normal RR and effort noted GI: soft, no tenderness, with active bowel sounds. No guarding or palpable organomegaly noted   Labs:  Negative flu and COVID test today _________________________________________________________ Radiologic studies:  CLINICAL DATA:  Globus sensation   EXAM: NECK SOFT TISSUES - 1+ VIEW   COMPARISON:  None.   FINDINGS: There is no evidence of retropharyngeal soft tissue swelling or epiglottic enlargement. The cervical airway is unremarkable and no radio-opaque foreign body identified within the airway or overlying the cervical esophagus to the level of C5-C6. There are surgical clips along the right neck. Partially visualized sternotomy wires.   IMPRESSION: Negative soft tissue neck radiographs.     Electronically Signed   By: Maurine Simmering M.D.   On: 05/05/2021 14:23  ______________________________________________________ Other studies:   _______________________________________________________ Assessment & Plan  Impression: Severe acute esophageal dysphagia, he almost certainly has an esophageal food bolus impaction.  Patient arrived in ED triage about 1:45 PM, called the GI service from ED provider about 5:15  PM. Procedure delayed a few hours due to anesthesia availability for multiple traumas.  I recommended that he undergo upper endoscopy with relief of food bolus impaction while under general anesthesia.  He was agreeable after discussion of the procedure and risks, which was done in the presence of the endoscopy nurse.  The benefits and risks of the planned procedure were described in detail with the patient or (when appropriate) their health care proxy.  Risks were outlined as including, but not limited to, bleeding, infection, perforation, adverse medication reaction leading to cardiac or pulmonary decompensation, pancreatitis (if ERCP).  The limitation of incomplete mucosal visualization was also discussed.  No guarantees or warranties were given.  He also understands there is the possibility we may perform esophageal dilation if indicated and feasible.  This patient will most likely be able to discharge home from the ED after recovery from anesthesia this evening, and then we would arrange follow-up in my office.  Thank you for the courtesy of this consult.  Please contact me with any questions or concerns.  Nelida Meuse III Office: [phone]

## 2021-05-07 ENCOUNTER — Encounter (HOSPITAL_COMMUNITY): Payer: Self-pay | Admitting: Gastroenterology

## 2021-05-08 ENCOUNTER — Encounter: Payer: Self-pay | Admitting: Cardiology

## 2021-05-08 ENCOUNTER — Other Ambulatory Visit: Payer: Self-pay

## 2021-05-08 ENCOUNTER — Ambulatory Visit (INDEPENDENT_AMBULATORY_CARE_PROVIDER_SITE_OTHER): Payer: 59 | Admitting: Cardiology

## 2021-05-08 VITALS — BP 126/80 | HR 62 | Ht 66.0 in | Wt 218.0 lb

## 2021-05-08 DIAGNOSIS — I25810 Atherosclerosis of coronary artery bypass graft(s) without angina pectoris: Secondary | ICD-10-CM | POA: Diagnosis not present

## 2021-05-08 DIAGNOSIS — I10 Essential (primary) hypertension: Secondary | ICD-10-CM

## 2021-05-08 DIAGNOSIS — Z951 Presence of aortocoronary bypass graft: Secondary | ICD-10-CM | POA: Diagnosis not present

## 2021-05-08 DIAGNOSIS — E785 Hyperlipidemia, unspecified: Secondary | ICD-10-CM | POA: Diagnosis not present

## 2021-05-08 MED ORDER — ATORVASTATIN CALCIUM 80 MG PO TABS: 80.0000 mg | ORAL_TABLET | Freq: Every day | ORAL | 3 refills | Status: DC

## 2021-05-08 MED ORDER — METOPROLOL TARTRATE 50 MG PO TABS: ORAL_TABLET | ORAL | 3 refills | Status: AC

## 2021-05-12 LAB — SURGICAL PATHOLOGY

## 2021-05-15 ENCOUNTER — Telehealth: Payer: Self-pay | Admitting: Gastroenterology

## 2021-05-15 NOTE — Telephone Encounter (Signed)
Patient called.  He was discharged from the hospital on generic Prilosec.  He has an appointment with Dr. Loletha Carrow on 4/4.  Should he continue the Prilosec until he comes in for his appointment with Dr. Loletha Carrow?  Please call patient and advise.  Thank you. ?

## 2021-05-15 NOTE — Telephone Encounter (Signed)
Returned call to patient. I advised him that he should continue Omeprazole 20 mg BID until further assessment at his follow up appt. Pt verbalized understanding and had no concerns at the end of the call. ?

## 2021-06-13 ENCOUNTER — Ambulatory Visit: Payer: 59 | Admitting: Gastroenterology

## 2021-06-21 DIAGNOSIS — E669 Obesity, unspecified: Secondary | ICD-10-CM | POA: Insufficient documentation

## 2021-06-21 DIAGNOSIS — D72829 Elevated white blood cell count, unspecified: Secondary | ICD-10-CM | POA: Insufficient documentation

## 2021-06-21 DIAGNOSIS — K2940 Chronic atrophic gastritis without bleeding: Secondary | ICD-10-CM | POA: Insufficient documentation

## 2021-06-21 DIAGNOSIS — R7303 Prediabetes: Secondary | ICD-10-CM | POA: Insufficient documentation

## 2021-06-21 DIAGNOSIS — K209 Esophagitis, unspecified without bleeding: Secondary | ICD-10-CM | POA: Insufficient documentation

## 2021-06-21 DIAGNOSIS — I214 Non-ST elevation (NSTEMI) myocardial infarction: Secondary | ICD-10-CM | POA: Insufficient documentation

## 2021-06-21 DIAGNOSIS — E785 Hyperlipidemia, unspecified: Secondary | ICD-10-CM | POA: Insufficient documentation

## 2021-06-30 ENCOUNTER — Ambulatory Visit (INDEPENDENT_AMBULATORY_CARE_PROVIDER_SITE_OTHER): Payer: 59 | Admitting: Gastroenterology

## 2021-06-30 ENCOUNTER — Encounter: Payer: Self-pay | Admitting: Gastroenterology

## 2021-06-30 VITALS — BP 134/80 | HR 56 | Ht 66.0 in | Wt 220.0 lb

## 2021-06-30 DIAGNOSIS — K222 Esophageal obstruction: Secondary | ICD-10-CM

## 2021-06-30 DIAGNOSIS — T18128A Food in esophagus causing other injury, initial encounter: Secondary | ICD-10-CM

## 2021-06-30 DIAGNOSIS — K319 Disease of stomach and duodenum, unspecified: Secondary | ICD-10-CM | POA: Diagnosis not present

## 2021-06-30 DIAGNOSIS — Z1211 Encounter for screening for malignant neoplasm of colon: Secondary | ICD-10-CM

## 2021-06-30 DIAGNOSIS — K3189 Other diseases of stomach and duodenum: Secondary | ICD-10-CM

## 2021-06-30 MED ORDER — NA SULFATE-K SULFATE-MG SULF 17.5-3.13-1.6 GM/177ML PO SOLN: 1.0000 | Freq: Once | ORAL | 0 refills | Status: AC

## 2021-06-30 NOTE — Progress Notes (Addendum)
[facility] Progress Note  Chief Complaint: Esophageal dysphagia, status post food impaction  Subjective  History: Paul Duncan follows up with me after a food impaction that brought him to the ED on 05/05/2021. Upper endoscopy removed a large bolus of meat from the proximal esophagus, no stricture was seen, and there were nonspecific esophageal mucosal changes biopsied as well as some nonspecific erosive gastropathy and duodenopathy also biopsied.  (See below) Suezanne Jacquet is glad to report that he feels well.  Prior to the impaction, he would have occasional episodes of heartburn or dysphagia that he attributed to eating too much or too quickly.  He denies chronic abdominal pain, nausea vomiting or weight loss.  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 He denies altered bowel habits or rectal bleeding. ROS: Cardiovascular:  no chest pain Respiratory: no dyspnea Denies angina The patient's Past Medical, Family and Social History were reviewed and are on file in the EMR. Past Medical History:  Diagnosis Date   Brain tumor [facility])    Coronary artery disease    Hypertension    Seizures [facility])    Last cardiology office note of 05/08/2021 was reviewed.  Cardiac condition stable. His initial ischemic cardiomyopathy improved with a normal LVEF post revascularization.  Past Surgical History:  Procedure Laterality Date   BIOPSY  05/05/2021   Procedure: BIOPSY;  Surgeon: [name], MD;  Location: [place];  Service: Gastroenterology;;   BRAIN SURGERY     BRAIN SURGERY  [phone]   CORONARY ARTERY BYPASS GRAFT N/A 12/23/2017   Procedure: CORONARY ARTERY BYPASS GRAFTING (CABG) x 5: Using Left Internal Mammary Artery (LIMA) and Endoscopically Harvested Right Thigh/Calf and Left Thigh Greater Saphenous Vein Grafts (SVG);   LIMA to LAD, SVG to Diag1, SVG to PDA, SVG to OM1 and OM2; / and Repair of Vertebral Laceration and Removal of [name] Introducer;  Surgeon: [name], MD;  Location: [place];  Service: Open [place]   ESOPHAGOGASTRODUODENOSCOPY N/A 05/05/2021   Procedure: ESOPHAGOGASTRODUODENOSCOPY (EGD);  Surgeon: [name], MD;  Location: [place];  Service: Gastroenterology;  Laterality: N/A;   FOREIGN BODY REMOVAL  05/05/2021   Procedure: FOREIGN BODY REMOVAL;  Surgeon: [name], MD;  Location: [place] [facility];  Service: Gastroenterology;;   LEFT HEART CATH AND CORONARY ANGIOGRAPHY N/A 12/18/2017   Procedure: LEFT HEART CATH AND CORONARY ANGIOGRAPHY;  Surgeon: [name], MD;  Location: [place] [facility];  Service: Cardiovascular;  Laterality: N/A;   TEE WITHOUT CARDIOVERSION N/A 12/23/2017   Procedure: TRANSESOPHAGEAL ECHOCARDIOGRAM (TEE);  Surgeon: [name], MD;  Location: [place];  Service: Open Heart Surgery;  Laterality: N/A;    Objective:  Med list reviewed  Current Outpatient Medications:    aspirin EC 81 MG tablet, Take 81 mg by mouth daily., Disp: , Rfl:    atorvastatin (LIPITOR) 80 MG tablet, Take 1 tablet (80 mg total) by mouth daily at 6 PM. Patient needs appointment for future refills. 1st attempt., Disp: 90 tablet, Rfl: 3   losartan (COZAAR) 50 MG tablet, Take 1 tablet by mouth daily., Disp: , Rfl:    metoprolol tartrate (LOPRESSOR) 50 MG tablet, TAKE 1/2 TABLET BY MOUTH 2 TIMES A DAY, Disp: 90 tablet, Rfl: 3   omeprazole (PRILOSEC) 20 MG capsule, Take 20 mg by mouth 2 (two) times daily before a meal., Disp: , Rfl:    Vital signs in last 24 hrs: Vitals:  06/30/21 0949  BP: 134/80  Pulse: (!) 56   Wt Readings from Last 3 Encounters:  06/30/21 220 lb (99.8 kg)  05/08/21 218 lb (98.9 kg)  05/05/21 205 lb (93 kg)    Physical Exam  Well-appearing HEENT: sclera anicteric, oral mucosa moist without lesions Neck: supple, no thyromegaly, JVD or lymphadenopathy Cardiac: RRR without murmurs, S1S2 heard, no peripheral edema Pulm: clear to auscultation bilaterally, normal RR and effort noted Abdomen:  soft, no tenderness, with active bowel sounds. No guarding or palpable hepatosplenomegaly. Skin; warm and dry, no jaundice or rash  Labs:   ___________________________________________ Radiologic studies:   ____________________________________________ Other: A. STOMACH, RANDOM, BIOPSY:  - Gastric antral and oxyntic mucosa with mild chronic gastritis  - Helicobacter pylori-like organisms are not identified on routine HE  stain   B. ESOPHAGUS, DISTAL, BIOPSY:  - Active chronic esophagitis with ulceration, see comment  - Negative for increased intraepithelial eosinophils   C. ESOPHAGUS, PROXIMAL, BIOPSY:  - Chronic esophagitis, see comment  - Negative for increased intraepithelial eosinophils      COMMENT:   B and C.  Squamous mucosa shows marked basal cell hyperplasia.  There is  increased mitotic activity and atypia but is thought to be reactive in  nature.  Immunostain for Ki-67 does not show evidence of definitive  dysplasia.  Viral cytopathic changes or fungal organisms are not  identified.  The overall findings are nonspecific.  Etiology can include  chemical injury, thermal injury, drug effect, severe reflux and also  other rarer possibilities such as vasculitis.  Clinical and serologic  correlation is suggested    _____________________________________________ Assessment & Plan  Assessment: Encounter Diagnoses  Name Primary?   Esophageal obstruction due to food impaction Yes   NSAID-associated gastropathy    Special screening for malignant neoplasms, colon    Food impaction without stricture, evidence of significant reflux or any EOE. He appears to have just ingested too large a piece of meat without chewing it well. Recommended caution going forward.  H. pylori negative, mild gastropathy from aspirin, which he will need lifelong after CABG at a young age. Therefore, I recommended he take omeprazole 20 mg twice a week to decrease the chance of gastritis  recurrent, worsening or development of ulcer.  He is also age-appropriate for colorectal cancer screening at average risk. Plan: Medicine changes as above Screening colonoscopy.  He was agreeable after discussion of procedure and risks.  The benefits and risks of the planned procedure were described in detail with the patient or (when appropriate) their health care proxy.  Risks were outlined as including, but not limited to, bleeding, infection, perforation, adverse medication reaction leading to cardiac or pulmonary decompensation, pancreatitis (if ERCP).  The limitation of incomplete mucosal visualization was also discussed.  No guarantees or warranties were given.  Patient at increased risk for cardiopulmonary complications of procedure due to medical comorbidities.  (Multivessel CAD, obesity)   30 minutes were spent on this encounter (including chart review, history/exam, counseling/coordination of care, and documentation) > 50% of that time was spent on counseling and coordination of care.   Nelida Meuse III

## 2021-06-30 NOTE — Patient Instructions (Signed)
If you are age 48 or older, your body mass index should be between 23-30. Your Body mass index is 35.51 kg/m?Marland Kitchen If this is out of the aforementioned range listed, please consider follow up with your Primary Care Provider. ? ?If you are age 39 or younger, your body mass index should be between 19-25. Your Body mass index is 35.51 kg/m?Marland Kitchen If this is out of the aformentioned range listed, please consider follow up with your Primary Care Provider.  ? ?________________________________________________________ ? ?The [facility] providers would like to encourage you to use [facility] to communicate with providers for non-urgent requests or questions.  Due to long hold times on the telephone, sending your provider a message by [facility] may be a faster and more efficient way to get a response.  Please allow 48 business hours for a response.  Please remember that this is for non-urgent requests.  ?_______________________________________________________ ? ?You have been scheduled for a colonoscopy. Please follow written instructions given to you at your visit today.  ?Please pick up your prep supplies at the pharmacy within the next 1-3 days. ?If you use inhalers (even only as needed), please bring them with you on the day of your procedure. ? ?Due to recent changes in healthcare laws, you may see the results of your imaging and laboratory studies on MyChart before your provider has had a chance to review them.  We understand that in some cases there may be results that are confusing or concerning to you. Not all laboratory results come back in the same time frame and the provider may be waiting for multiple results in order to interpret others.  Please give [place] 48 hours in order for your provider to thoroughly review all the results before contacting the office for clarification of your results.  ? ?It was a pleasure to see you today! ? ?Thank you for trusting me with your gastrointestinal care!   ? ? ?

## 2021-07-20 ENCOUNTER — Encounter: Payer: Self-pay | Admitting: Gastroenterology

## 2021-07-23 ENCOUNTER — Encounter: Payer: Self-pay | Admitting: Certified Registered Nurse Anesthetist

## 2021-07-27 ENCOUNTER — Ambulatory Visit (AMBULATORY_SURGERY_CENTER): Payer: 59 | Admitting: Gastroenterology

## 2021-07-27 ENCOUNTER — Encounter: Payer: Self-pay | Admitting: Gastroenterology

## 2021-07-27 VITALS — BP 115/63 | HR 64 | Temp 97.8°F | Resp 17 | Ht 66.0 in | Wt 220.0 lb

## 2021-07-27 DIAGNOSIS — Z1211 Encounter for screening for malignant neoplasm of colon: Secondary | ICD-10-CM

## 2021-07-27 DIAGNOSIS — D123 Benign neoplasm of transverse colon: Secondary | ICD-10-CM

## 2021-07-27 DIAGNOSIS — D122 Benign neoplasm of ascending colon: Secondary | ICD-10-CM | POA: Diagnosis not present

## 2021-07-27 MED ORDER — SODIUM CHLORIDE 0.9 % IV SOLN: 500.0000 mL | INTRAVENOUS | Status: DC

## 2021-07-27 NOTE — Progress Notes (Signed)
Report given to PACU, vss 

## 2021-07-27 NOTE — Op Note (Signed)
Paul Duncan: Paul Duncan Procedure Date: 07/27/2021 3:42 PM MRN: [id number] Endoscopist: Mallie Mussel L. Loletha Carrow , MD Age: 46 Referring MD:  Date of Birth: 11-17-1975 Gender: Male Account #: [id number] Procedure:                Colonoscopy Indications:              Screening for colorectal malignant neoplasm, This                            is the patient's first colonoscopy Medicines:                Monitored Anesthesia Care Procedure:                Pre-Anesthesia Assessment:                           - Prior to the procedure, a History and Physical                            was performed, and patient medications and                            allergies were reviewed. The patient's tolerance of                            previous anesthesia was also reviewed. The risks                            and benefits of the procedure and the sedation                            options and risks were discussed with the patient.                            All questions were answered, and informed consent                            was obtained. Prior Anticoagulants: The patient has                            taken no previous anticoagulant or antiplatelet                            agents. ASA Grade Assessment: II - A patient with                            mild systemic disease. After reviewing the risks                            and benefits, the patient was deemed in                            satisfactory condition to undergo the procedure.  After obtaining informed consent, the colonoscope                            was passed under direct vision. Throughout the                            procedure, the patient's blood pressure, pulse, and                            oxygen saturations were monitored continuously. The                            CF [id number] #[id number] was introduced through the anus                            and advanced to the the  cecum, identified by                            appendiceal orifice and ileocecal valve. The                            colonoscopy was performed without difficulty. The                            patient tolerated the procedure well. The quality                            of the bowel preparation was excellent. The                            ileocecal valve, appendiceal orifice, and rectum                            were photographed. Scope In: 3:46:22 PM Scope Out: 4:00:49 PM Scope Withdrawal Time: 0 hours 12 minutes 29 seconds  Total Procedure Duration: 0 hours 14 minutes 27 seconds  Findings:                 The perianal and digital rectal examinations were                            normal.                           Repeat examination of right colon under NBI                            performed.                           Two semi-sessile polyps were found in the                            transverse colon and ascending colon. The polyps  were diminutive in size. These polyps were removed                            with a cold snare. Resection and retrieval were                            complete.                           Multiple diverticula were found in the left colon.                           The exam was otherwise without abnormality on                            direct and retroflexion views. Complications:            No immediate complications. Estimated Blood Loss:     Estimated blood loss was minimal. Impression:               - Two diminutive polyps in the transverse colon and                            in the ascending colon, removed with a cold snare.                            Resected and retrieved.                           - Diverticulosis in the left colon.                           - The examination was otherwise normal on direct                            and retroflexion views. Recommendation:           - Patient has a contact  number available for                            emergencies. The signs and symptoms of potential                            delayed complications were discussed with the                            patient. Return to normal activities tomorrow.                            Written discharge instructions were provided to the                            patient.                           - Resume previous diet.                           -  Continue present medications.                           - Await pathology results.                           - Repeat colonoscopy is recommended for                            surveillance. The colonoscopy date will be                            determined after pathology results from today's                            exam become available for review. Cinsere Mizrahi L. Loletha Carrow, MD 07/27/2021 4:04:57 PM This report has been signed electronically.

## 2021-07-27 NOTE — Progress Notes (Signed)
Called to room to assist during endoscopic procedure.  Patient ID and intended procedure confirmed with present staff. Received instructions for my participation in the procedure from the performing physician.  

## 2021-07-27 NOTE — Patient Instructions (Signed)
Read all of the handouts given to you by your recovery room nurse.  YOU HAD AN ENDOSCOPIC PROCEDURE TODAY AT THE [facility]:   Refer to the procedure report that was given to you for any specific questions about what was found during the examination.  If the procedure report does not answer your questions, please call your gastroenterologist to clarify.  If you requested that your care partner not be given the details of your procedure findings, then the procedure report has been included in a sealed envelope for you to review at your convenience later.  YOU SHOULD EXPECT: Some feelings of bloating in the abdomen. Passage of more gas than usual.  Walking can help get rid of the air that was put into your GI tract during the procedure and reduce the bloating. If you had a lower endoscopy (such as a colonoscopy or flexible sigmoidoscopy) you may notice spotting of blood in your stool or on the toilet paper. If you underwent a bowel prep for your procedure, you may not have a normal bowel movement for a few days.  Please Note:  You might notice some irritation and congestion in your nose or some drainage.  This is from the oxygen used during your procedure.  There is no need for concern and it should clear up in a day or so.  SYMPTOMS TO REPORT IMMEDIATELY:  Following lower endoscopy (colonoscopy or flexible sigmoidoscopy):  Excessive amounts of blood in the stool  Significant tenderness or worsening of abdominal pains  Swelling of the abdomen that is new, acute  Fever of 100F or higher   For urgent or emergent issues, a gastroenterologist can be reached at any hour by calling [phone]. Do not use MyChart messaging for urgent concerns.    DIET:  We do recommend a small meal at first, but then you may proceed to your regular diet.  Drink plenty of fluids but you should avoid alcoholic beverages for 24 hours. Try to increase the fiber in your diet, and drink plenty of  water.  ACTIVITY:  You should plan to take it easy for the rest of today and you should NOT DRIVE or use heavy machinery until tomorrow (because of the sedation medicines used during the test).    FOLLOW UP: Our staff will call the number listed on your records 48-72 hours following your procedure to check on you and address any questions or concerns that you may have regarding the information given to you following your procedure. If we do not reach you, we will leave a message.  We will attempt to reach you two times.  During this call, we will ask if you have developed any symptoms of COVID 19. If you develop any symptoms (ie: fever, flu-like symptoms, shortness of breath, cough etc.) before then, please call [phone].  If you test positive for Covid 19 in the 2 weeks post procedure, please call and report this information to us.    If any biopsies were taken you will be contacted by phone or by letter within the next 1-3 weeks.  Please call us at [phone] if you have not heard about the biopsies in 3 weeks.    SIGNATURES/CONFIDENTIALITY: You and/or your care partner have signed paperwork which will be entered into your electronic medical record.  These signatures attest to the fact that that the information above on your After Visit Summary has been reviewed and is understood.  Full responsibility of the confidentiality of this   discharge information lies with you and/or your care-partner.  

## 2021-07-27 NOTE — Progress Notes (Signed)
No changes to clinical history since GI office visit on 06/30/21.  The patient is appropriate for an endoscopic procedure in the ambulatory setting.  - Wilfrid Lund, MD

## 2021-07-28 ENCOUNTER — Telehealth: Payer: Self-pay

## 2021-07-28 NOTE — Telephone Encounter (Signed)
  Follow up Call-     07/27/2021    3:16 PM  Call back number  Post procedure Call Back phone  # [phone]  Permission to leave phone message Yes     Patient questions:  Do you have a fever, pain , or abdominal swelling? No. Pain Score  0 *  Have you tolerated food without any problems? Yes.    Have you been able to return to your normal activities? No.  Do you have any questions about your discharge instructions: Diet   No. Medications  No. Follow up visit  No.  Do you have questions or concerns about your Care? No.  Actions: * If pain score is 4 or above: No action needed, pain <4.

## 2021-08-01 ENCOUNTER — Encounter: Payer: Self-pay | Admitting: Gastroenterology

## 2021-08-04 LAB — BASIC METABOLIC PANEL
BUN/Creatinine Ratio: 13 (ref 9–20)
BUN: 11 mg/dL (ref 6–24)
CO2: 26 mmol/L (ref 20–29)
Calcium: 9.1 mg/dL (ref 8.7–10.2)
Chloride: 103 mmol/L (ref 96–106)
Creatinine, Ser: 0.82 mg/dL (ref 0.76–1.27)
Glucose: 90 mg/dL (ref 70–99)
Potassium: 4.2 mmol/L (ref 3.5–5.2)
Sodium: 141 mmol/L (ref 134–144)
eGFR: 109 mL/min/{1.73_m2} (ref >59)

## 2021-08-04 LAB — LIPID PANEL
Chol/HDL Ratio: 3.2 ratio (ref 0.0–5.0)
Cholesterol, Total: 123 mg/dL (ref 100–199)
HDL: 38 mg/dL — ABNORMAL LOW (ref >39)
LDL Chol Calc (NIH): 65 mg/dL (ref 0–99)
Triglycerides: 105 mg/dL (ref 0–149)
VLDL Cholesterol Cal: 20 mg/dL (ref 5–40)

## 2021-08-04 LAB — HEPATIC FUNCTION PANEL
ALT: 19 IU/L (ref 0–44)
AST: 14 IU/L (ref 0–40)
Albumin: 4.2 g/dL (ref 4.0–5.0)
Alkaline Phosphatase: 103 IU/L (ref 44–121)
Bilirubin Total: 0.8 mg/dL (ref 0.0–1.2)
Bilirubin, Direct: 0.22 mg/dL (ref 0.00–0.40)
Total Protein: 6.5 g/dL (ref 6.0–8.5)

## 2021-09-06 ENCOUNTER — Telehealth: Payer: Self-pay | Admitting: Cardiology

## 2021-09-06 NOTE — Telephone Encounter (Signed)
*  STAT* If patient is at the pharmacy, call can be transferred to refill team.   1. Which medications need to be refilled? (please list name of each medication and dose if known)   atorvastatin (LIPITOR) 80 MG tablet    metoprolol tartrate (LOPRESSOR) 50 MG tablet    2. Which pharmacy/location (including street and city if local pharmacy) is medication to be sent to? [facility] [place], [place]  3. Do they need a 30 day or 90 day supply?  90 day

## 2021-10-12 DIAGNOSIS — C719 Malignant neoplasm of brain, unspecified: Secondary | ICD-10-CM | POA: Diagnosis not present

## 2022-04-11 DIAGNOSIS — E785 Hyperlipidemia, unspecified: Secondary | ICD-10-CM | POA: Diagnosis not present

## 2022-04-11 DIAGNOSIS — R7303 Prediabetes: Secondary | ICD-10-CM | POA: Diagnosis not present

## 2022-04-11 DIAGNOSIS — I10 Essential (primary) hypertension: Secondary | ICD-10-CM | POA: Diagnosis not present

## 2022-05-24 NOTE — Progress Notes (Signed)
Cardiology Office Note    Date:  05/25/2022   ID:  [facility], DOB September 05, 1975, MRN [id number]  PCP:  [name], PA  Cardiologist:  [name]  Chief Complaint  Patient presents with   Coronary Artery Disease    History of Present Illness:  [facility] is a 47 y.o. male with PMH of HTN, HLD and brain tumor s/p resection.  He was  admitted to the hospital on 12/18/2017 with a  NSTEMI.    Patient underwent cardiac catheterization on 12/18/2017 which showed EF 25 to 35%, 60% mid RCA lesion, 100% ostial OM 2 lesion, 30% ostial OM1 lesion, 100% mid LAD occlusion, 99% proximal LAD stenosis.  Bypass surgery was recommended if distal LAD territory is proven viable.  Echocardiogram obtained on the same day showed EF 40 to 45%, mild LVH, akinesis of the apical anterior and apical myocardium, akinesis of the mid anteroseptal myocardium.  MRI viability test shows there was substantial viability of the anterior wall on with only small area of scar at the apex.  Therefore the decision was made to proceed with bypass surgery.  Due to complication with sheath placement for the Deberah Pelton catheter was placed in the right common carotid artery, vascular surgery was called and the patient had to undergo exploration of the right neck, repair of the right internal jugular vein, and repair of the injury to the origin of the right vertebral artery on 12/23/2017.  Patient underwent CABG x5 with LIMA to LAD, SVG to diagonal, SVG to PDA and sequential SVG to OM1 and OM 2 by Dr. Servando Snare.   On follow up Echo in January 2020 there was significant improvement in LV function with EF 50-55%.  On follow up today he is doing well from a cardiac standpoint. Denies any chest pain, palpitations, dyspnea. He walks a few days a week. He has gained some weight. He was started on Zetia recently with blood work showing an LDL 80.    Past Medical History:  Diagnosis Date   Brain tumor [facility])    Coronary artery  disease    Hyperlipidemia    Hypertension    Myocardial infarction ([place])    Seizures ([facility])     Past Surgical History:  Procedure Laterality Date   BIOPSY  05/05/2021   Procedure: BIOPSY;  Surgeon: [name], MD;  Location: [facility];  Service: Gastroenterology;;   BRAIN SURGERY     BRAIN SURGERY  [id number]   CORONARY ARTERY BYPASS GRAFT N/A 12/23/2017   Procedure: CORONARY ARTERY BYPASS GRAFTING (CABG) x 5: Using Left Internal Mammary Artery (LIMA) and Endoscopically Harvested Right Thigh/Calf and Left Thigh Greater Saphenous Vein Grafts (SVG);   LIMA to LAD, SVG to Diag1, SVG to PDA, SVG to OM1 and OM2; / and Repair of Vertebral Laceration and Removal of [name] Introducer;  Surgeon: [name], MD;  Location: [facility];  Service: Open Hear   ESOPHAGOGASTRODUODENOSCOPY N/A 05/05/2021   Procedure: ESOPHAGOGASTRODUODENOSCOPY (EGD);  Surgeon: [name], MD;  Location: [facility];  Service: Gastroenterology;  Laterality: N/A;   FOREIGN BODY REMOVAL  05/05/2021   Procedure: FOREIGN BODY REMOVAL;  Surgeon: [name], MD;  Location: [facility];  Service: Gastroenterology;;   LEFT HEART CATH AND CORONARY ANGIOGRAPHY N/A 12/18/2017   Procedure: LEFT HEART CATH AND CORONARY ANGIOGRAPHY;  Surgeon: [name], MD;  Location: [facility];  Service: Cardiovascular;  Laterality: N/A;  TEE WITHOUT CARDIOVERSION N/A 12/23/2017   Procedure: TRANSESOPHAGEAL ECHOCARDIOGRAM (TEE);  Surgeon: [name], MD;  Location: [facility];  Service: Open Heart Surgery;  Laterality: N/A;    Current Medications: Outpatient Medications Prior to Visit  Medication Sig Dispense Refill   aspirin EC 81 MG tablet Take 81 mg by mouth daily.     losartan (COZAAR) 50 MG tablet Take 1 tablet by mouth daily.     metoprolol tartrate (LOPRESSOR) 50 MG tablet TAKE 1/2 TABLET BY MOUTH 2 TIMES A DAY 90 tablet 3   omeprazole (PRILOSEC) 20 MG capsule Take 20 mg by mouth 2 (two) times  daily before a meal.     ZETIA 10 MG tablet Take 10 mg by mouth daily.     atorvastatin (LIPITOR) 80 MG tablet Take 1 tablet (80 mg total) by mouth daily at 6 PM. Patient needs appointment for future refills. 1st attempt. 90 tablet 3   No facility-administered medications prior to visit.     Allergies:   Patient has no known allergies.   Social History   Socioeconomic History   Marital status: Single    Spouse name: Not on file   Number of children: Not on file   Years of education: Not on file   Highest education level: Not on file  Occupational History   Not on file  Tobacco Use   Smoking status: Never   Smokeless tobacco: Never  Vaping Use   Vaping Use: Never used  Substance and Sexual Activity   Alcohol use: No   Drug use: No   Sexual activity: Not on file  Other Topics Concern   Not on file  Social History Narrative   ** Merged History Encounter **       Social Determinants of Health   Financial Resource Strain: Not on file  Food Insecurity: Not on file  Transportation Needs: Not on file  Physical Activity: Not on file  Stress: Not on file  Social Connections: Not on file     Family History:  The patient's family history includes Diabetes in his mother; Heart disease in his father.   ROS:   Please see the history of present illness.    ROS All other systems reviewed and are negative.   PHYSICAL EXAM:   VS:  BP 128/84   Pulse (!) 54   Ht 5\' 6"  (1.676 m)   Wt 225 lb 12.8 oz (102.4 kg)   SpO2 97%   BMI 36.45 kg/m    GEN: Well nourished, obese, in no acute distress  HEENT: normal  Neck: no JVD, carotid bruits, or masses Cardiac: RRR; no murmurs, rubs, or gallops,no edema. Old Sternotomy scar Respiratory:  clear to auscultation bilaterally, normal work of breathing GI: soft, nontender, nondistended, + BS MS: no deformity or atrophy  Skin: warm and dry, no rash Neuro:  Alert and Oriented x 3, Strength and sensation are intact Psych: euthymic mood,  full affect  Wt Readings from Last 3 Encounters:  05/25/22 225 lb 12.8 oz (102.4 kg)  07/27/21 220 lb (99.8 kg)  06/30/21 220 lb (99.8 kg)      Studies/Labs Reviewed:   EKG:  EKG is ordered today.  NSR rate 54. LAD, possible LVH. No change from prior. I have personally reviewed and interpreted this study.   Recent Labs: 08/04/2021: ALT 19; BUN 11; Creatinine, Ser 0.82; Potassium 4.2; Sodium 141   Lipid Panel    Component Value Date/Time   CHOL 123 08/04/2021 1053  TRIG 105 08/04/2021 1053   HDL 38 (L) 08/04/2021 1053   CHOLHDL 3.2 08/04/2021 1053   CHOLHDL 6.1 12/18/2017 0839   VLDL 40 12/18/2017 0839   LDLCALC 65 08/04/2021 1053   Dated 10/16/18: cholesterol 123, triglycerides 96, HDL 36, LDL 68. A1c 5,5%, CMET and CBC normal Dated 01/04/21: cholesterol 138, triglycerides 119, HDL 35, LDL 82. A1c 5.4%. plts 548K otherwise CBC and CMET normal. Dated 04/11/22: cholesterol 138, triglycerides 98, HDL 38, LDL 81.   Additional studies/ records that were reviewed today include:   Cath 12/18/2017 There is moderate to severe left ventricular systolic dysfunction. LV end diastolic pressure is moderately elevated. The left ventricular ejection fraction is 25-35% by visual estimate. Mid RCA lesion is 60% stenosed. Ost 2nd Mrg lesion is 100% stenosed. Ost 1st Mrg lesion is 30% stenosed. Mid LAD lesion is 100% stenosed. Prox LAD lesion is 99% stenosed.   1.  Significant three-vessel coronary artery disease as outlined above. 2.  Severely reduced LV systolic function with an EF of 25 to 35%. 3.  Moderately elevated left ventricular end-diastolic pressure.   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  If the mid to distal LAD myocardium is viable, I think his best option is CABG.  If in the other hand there is no viable myocardium in that area, PCI of proximal LAD plus fractional flow reserve  guided revascularization of the RCA can be considered.   Echo 12/18/2017 LV EF: 40% -   45% Study Conclusions   - Left ventricle: The cavity size was normal. There was mild   concentric hypertrophy. Systolic function was mildly to   moderately reduced. The estimated ejection fraction was in the   range of 40% to 45%. There is akinesis of the apicalanterior and   apical myocardium. There is akinesis of the midanteroseptal   myocardium. - Pulmonary arteries: Systolic pressure could not be accurately   estimated.    MR VIABILITY  FINDINGS: Limited images of the lung fields showed no gross abnormalities.   Normal left ventricular size with mild LV hypertrophy. Mid to apical anterior, anteroseptal and inferoseptal hypokinesis. Apical lateral and apical inferior hypokinesis. Severe hypokinesis of the true apex. LV EF 45%. Normal right ventricular size and systolic function, EF [id number]. Normal right and left atrial sizes. Trileaflet aortic valve with no stenosis, mild aortic insufficiency. No significant mitral regurgitation noted.   Delayed enhancement imaging: Patchy <25% wall thickness subendocardial late gadolinium enhancement (LGE) in the mid to apical anterior, anteroseptal, and inferoseptal walls.   Measurements:   LVEDV 132 mL   LVSV 59 mL   LVEF 45%   RVEDV 86 mL   RVSV 51 mL   RVEF 59%   IMPRESSION: 1. Normal LV size with mild LV hypertrophy. EF 45% with wall motion abnormalities as noted above.   2.  Normal RV size and systolic function, EF [id number].   3. Delayed enhancement imaging was suggestive of viability of the hypokinetic wall segments.   Vascular surgery 12/23/2017 1.  Exploration of the right neck 2.  Repair of right internal jugular vein 3.  Repair of injury to the origin of the right vertebral artery   CABG 12/23/2017 PROCEDURE:  Procedure(s): CORONARY ARTERY BYPASS GRAFTING (CABG) x 5: Using Left Internal Mammary Artery (LIMA) and Endoscopically  Harvested Right Thigh/Calf and Left Thigh Greater Saphenous Vein Grafts (SVG);   LIMA to LAD,  SVG to Diag1, SVG to PDA, SVG to OM1 and OM2; / and Repair of Vertebral artery injury by preop line placement  and Removal of Swan Ganz Introducer (N/A) TRANSESOPHAGEAL ECHOCARDIOGRAM (TEE) (N/A)  LIMA to LAD SVG to Diag1 SVG to PDA SVG to OM1 and OM2  Echo 04/03/18: Study Conclusions   - Left ventricle: The cavity size was normal. Systolic function was   normal. The estimated ejection fraction was in the range of 50%   to 55%. Hypokinesis of the apicalanteroseptal and apical   myocardium. Left ventricular diastolic function parameters were   normal. - Aortic valve: There was mild regurgitation. - Mitral valve: There was mild regurgitation. - Left atrium: The atrium was mildly dilated.   Impressions:   - Anteroapical wall motion abnormalities persist, but have improved   since the previous study.   ASSESSMENT:    1. Coronary artery disease of bypass graft of native heart with stable angina pectoris ([place])   2. S/P CABG x 5   3. Essential hypertension   4. Hyperlipidemia, unspecified hyperlipidemia type       PLAN:  In order of problems listed above:  CAD s/pNSTEMI 2019 with severe multivessel CAD.   He underwent a CABG x5 October 2019.  He  is asymptomatic. EF has recovered.  Continue aspirin and statin. Long term need to focus on lifestyle modification. Needs to lose weight.  Hypertension: well controlled. continue metoprolol/ losartan  3    Hyperlipidemia: on Lipitor 80 mg daily. Last LDL increased to 80. On maximal lipitor dose. Given severe CAD at young age would target LDL < 55. He is now on Zetia. Repeat labs with PCP. If still not at goal would add a PCSK 9 inhibitor.   4.    Ischemic CM- pre op EF 40-45%., post revascularization EF improved to 50-55%.   5.    Obesity. Discussed weight loss strategies.   Follow up in one year    Medication Adjustments/Labs and Tests  Ordered: Current medicines are reviewed at length with the patient today.  Concerns regarding medicines are outlined above.  Medication changes, Labs and Tests ordered today are listed in the Patient Instructions below. There are no Patient Instructions on file for this visit.   Signed, Derward Marple Martin[place], MD  05/25/2022 8:32 AM    [facility] [place], [place], [place]  [address] Phone: [phone]; Fax: [phone]

## 2022-05-25 ENCOUNTER — Encounter: Payer: Self-pay | Admitting: Cardiology

## 2022-05-25 ENCOUNTER — Ambulatory Visit: Payer: BC Managed Care – PPO | Attending: Cardiology | Admitting: Cardiology

## 2022-05-25 VITALS — BP 128/84 | HR 54 | Ht 66.0 in | Wt 225.8 lb

## 2022-05-25 DIAGNOSIS — I25708 Atherosclerosis of coronary artery bypass graft(s), unspecified, with other forms of angina pectoris: Secondary | ICD-10-CM | POA: Diagnosis not present

## 2022-05-25 DIAGNOSIS — Z951 Presence of aortocoronary bypass graft: Secondary | ICD-10-CM

## 2022-05-25 DIAGNOSIS — E785 Hyperlipidemia, unspecified: Secondary | ICD-10-CM | POA: Diagnosis not present

## 2022-05-25 DIAGNOSIS — I10 Essential (primary) hypertension: Secondary | ICD-10-CM | POA: Diagnosis not present

## 2022-05-25 MED ORDER — ATORVASTATIN CALCIUM 80 MG PO TABS: 80.0000 mg | ORAL_TABLET | Freq: Every day | ORAL | 3 refills | Status: AC

## 2022-08-03 DIAGNOSIS — E785 Hyperlipidemia, unspecified: Secondary | ICD-10-CM | POA: Diagnosis not present

## 2022-11-08 DIAGNOSIS — Z0000 Encounter for general adult medical examination without abnormal findings: Secondary | ICD-10-CM | POA: Diagnosis not present

## 2022-11-08 DIAGNOSIS — E785 Hyperlipidemia, unspecified: Secondary | ICD-10-CM | POA: Diagnosis not present

## 2022-11-08 DIAGNOSIS — I10 Essential (primary) hypertension: Secondary | ICD-10-CM | POA: Diagnosis not present

## 2022-11-08 DIAGNOSIS — R7303 Prediabetes: Secondary | ICD-10-CM | POA: Diagnosis not present

## 2022-11-08 DIAGNOSIS — Z125 Encounter for screening for malignant neoplasm of prostate: Secondary | ICD-10-CM | POA: Diagnosis not present

## 2022-11-08 DIAGNOSIS — I2510 Atherosclerotic heart disease of native coronary artery without angina pectoris: Secondary | ICD-10-CM | POA: Diagnosis not present

## 2022-11-13 DIAGNOSIS — I10 Essential (primary) hypertension: Secondary | ICD-10-CM | POA: Diagnosis not present

## 2023-09-03 NOTE — Progress Notes (Signed)
 Cardiology Office Note    Date:  09/06/2023   ID:  [facility], DOB 11/28/1975, MRN [id number]  PCP:  Alben Therisa MATSU, PA  Cardiologist:  Dr. Stedman [place]  Chief Complaint  Patient presents with   Coronary Artery Disease    History of Present Illness:  [facility] is a 48 y.o. male with PMH of HTN, HLD and brain tumor s/p resection.  He was  admitted to the hospital on 12/18/2017 with a  NSTEMI.    Patient underwent cardiac catheterization on 12/18/2017 which showed EF 25 to 35%, 60% mid RCA lesion, 100% ostial OM 2 lesion, 30% ostial OM1 lesion, 100% mid LAD occlusion, 99% proximal LAD stenosis.  Bypass surgery was recommended if distal LAD territory is proven viable.  Echocardiogram obtained on the same day showed EF 40 to 45%, mild LVH, akinesis of the apical anterior and apical myocardium, akinesis of the mid anteroseptal myocardium.  MRI viability test shows there was substantial viability of the anterior wall on with only small area of scar at the apex.  Therefore the decision was made to proceed with bypass surgery.  Due to complication with sheath placement for the Norva Barton catheter was placed in the right common carotid artery, vascular surgery was called and the patient had to undergo exploration of the right neck, repair of the right internal jugular vein, and repair of the injury to the origin of the right vertebral artery on 12/23/2017.  Patient underwent CABG x5 with LIMA to LAD, SVG to diagonal, SVG to PDA and sequential SVG to OM1 and OM 2 by Dr. Army.   On follow up Echo in January 2020 there was significant improvement in LV function with EF 50-55%.  On follow up today he is doing well from a cardiac standpoint. Denies any chest pain, palpitations, dyspnea. He walks a few days a week. Mows his yard. He has gained 14 lbs since last year. Had wisdom teeth removed but otherwise no new health concerns.   Past Medical History:  Diagnosis Date   Brain tumor [facility])     Coronary artery disease    Hyperlipidemia    Hypertension    Myocardial infarction ([facility])    Seizures ([facility])     Past Surgical History:  Procedure Laterality Date   BIOPSY  05/05/2021   Procedure: BIOPSY;  Surgeon: Legrand Victory LITTIE DOUGLAS, MD;  Location: [facility] ENDOS[facility];  Service: Gastroenterology;;   BRAIN SURGERY     BRAIN SURGERY  [phone]   CORONARY ARTERY BYPASS GRAFT N/A 12/23/2017   Procedure: CORONARY ARTERY BYPASS GRAFTING (CABG) x 5: Using Left Internal Mammary Artery (LIMA) and Endoscopically Harvested Right Thigh/Calf and Left Thigh Greater Saphenous Vein Grafts (SVG);   LIMA to LAD, SVG to Diag1, SVG to PDA, SVG to OM1 and OM2; / and Repair of Vertebral Laceration and Removal of Norva Purl Introducer;  Surgeon: Army Dallas NOVAK, MD;  Location: [facility] OR;  Service: Open Hear   ESOPHAGOGASTRODUODENOSCOPY N/A 05/05/2021   Procedure: ESOPHAGOGASTRODUODENOSCOPY (EGD);  Surgeon: Legrand Victory LITTIE DOUGLAS, MD;  Location: [facility] ENDOSCOPY;  Service: Gastroenterology;  Laterality: N/A;   FOREIGN BODY REMOVAL  05/05/2021   Procedure: FOREIGN BODY REMOVAL;  Surgeon: Legrand Victory LITTIE DOUGLAS, MD;  Location: [facility] ENDOS[facility];  Service: Gastroenterology;;   LEFT HEART CATH AND CORONARY ANGIOGRAPHY N/A 12/18/2017   Procedure: LEFT HEART CATH AND CORONARY ANGIOGRAPHY;  Surgeon: Darron Deatrice LABOR, MD;  Location: [facility] INVASIVE CV LAB;  Service: Cardiovascular;  Laterality: N/A;  TEE WITHOUT CARDIOVERSION N/A 12/23/2017   Procedure: TRANSESOPHAGEAL ECHOCARDIOGRAM (TEE);  Surgeon: Army Dallas NOVAK, MD;  Location: [facility] OR;  Service: Open Heart Surgery;  Laterality: N/A;    Current Medications: Outpatient Medications Prior to Visit  Medication Sig Dispense Refill   aspirin  EC 81 MG tablet Take 81 mg by mouth daily.     atorvastatin  (LIPITOR ) 80 MG tablet Take 1 tablet (80 mg total) by mouth daily at 6 PM. 90 tablet 3   losartan  (COZAAR ) 50 MG tablet Take 1 tablet by mouth daily.     metoprolol  tartrate (LOPRESSOR ) 50 MG tablet  TAKE 1/2 TABLET BY MOUTH 2 TIMES A DAY 90 tablet 3   omeprazole (PRILOSEC) 20 MG capsule Take 20 mg daily as needed     ZETIA 10 MG tablet Take 10 mg by mouth daily.     omeprazole (PRILOSEC) 20 MG capsule Take 20 mg by mouth 2 (two) times daily before a meal.     No facility-administered medications prior to visit.     Allergies:   Patient has no known allergies.   Social History   Socioeconomic History   Marital status: Single    Spouse name: Not on file   Number of children: Not on file   Years of education: Not on file   Highest education level: Not on file  Occupational History   Not on file  Tobacco Use   Smoking status: Never   Smokeless tobacco: Never  Vaping Use   Vaping status: Never Used  Substance and Sexual Activity   Alcohol use: No   Drug use: No   Sexual activity: Not on file  Other Topics Concern   Not on file  Social History Narrative   ** Merged History Encounter **       Social Drivers of Health   Financial Resource Strain: Not on file  Food Insecurity: Not on file  Transportation Needs: Not on file  Physical Activity: Not on file  Stress: Not on file  Social Connections: Not on file     Family History:  The patient's family history includes Diabetes in his mother; Heart disease in his father.   ROS:   Please see the history of present illness.    ROS All other systems reviewed and are negative.   PHYSICAL EXAM:   VS:  BP 138/84   Pulse 60   Ht 5' 6 (1.676 m)   Wt 239 lb 2 oz (108.5 kg)   BMI 38.60 kg/m    GEN: Well nourished, obese, in no acute distress  HEENT: normal  Neck: no JVD, carotid bruits, or masses Cardiac: RRR; no murmurs, rubs, or gallops,no edema. Old Sternotomy scar Respiratory:  clear to auscultation bilaterally, normal work of breathing GI: soft, nontender, nondistended, + BS MS: no deformity or atrophy  Skin: warm and dry, no rash Neuro:  Alert and Oriented x 3, Strength and sensation are intact Psych: euthymic  mood, full affect  Wt Readings from Last 3 Encounters:  09/06/23 239 lb 2 oz (108.5 kg)  05/25/22 225 lb 12.8 oz (102.4 kg)  07/27/21 220 lb (99.8 kg)      Studies/Labs Reviewed:   EKG Interpretation Date/Time:  Friday September 06 2023 08:05:25 EDT Ventricular Rate:  60 PR Interval:  160 QRS Duration:  94 QT Interval:  434 QTC Calculation: 434 R Axis:   -45  Text Interpretation: Normal sinus rhythm Left axis deviation Minimal voltage criteria for LVH, may be normal variant (  R in aVL ) Cannot rule out Anterior infarct , age undetermined When compared with ECG of May 25, 2022  HEART RATE  has increased. Nonspecific T wave abnormality now evident in Anterior leads  Confirmed by [place], Hesston Hitchens [id number]) on 09/06/2023 8:06:56 AM     Recent Labs: No results found for requested labs within last 365 days.   Lipid Panel    Component Value Date/Time   CHOL 123 08/04/2021 1053   TRIG 105 08/04/2021 1053   HDL 38 (L) 08/04/2021 1053   CHOLHDL 3.2 08/04/2021 1053   CHOLHDL 6.1 12/18/2017 0839   VLDL 40 12/18/2017 0839   LDLCALC 65 08/04/2021 1053   Dated 10/16/18: cholesterol 123, triglycerides 96, HDL 36, LDL 68. A1c 5,5%, CMET and CBC normal Dated 01/04/21: cholesterol 138, triglycerides 119, HDL 35, LDL 82. A1c 5.4%. plts 548K otherwise CBC and CMET normal. Dated 04/11/22: cholesterol 138, triglycerides 98, HDL 38, LDL 81.  Dated 11/08/22: cholesterol 105, triglycerides 81, HDL 37, LDL 51. A1c 5.7%. CMET and CBC normal  Additional studies/ records that were reviewed today include:    Echo 04/03/18: Study Conclusions   - Left ventricle: The cavity size was normal. Systolic function was   normal. The estimated ejection fraction was in the range of 50%   to 55%. Hypokinesis of the apicalanteroseptal and apical   myocardium. Left ventricular diastolic function parameters were   normal. - Aortic valve: There was mild regurgitation. - Mitral valve: There was mild  regurgitation. - Left atrium: The atrium was mildly dilated.   Impressions:   - Anteroapical wall motion abnormalities persist, but have improved   since the previous study.   ASSESSMENT:    1. Coronary artery disease of bypass graft of native heart with stable angina pectoris ([facility])   2. Hyperlipidemia, unspecified hyperlipidemia type   3. S/P CABG x 5   4. Essential hypertension        PLAN:  In order of problems listed above:  CAD s/pNSTEMI 2019 with severe multivessel CAD.   He underwent a CABG x5 October 2019.  He  is asymptomatic. EF has recovered.  Continue aspirin  and statin. On beta blocker and ARB. Long term need to focus on lifestyle modification. Needs to lose weight.  Hypertension: well controlled. continue metoprolol / losartan   3    Hyperlipidemia: on Lipitor  80 mg daily. On Zetia 10 mg daily. Last LDL 51 is excellent.   4.    Ischemic CM- pre op EF 40-45%., post revascularization EF improved to 50-55%.   5.    Obesity. Discussed weight loss strategies.   Follow up in one year    Medication Adjustments/Labs and Tests Ordered: Current medicines are reviewed at length with the patient today.  Concerns regarding medicines are outlined above.  Medication changes, Labs and Tests ordered today are listed in the Patient Instructions below. There are no Patient Instructions on file for this visit.   Signed, Jurline Folger [place], MD  09/06/2023 8:14 AM    [facility] [address] [place], [place], [place]  [address] Phone: [phone]; Fax: [phone]

## 2023-09-06 ENCOUNTER — Encounter: Payer: Self-pay | Admitting: Cardiology

## 2023-09-06 ENCOUNTER — Ambulatory Visit: Attending: Cardiology | Admitting: Cardiology

## 2023-09-06 VITALS — BP 138/84 | HR 60 | Ht 66.0 in | Wt 239.1 lb

## 2023-09-06 DIAGNOSIS — I25708 Atherosclerosis of coronary artery bypass graft(s), unspecified, with other forms of angina pectoris: Secondary | ICD-10-CM | POA: Diagnosis not present

## 2023-09-06 DIAGNOSIS — E785 Hyperlipidemia, unspecified: Secondary | ICD-10-CM | POA: Diagnosis not present

## 2023-09-06 DIAGNOSIS — Z951 Presence of aortocoronary bypass graft: Secondary | ICD-10-CM | POA: Diagnosis not present

## 2023-09-06 DIAGNOSIS — I10 Essential (primary) hypertension: Secondary | ICD-10-CM

## 2023-09-06 NOTE — Patient Instructions (Signed)
 Medication Instructions:  Continue same medications *If you need a refill on your cardiac medications before your next appointment, please call your pharmacy*  Lab Work: None ordered  Testing/Procedures: None ordered  Follow-Up: At [facility], you and your health needs are our priority.  As part of our continuing mission to provide you with exceptional heart care, our providers are all part of one team.  This team includes your primary Cardiologist (physician) and Advanced Practice Providers or APPs (Physician Assistants and Nurse Practitioners) who all work together to provide you with the care you need, when you need it.  Your next appointment:  1 year     Call in March to schedule June appointment    Provider:  Dr.Jordan   We recommend signing up for the patient portal called MyChart.  Sign up information is provided on this After Visit Summary.  MyChart is used to connect with patients for Virtual Visits (Telemedicine).  Patients are able to view lab/test results, encounter notes, upcoming appointments, etc.  Non-urgent messages can be sent to your provider as well.   To learn more about what you can do with MyChart, go to [email or web address].
